# Patient Record
Sex: Female | Born: 1965
Health system: Southern US, Community
[De-identification: ages and names within clinical notes are randomized; demographics above are authoritative.]

## PROBLEM LIST (undated history)

## (undated) DIAGNOSIS — E119 Type 2 diabetes mellitus without complications: Secondary | ICD-10-CM

## (undated) DIAGNOSIS — I10 Essential (primary) hypertension: Secondary | ICD-10-CM

## (undated) HISTORY — PX: KNEE SURGERY: SHX244

## (undated) HISTORY — PX: TUBAL LIGATION: SHX77

---

## 1997-10-17 ENCOUNTER — Ambulatory Visit (HOSPITAL_COMMUNITY): Admission: RE | Admit: 1997-10-17 | Discharge: 1997-10-17 | Payer: Self-pay | Admitting: Cardiology

## 1997-11-23 ENCOUNTER — Other Ambulatory Visit: Admission: RE | Admit: 1997-11-23 | Discharge: 1997-11-23 | Payer: Self-pay | Admitting: Obstetrics

## 1997-12-25 ENCOUNTER — Emergency Department (HOSPITAL_COMMUNITY): Admission: EM | Admit: 1997-12-25 | Discharge: 1997-12-25 | Payer: Self-pay | Admitting: Emergency Medicine

## 1998-06-22 ENCOUNTER — Emergency Department (HOSPITAL_COMMUNITY): Admission: EM | Admit: 1998-06-22 | Discharge: 1998-06-22 | Payer: Self-pay | Admitting: Emergency Medicine

## 1998-06-24 ENCOUNTER — Encounter: Payer: Self-pay | Admitting: Emergency Medicine

## 1998-06-24 ENCOUNTER — Emergency Department (HOSPITAL_COMMUNITY): Admission: EM | Admit: 1998-06-24 | Discharge: 1998-06-24 | Payer: Self-pay | Admitting: Emergency Medicine

## 1998-12-05 ENCOUNTER — Encounter: Payer: Self-pay | Admitting: Emergency Medicine

## 1998-12-05 ENCOUNTER — Emergency Department (HOSPITAL_COMMUNITY): Admission: EM | Admit: 1998-12-05 | Discharge: 1998-12-05 | Payer: Self-pay | Admitting: Emergency Medicine

## 1999-01-02 ENCOUNTER — Ambulatory Visit (HOSPITAL_COMMUNITY): Admission: RE | Admit: 1999-01-02 | Discharge: 1999-01-02 | Payer: Self-pay | Admitting: Cardiology

## 1999-02-28 ENCOUNTER — Ambulatory Visit (HOSPITAL_COMMUNITY): Admission: RE | Admit: 1999-02-28 | Discharge: 1999-02-28 | Payer: Self-pay | Admitting: *Deleted

## 1999-03-21 ENCOUNTER — Other Ambulatory Visit: Admission: RE | Admit: 1999-03-21 | Discharge: 1999-03-21 | Payer: Self-pay | Admitting: Obstetrics

## 1999-03-31 ENCOUNTER — Emergency Department (HOSPITAL_COMMUNITY): Admission: EM | Admit: 1999-03-31 | Discharge: 1999-03-31 | Payer: Self-pay | Admitting: Internal Medicine

## 1999-04-07 ENCOUNTER — Emergency Department (HOSPITAL_COMMUNITY): Admission: EM | Admit: 1999-04-07 | Discharge: 1999-04-07 | Payer: Self-pay | Admitting: Emergency Medicine

## 1999-04-19 ENCOUNTER — Ambulatory Visit (HOSPITAL_COMMUNITY): Admission: RE | Admit: 1999-04-19 | Discharge: 1999-04-19 | Payer: Self-pay | Admitting: *Deleted

## 1999-05-09 ENCOUNTER — Encounter: Payer: Self-pay | Admitting: Emergency Medicine

## 1999-05-09 ENCOUNTER — Emergency Department (HOSPITAL_COMMUNITY): Admission: EM | Admit: 1999-05-09 | Discharge: 1999-05-09 | Payer: Self-pay | Admitting: Emergency Medicine

## 1999-07-29 ENCOUNTER — Emergency Department (HOSPITAL_COMMUNITY): Admission: EM | Admit: 1999-07-29 | Discharge: 1999-07-29 | Payer: Self-pay | Admitting: Emergency Medicine

## 2000-01-11 ENCOUNTER — Emergency Department (HOSPITAL_COMMUNITY): Admission: EM | Admit: 2000-01-11 | Discharge: 2000-01-11 | Payer: Self-pay | Admitting: Emergency Medicine

## 2000-06-10 ENCOUNTER — Other Ambulatory Visit: Admission: RE | Admit: 2000-06-10 | Discharge: 2000-06-10 | Payer: Self-pay | Admitting: Obstetrics

## 2001-01-04 ENCOUNTER — Emergency Department (HOSPITAL_COMMUNITY): Admission: EM | Admit: 2001-01-04 | Discharge: 2001-01-04 | Payer: Self-pay | Admitting: Emergency Medicine

## 2001-01-04 ENCOUNTER — Encounter: Payer: Self-pay | Admitting: Emergency Medicine

## 2001-02-05 ENCOUNTER — Emergency Department (HOSPITAL_COMMUNITY): Admission: EM | Admit: 2001-02-05 | Discharge: 2001-02-05 | Payer: Self-pay | Admitting: Emergency Medicine

## 2001-05-15 ENCOUNTER — Emergency Department (HOSPITAL_COMMUNITY): Admission: EM | Admit: 2001-05-15 | Discharge: 2001-05-16 | Payer: Self-pay | Admitting: Emergency Medicine

## 2001-05-16 ENCOUNTER — Encounter: Payer: Self-pay | Admitting: Emergency Medicine

## 2001-09-23 ENCOUNTER — Encounter: Payer: Self-pay | Admitting: Emergency Medicine

## 2001-09-23 ENCOUNTER — Emergency Department (HOSPITAL_COMMUNITY): Admission: EM | Admit: 2001-09-23 | Discharge: 2001-09-23 | Payer: Self-pay | Admitting: Emergency Medicine

## 2002-01-22 ENCOUNTER — Encounter: Payer: Self-pay | Admitting: Emergency Medicine

## 2002-01-22 ENCOUNTER — Inpatient Hospital Stay (HOSPITAL_COMMUNITY): Admission: AD | Admit: 2002-01-22 | Discharge: 2002-01-24 | Payer: Self-pay | Admitting: Obstetrics

## 2002-01-22 ENCOUNTER — Encounter: Payer: Self-pay | Admitting: Obstetrics

## 2004-03-25 ENCOUNTER — Encounter: Admission: RE | Admit: 2004-03-25 | Discharge: 2004-03-25 | Payer: Self-pay | Admitting: Orthopedic Surgery

## 2004-03-26 ENCOUNTER — Encounter: Admission: RE | Admit: 2004-03-26 | Discharge: 2004-03-26 | Payer: Self-pay | Admitting: Orthopedic Surgery

## 2004-04-18 ENCOUNTER — Encounter: Admission: RE | Admit: 2004-04-18 | Discharge: 2004-07-17 | Payer: Self-pay | Admitting: Orthopedic Surgery

## 2004-05-10 ENCOUNTER — Ambulatory Visit (HOSPITAL_COMMUNITY): Admission: RE | Admit: 2004-05-10 | Discharge: 2004-05-10 | Payer: Self-pay | Admitting: Physician Assistant

## 2004-05-29 ENCOUNTER — Emergency Department (HOSPITAL_COMMUNITY): Admission: EM | Admit: 2004-05-29 | Discharge: 2004-05-29 | Payer: Self-pay | Admitting: Emergency Medicine

## 2004-10-04 ENCOUNTER — Emergency Department (HOSPITAL_COMMUNITY): Admission: EM | Admit: 2004-10-04 | Discharge: 2004-10-04 | Payer: Self-pay | Admitting: Emergency Medicine

## 2004-11-23 ENCOUNTER — Emergency Department (HOSPITAL_COMMUNITY): Admission: EM | Admit: 2004-11-23 | Discharge: 2004-11-23 | Payer: Self-pay | Admitting: Emergency Medicine

## 2005-02-05 ENCOUNTER — Emergency Department (HOSPITAL_COMMUNITY): Admission: EM | Admit: 2005-02-05 | Discharge: 2005-02-05 | Payer: Self-pay | Admitting: Emergency Medicine

## 2005-03-05 ENCOUNTER — Encounter: Admission: RE | Admit: 2005-03-05 | Discharge: 2005-03-05 | Payer: Self-pay | Admitting: Cardiology

## 2006-02-20 ENCOUNTER — Emergency Department (HOSPITAL_COMMUNITY): Admission: EM | Admit: 2006-02-20 | Discharge: 2006-02-21 | Payer: Self-pay | Admitting: Emergency Medicine

## 2006-03-19 ENCOUNTER — Encounter: Admission: RE | Admit: 2006-03-19 | Discharge: 2006-03-19 | Payer: Self-pay | Admitting: Cardiology

## 2006-12-18 ENCOUNTER — Emergency Department (HOSPITAL_COMMUNITY): Admission: EM | Admit: 2006-12-18 | Discharge: 2006-12-18 | Payer: Self-pay | Admitting: Emergency Medicine

## 2007-06-22 ENCOUNTER — Emergency Department (HOSPITAL_COMMUNITY): Admission: EM | Admit: 2007-06-22 | Discharge: 2007-06-22 | Payer: Self-pay | Admitting: Emergency Medicine

## 2007-09-13 ENCOUNTER — Emergency Department (HOSPITAL_COMMUNITY): Admission: EM | Admit: 2007-09-13 | Discharge: 2007-09-13 | Payer: Self-pay | Admitting: Emergency Medicine

## 2008-05-19 ENCOUNTER — Emergency Department (HOSPITAL_COMMUNITY): Admission: EM | Admit: 2008-05-19 | Discharge: 2008-05-19 | Payer: Self-pay | Admitting: Emergency Medicine

## 2008-08-11 ENCOUNTER — Emergency Department (HOSPITAL_COMMUNITY): Admission: EM | Admit: 2008-08-11 | Discharge: 2008-08-11 | Payer: Self-pay | Admitting: Emergency Medicine

## 2010-01-10 ENCOUNTER — Emergency Department (HOSPITAL_COMMUNITY): Admission: EM | Admit: 2010-01-10 | Discharge: 2010-01-10 | Payer: Self-pay | Admitting: Family Medicine

## 2010-04-04 ENCOUNTER — Emergency Department (HOSPITAL_COMMUNITY): Admission: EM | Admit: 2010-04-04 | Discharge: 2010-04-04 | Payer: Self-pay | Admitting: Emergency Medicine

## 2010-08-04 ENCOUNTER — Encounter: Payer: Self-pay | Admitting: Orthopedic Surgery

## 2010-08-04 ENCOUNTER — Encounter: Payer: Self-pay | Admitting: Obstetrics

## 2010-08-04 ENCOUNTER — Encounter: Payer: Self-pay | Admitting: Cardiology

## 2010-09-26 LAB — URINALYSIS, ROUTINE W REFLEX MICROSCOPIC
Hgb urine dipstick: NEGATIVE
Nitrite: NEGATIVE
Protein, ur: NEGATIVE mg/dL
pH: 6 (ref 5.0–8.0)

## 2010-09-29 LAB — POCT RAPID STREP A (OFFICE): Streptococcus, Group A Screen (Direct): POSITIVE — AB

## 2010-10-15 ENCOUNTER — Emergency Department (HOSPITAL_COMMUNITY)
Admission: EM | Admit: 2010-10-15 | Discharge: 2010-10-15 | Disposition: A | Discharge status: Home / Self Care | Payer: Medicaid Other | Attending: Emergency Medicine | Admitting: Emergency Medicine

## 2010-10-15 DIAGNOSIS — R110 Nausea: Secondary | ICD-10-CM | POA: Insufficient documentation

## 2010-10-15 DIAGNOSIS — R51 Headache: Secondary | ICD-10-CM | POA: Insufficient documentation

## 2010-11-11 ENCOUNTER — Emergency Department (HOSPITAL_COMMUNITY)
Admission: EM | Admit: 2010-11-11 | Discharge: 2010-11-11 | Disposition: A | Discharge status: Home / Self Care | Payer: Medicaid Other | Attending: Emergency Medicine | Admitting: Emergency Medicine

## 2010-11-11 DIAGNOSIS — R10816 Epigastric abdominal tenderness: Secondary | ICD-10-CM | POA: Insufficient documentation

## 2010-11-11 DIAGNOSIS — R6883 Chills (without fever): Secondary | ICD-10-CM | POA: Insufficient documentation

## 2010-11-11 DIAGNOSIS — K5289 Other specified noninfective gastroenteritis and colitis: Secondary | ICD-10-CM | POA: Insufficient documentation

## 2010-11-11 LAB — COMPREHENSIVE METABOLIC PANEL
ALT: 23 U/L (ref 0–35)
Albumin: 3.7 g/dL (ref 3.5–5.2)
Alkaline Phosphatase: 52 U/L (ref 39–117)
Calcium: 9.1 mg/dL (ref 8.4–10.5)
Chloride: 105 mEq/L (ref 96–112)
Creatinine, Ser: 0.87 mg/dL (ref 0.4–1.2)
GFR calc non Af Amer: >60 mL/min (ref >60)
Potassium: 3.8 mEq/L (ref 3.5–5.1)
Total Bilirubin: 0.7 mg/dL (ref 0.3–1.2)

## 2010-11-11 LAB — CBC
MCH: 25.3 pg — ABNORMAL LOW (ref 26.0–34.0)
MCV: 77.3 fL — ABNORMAL LOW (ref 78.0–100.0)
Platelets: 252 10*3/uL (ref 150–400)
RBC: 4.59 MIL/uL (ref 3.87–5.11)
RDW: 14.9 % (ref 11.5–15.5)

## 2010-11-11 LAB — URINALYSIS, ROUTINE W REFLEX MICROSCOPIC
Bilirubin Urine: NEGATIVE
Glucose, UA: NEGATIVE mg/dL
Ketones, ur: NEGATIVE mg/dL
Nitrite: NEGATIVE
Protein, ur: NEGATIVE mg/dL
pH: 7.5 (ref 5.0–8.0)

## 2010-11-11 LAB — DIFFERENTIAL
Basophils Absolute: 0 10*3/uL (ref 0.0–0.1)
Eosinophils Absolute: 0 10*3/uL (ref 0.0–0.7)
Eosinophils Relative: 1 % (ref 0–5)
Lymphs Abs: 1 10*3/uL (ref 0.7–4.0)
Monocytes Absolute: 0.6 10*3/uL (ref 0.1–1.0)
Neutro Abs: 5.8 10*3/uL (ref 1.7–7.7)
Neutrophils Relative %: 79 % — ABNORMAL HIGH (ref 43–77)

## 2010-11-11 LAB — POCT PREGNANCY, URINE: Preg Test, Ur: NEGATIVE

## 2010-11-29 NOTE — Discharge Summary (Signed)
   NAMEALIZAYA, OSHEA NO.:  [id number]   MEDICAL RECORD NO.:  [id number]                   PATIENT TYPE:  INP   LOCATION:  9143                                 FACILITY:  [facility]   PHYSICIAN:  Kathreen Cosier, M.D.           DATE OF BIRTH:  1966/06/24   DATE OF ADMISSION:  01/22/2002  DATE OF DISCHARGE:  01/24/2002                                 DISCHARGE SUMMARY   The patient is a 45 year old female. Last normal period 11/08/01.  She had a  tubal ligation in the past.  She was admitted because of a temperature of  100.5 from 103.  She was started on Unasyn IV every 6 hours. She had  abdominal tenderness. She had a CT which was negative.  Ultrasound  suggesting possible endometritis. Appendicitis was ruled out.   Her hemoglobin was 10.8 on admission. White count 4.  Platelets 206. Sodium  140. Potassium 3.8.  Chloride 105.  Glucose 81.  CG and Chlamydia cultures  were negative.   She rapidly defervesced.  The abdomen became soft and she did well.  She was  discharged on 01/24/02 on Cleocin 300 mg p.o. q.6h. and Darvocet-N 100 mg  every 3-4 hours to see me in 1 week.   DISCHARGE DIAGNOSIS:  Status post endometritis.                                               Kathreen Cosier, M.D.    BAM/MEDQ  D:  03/09/2002  T:  03/10/2002  Job:  [id number]

## 2011-02-25 ENCOUNTER — Emergency Department (HOSPITAL_COMMUNITY)
Admission: EM | Admit: 2011-02-25 | Discharge: 2011-02-25 | Disposition: A | Discharge status: Home / Self Care | Payer: Medicaid Other | Attending: Emergency Medicine | Admitting: Emergency Medicine

## 2011-02-25 ENCOUNTER — Emergency Department (HOSPITAL_COMMUNITY): Payer: Medicaid Other

## 2011-02-25 DIAGNOSIS — R070 Pain in throat: Secondary | ICD-10-CM | POA: Insufficient documentation

## 2011-02-25 DIAGNOSIS — M542 Cervicalgia: Secondary | ICD-10-CM | POA: Insufficient documentation

## 2011-04-07 LAB — CBC
HCT: 33.7 — ABNORMAL LOW
Hemoglobin: 11.3 — ABNORMAL LOW
Platelets: 250
WBC: 5.4

## 2011-04-07 LAB — DIFFERENTIAL
Eosinophils Relative: 2
Lymphocytes Relative: 30
Lymphs Abs: 1.6
Monocytes Absolute: 0.8
Neutro Abs: 3

## 2011-04-07 LAB — BASIC METABOLIC PANEL
Calcium: 8.6
GFR calc non Af Amer: >60
Potassium: 3.1 — ABNORMAL LOW
Sodium: 136

## 2011-04-16 LAB — WET PREP, GENITAL

## 2011-04-16 LAB — DIFFERENTIAL
Basophils Absolute: 0
Basophils Relative: 0
Lymphocytes Relative: 32
Monocytes Absolute: 0.4
Neutro Abs: 2.9
Neutrophils Relative %: 58

## 2011-04-16 LAB — COMPREHENSIVE METABOLIC PANEL
Albumin: 3.6
Alkaline Phosphatase: 57
BUN: 7
Chloride: 106
Creatinine, Ser: 0.87
GFR calc non Af Amer: >60
Glucose, Bld: 89
Potassium: 3.5
Total Bilirubin: 0.9

## 2011-04-16 LAB — CBC
HCT: 32.4 — ABNORMAL LOW
Hemoglobin: 10.6 — ABNORMAL LOW
MCV: 77.1 — ABNORMAL LOW
Platelets: 263
WBC: 5

## 2011-04-16 LAB — URINE MICROSCOPIC-ADD ON

## 2011-04-16 LAB — PREGNANCY, URINE: Preg Test, Ur: NEGATIVE

## 2011-04-16 LAB — GC/CHLAMYDIA PROBE AMP, GENITAL: GC Probe Amp, Genital: NEGATIVE

## 2011-04-16 LAB — RPR: RPR Ser Ql: NONREACTIVE

## 2011-04-16 LAB — URINALYSIS, ROUTINE W REFLEX MICROSCOPIC
Glucose, UA: NEGATIVE
Ketones, ur: NEGATIVE
Protein, ur: NEGATIVE
Urobilinogen, UA: 0.2

## 2011-04-16 LAB — LIPASE, BLOOD: Lipase: 24

## 2011-05-01 LAB — GC/CHLAMYDIA PROBE AMP, GENITAL
Chlamydia, DNA Probe: NEGATIVE
GC Probe Amp, Genital: NEGATIVE

## 2011-05-01 LAB — BASIC METABOLIC PANEL
BUN: 3 — ABNORMAL LOW
CO2: 26
Calcium: 8.9
Chloride: 107
Creatinine, Ser: 0.89
GFR calc Af Amer: >60
GFR calc non Af Amer: >60
Glucose, Bld: 86
Potassium: 3.6
Sodium: 139

## 2011-05-01 LAB — URINALYSIS, ROUTINE W REFLEX MICROSCOPIC
Bilirubin Urine: NEGATIVE
Glucose, UA: NEGATIVE
Hgb urine dipstick: NEGATIVE
Ketones, ur: NEGATIVE
Nitrite: NEGATIVE
Protein, ur: NEGATIVE
Specific Gravity, Urine: 1.014
Urobilinogen, UA: 0.2
pH: 6

## 2011-05-01 LAB — DIFFERENTIAL
Basophils Absolute: 0
Basophils Relative: 1
Eosinophils Absolute: 0.1
Eosinophils Relative: 1
Lymphocytes Relative: 31
Lymphs Abs: 1.8
Monocytes Absolute: 0.6
Monocytes Relative: 11
Neutro Abs: 3.4
Neutrophils Relative %: 58

## 2011-05-01 LAB — RPR: RPR Ser Ql: NONREACTIVE

## 2011-05-01 LAB — CBC
HCT: 31.6 — ABNORMAL LOW
Hemoglobin: 10.4 — ABNORMAL LOW
MCHC: 33
MCV: 77.1 — ABNORMAL LOW
Platelets: 265
RBC: 4.11
RDW: 15.8 — ABNORMAL HIGH
WBC: 6

## 2011-05-01 LAB — PREGNANCY, URINE: Preg Test, Ur: NEGATIVE

## 2011-05-01 LAB — WET PREP, GENITAL
Trich, Wet Prep: NONE SEEN
Yeast Wet Prep HPF POC: NONE SEEN

## 2011-09-06 ENCOUNTER — Encounter (HOSPITAL_COMMUNITY): Payer: Self-pay | Admitting: Emergency Medicine

## 2011-09-06 ENCOUNTER — Emergency Department (HOSPITAL_COMMUNITY)
Admission: EM | Admit: 2011-09-06 | Discharge: 2011-09-07 | Disposition: A | Discharge status: Home / Self Care | Payer: Medicaid Other | Attending: Emergency Medicine | Admitting: Emergency Medicine

## 2011-09-06 DIAGNOSIS — T169XXA Foreign body in ear, unspecified ear, initial encounter: Secondary | ICD-10-CM | POA: Insufficient documentation

## 2011-09-06 DIAGNOSIS — IMO0002 Reserved for concepts with insufficient information to code with codable children: Secondary | ICD-10-CM | POA: Insufficient documentation

## 2011-09-06 MED ORDER — ANTIPYRINE-BENZOCAINE 5.4-1.4 % OT SOLN: 3.0000 [drp] | Freq: Once | OTIC | Status: DC

## 2011-09-06 NOTE — ED Notes (Signed)
Pt began having pain to L ear about 30 min ago, insect visible with otoscope. Small amount blood noted

## 2011-09-06 NOTE — ED Notes (Signed)
L ear irrigated with sterile water, @100mls . Pt tolerated well. Insect did not appear to move.

## 2011-09-07 MED ORDER — LIDOCAINE HCL 2 % IJ SOLN
INTRAMUSCULAR | Status: AC
  Administered 2011-09-07: 01:00:00
  Filled 2011-09-07: qty 1

## 2011-09-07 MED ORDER — NEOMYCIN-POLYMYXIN-HC 3.5-10000-1 OT SUSP: 4.0000 [drp] | Freq: Three times a day (TID) | OTIC | Status: AC

## 2011-09-07 MED ORDER — ANTIPYRINE-BENZOCAINE 5.4-1.4 % OT SOLN: 3.0000 [drp] | OTIC | Status: AC | PRN

## 2011-09-07 NOTE — ED Provider Notes (Signed)
History     CSN: [id number]  Arrival date & time 09/06/11  2212   First MD Initiated Contact with Patient 09/06/11 2255      Chief Complaint  Patient presents with  . Foreign Body in Ear    (Consider location/radiation/quality/duration/timing/severity/associated sxs/prior treatment) HPI  Pt came to the ED with complaints of cockroach in ear. She was ate her grandmothers house helping her move and she was too tired to go home and laid down. She has been trying to get it out herself and has scratched her ear and is now having a lot of pain.  Past Medical History  Diagnosis Date  . Asthma     Past Surgical History  Procedure Date  . Tubal ligation   . Knee surgery     No family history on file.  History  Substance Use Topics  . Smoking status: Never Smoker   . Smokeless tobacco: Not on file  . Alcohol Use: Yes     occasional    OB History    Grav Para Term Preterm Abortions TAB SAB Ect Mult Living                  Review of Systems  All other systems reviewed and are negative.    Allergies  Codeine and Toradol  Home Medications   Current Outpatient Rx  Name Route Sig Dispense Refill  . ANTIPYRINE-BENZOCAINE 5.4-1.4 % OT SOLN Left Ear Place 3 drops into the left ear every 2 (two) hours as needed for pain. 10 mL 0  . NEOMYCIN-POLYMYXIN-HC 3.5-10000-1 OT SUSP Left Ear Place 4 drops into the left ear 3 (three) times daily. 7.5 mL 0    BP 146/81  Pulse 83  Temp(Src) 98.6 F (37 C) (Oral)  Resp 18  Wt 260 lb (117.935 kg)  SpO2 100%  LMP 07/22/2011  Physical Exam  Nursing note and vitals reviewed. Constitutional: She appears well-developed and well-nourished. No distress.  HENT:  Head: Normocephalic and atraumatic.  Right Ear: Hearing, tympanic membrane and external ear normal.  Left Ear: No lacerations. There is swelling and tenderness. A foreign body is present. Tympanic membrane is not injected, not perforated and not retracted. Left ear middle  ear effusion: cockroach against eardrum. Decreased hearing is noted.  Eyes: Pupils are equal, round, and reactive to light.  Neck: Normal range of motion. Neck supple.  Cardiovascular: Normal rate and regular rhythm.   Pulmonary/Chest: Effort normal.  Abdominal: Soft.  Neurological: She is alert.  Skin: Skin is warm and dry.    ED Course  FOREIGN BODY REMOVAL Date/Time: 09/07/2011 12:32 AM Performed by: Dorthula Matas Authorized by: Dorthula Matas Consent: Verbal consent obtained. Risks and benefits: risks, benefits and alternatives were discussed Consent given by: patient Patient understanding: patient states understanding of the procedure being performed Patient identity confirmed: verbally with patient Body area: ear Anesthesia method: topical. Local anesthetic: lidocaine 2% without epinephrine Anesthetic total: 3 ml Patient sedated: no Patient restrained: no 1 objects recovered. Objects recovered: cockroach Post-procedure assessment: foreign body removed   (including critical care time)  Labs Reviewed - No data to display No results found.   1. Foreign body in ear       MDM   Cockroach removed. I have given patient cortisporin drops and auralgan drops. Pt also given referral to ENT Dr. Annalee Genta in case she develops any complications.       Dorthula Matas, PA 09/07/11 [phone]

## 2011-09-07 NOTE — Discharge Instructions (Signed)
Ear, Foreign Body  It is common for young children to put objects into the ear canal. These may include pebbles, beads, beans, and any other small objects which will fit. In adults, objects such as cotton swabs used to relieve itching or soreness in the ear may become lodged (get stuck in) in the ear canal. In all ages, the most common foreign bodies are insects that enter the ear canal. Foreign bodies may cause pain, buzzing or roaring sounds, hearing loss, and ear drainage.   HOME CARE INSTRUCTIONS   · Once the caregiver removes the object from the ear, there are usually no further problems.   · Keep small objects out of reach of young children. Tell your children not to put object into ears, and that if it happens again, to tell you or another adult immediately.   SEEK IMMEDIATE MEDICAL CARE IF:   · There is bleeding from the ear or increased pain and swelling.   · There is reduced hearing.   · Pain and discharge from the ear continues. This may be a sign of infection or may suggest that the object was not completely removed.   · Another small object gets stuck in the ear canal. Do not try to remove it with tweezers, a finger, or stick anything into the ear. Such actions may cause the object to be pushed farther in and make it more difficult to remove.   · Headache, fever, or other serious problems develop.   MAKE SURE YOU:   · Understand these instructions.   · Will watch your condition.   · Will get help right away if you are not doing well or get worse.   Document Released: 06/27/2000 Document Revised: 03/12/2011 Document Reviewed: 02/16/2008  [employer]® Patient Information ©2012 [employer].

## 2011-09-07 NOTE — ED Provider Notes (Signed)
Medical screening examination/treatment/procedure(s) were performed by non-physician practitioner and as supervising physician I was immediately available for consultation/collaboration.  Juliet Rude. Rubin Payor, MD 09/07/11 [phone]

## 2011-10-20 ENCOUNTER — Encounter (HOSPITAL_COMMUNITY): Payer: Self-pay

## 2011-10-20 ENCOUNTER — Emergency Department (HOSPITAL_COMMUNITY)
Admission: EM | Admit: 2011-10-20 | Discharge: 2011-10-20 | Disposition: A | Discharge status: Home / Self Care | Payer: Medicaid Other | Attending: Emergency Medicine | Admitting: Emergency Medicine

## 2011-10-20 DIAGNOSIS — R10819 Abdominal tenderness, unspecified site: Secondary | ICD-10-CM | POA: Insufficient documentation

## 2011-10-20 DIAGNOSIS — R197 Diarrhea, unspecified: Secondary | ICD-10-CM | POA: Insufficient documentation

## 2011-10-20 DIAGNOSIS — R112 Nausea with vomiting, unspecified: Secondary | ICD-10-CM | POA: Insufficient documentation

## 2011-10-20 DIAGNOSIS — R109 Unspecified abdominal pain: Secondary | ICD-10-CM | POA: Insufficient documentation

## 2011-10-20 HISTORY — DX: Essential (primary) hypertension: I10

## 2011-10-20 LAB — DIFFERENTIAL
Lymphocytes Relative: 11 % — ABNORMAL LOW (ref 12–46)
Lymphs Abs: 0.7 10*3/uL (ref 0.7–4.0)
Monocytes Relative: 6 % (ref 3–12)
Neutro Abs: 4.9 10*3/uL (ref 1.7–7.7)
Neutrophils Relative %: 82 % — ABNORMAL HIGH (ref 43–77)

## 2011-10-20 LAB — COMPREHENSIVE METABOLIC PANEL
ALT: 15 U/L (ref 0–35)
Alkaline Phosphatase: 56 U/L (ref 39–117)
BUN: 7 mg/dL (ref 6–23)
Chloride: 104 mEq/L (ref 96–112)
GFR calc Af Amer: >90 mL/min (ref >90)
Glucose, Bld: 124 mg/dL — ABNORMAL HIGH (ref 70–99)
Potassium: 3.4 mEq/L — ABNORMAL LOW (ref 3.5–5.1)
Sodium: 139 mEq/L (ref 135–145)
Total Bilirubin: 0.7 mg/dL (ref 0.3–1.2)

## 2011-10-20 LAB — URINALYSIS, ROUTINE W REFLEX MICROSCOPIC
Bilirubin Urine: NEGATIVE
Hgb urine dipstick: NEGATIVE
Ketones, ur: NEGATIVE mg/dL
Nitrite: NEGATIVE
Urobilinogen, UA: 0.2 mg/dL (ref 0.0–1.0)
pH: 8 (ref 5.0–8.0)

## 2011-10-20 LAB — CBC
Hemoglobin: 11.7 g/dL — ABNORMAL LOW (ref 12.0–15.0)
MCH: 23.8 pg — ABNORMAL LOW (ref 26.0–34.0)
RBC: 4.92 MIL/uL (ref 3.87–5.11)
WBC: 6 10*3/uL (ref 4.0–10.5)

## 2011-10-20 LAB — LIPASE, BLOOD: Lipase: 19 U/L (ref 11–59)

## 2011-10-20 MED ORDER — ONDANSETRON HCL 4 MG/2ML IJ SOLN
4.0000 mg | Freq: Once | INTRAMUSCULAR | Status: AC
  Administered 2011-10-20: 4 mg via INTRAVENOUS
  Filled 2011-10-20: qty 2

## 2011-10-20 MED ORDER — MORPHINE SULFATE 4 MG/ML IJ SOLN
4.0000 mg | Freq: Once | INTRAMUSCULAR | Status: AC
  Administered 2011-10-20: 4 mg via INTRAVENOUS
  Filled 2011-10-20: qty 1

## 2011-10-20 MED ORDER — HYOSCYAMINE SULFATE 0.125 MG PO TABS
0.2500 mg | ORAL_TABLET | Freq: Once | ORAL | Status: AC
  Administered 2011-10-20: 0.25 mg via ORAL
  Filled 2011-10-20: qty 2

## 2011-10-20 MED ORDER — PROMETHAZINE HCL 12.5 MG PO TABS: 12.5000 mg | ORAL_TABLET | Freq: Four times a day (QID) | ORAL | Status: DC | PRN

## 2011-10-20 MED ORDER — SODIUM CHLORIDE 0.9 % IV BOLUS (SEPSIS)
1000.0000 mL | Freq: Once | INTRAVENOUS | Status: AC
  Administered 2011-10-20: 1000 mL via INTRAVENOUS

## 2011-10-20 NOTE — ED Notes (Signed)
Pt given po fluids tolerating well thus far

## 2011-10-20 NOTE — ED Notes (Signed)
Pt. Stated she was unable to urinate

## 2011-10-20 NOTE — ED Notes (Signed)
Pt. Was put in a gown ,on monitor

## 2011-10-20 NOTE — ED Notes (Signed)
Pt reports that she is having nausea/vomitting/diarrhea since last night at 1900.  Abdominal pain around belly button, describes aching and sharp pains.

## 2011-10-20 NOTE — Discharge Instructions (Signed)
I think your symptoms are caused by a viral infection such as noro virus. Make sure to drink plenty of fluids. Rest. Take phenergan as prescribed for nausea. Follow up with your doctor in 2-3 days. Return if abdominal pain is worsening or unable to tolerate oral fluids and medications.   Norovirus Infection Norovirus illness is caused by a viral infection. The term norovirus refers to a group of viruses. Any of those viruses can cause norovirus illness. This illness is often referred to by other names such as viral gastroenteritis, stomach flu, and food poisoning. Anyone can get a norovirus infection. People can have the illness multiple times during their lifetime. CAUSES  Norovirus is found in the stool or vomit of infected people. It is easily spread from person to person (contagious). People with norovirus are contagious from the moment they begin feeling ill. They may remain contagious for as long as 3 days to 2 weeks after recovery. People can become infected with the virus in several ways. This includes:  Eating food or drinking liquids that are contaminated with norovirus.   Touching surfaces or objects contaminated with norovirus, and then placing your hand in your mouth.   Having direct contact with a person who is infected and shows symptoms. This may occur while caring for someone with illness or while sharing foods or eating utensils with someone who is ill.  SYMPTOMS  Symptoms usually begin 1 to 2 days after ingestion of the virus. Symptoms may include:  Nausea.   Vomiting.   Diarrhea.   Stomach cramps.   Low-grade fever.   Chills.   Headache.   Muscle aches.   Tiredness.  Most people with norovirus illness get better within 1 to 2 days. Some people become dehydrated because they cannot drink enough liquids to replace those lost from vomiting and diarrhea. This is especially true for young children, the elderly, and others who are unable to care for  themselves. DIAGNOSIS  Diagnosis is based on your symptoms and exam. Currently, only state public health laboratories have the ability to test for norovirus in stool or vomit. TREATMENT  No specific treatment exists for norovirus infections. No vaccine is available to prevent infections. Norovirus illness is usually brief in healthy people. If you are ill with vomiting and diarrhea, you should drink enough water and fluids to keep your urine clear or pale yellow. Dehydration is the most serious health effect that can result from this infection. By drinking oral rehydration solution (ORS), people can reduce their chance of becoming dehydrated. There are many commercially available pre-made and powdered ORS designed to safely rehydrate people. These may be recommended by your caregiver. Replace any new fluid losses from diarrhea or vomiting with ORS as follows:  If your child weighs 10 kg or less (22 lb or less), give 60 to 120 ml ( to  cup or 2 to 4 oz) of ORS for each diarrheal stool or vomiting episode.   If your child weighs more than 10 kg (more than 22 lb), give 120 to 240 ml ( to 1 cup or 4 to 8 oz) of ORS for each diarrheal stool or vomiting episode.  HOME CARE INSTRUCTIONS   Follow all your caregiver's instructions.   Avoid sugar-free and alcoholic drinks while ill.   Only take over-the-counter or prescription medicines for pain, vomiting, diarrhea, or fever as directed by your caregiver.  You can decrease your chances of coming in contact with norovirus or spreading it by following these steps:  Frequently wash your hands, especially after using the toilet, changing diapers, and before eating or preparing food.   Carefully wash fruits and vegetables. Cook shellfish before eating them.   Do not prepare food for others while you are infected and for at least 3 days after recovering from illness.   Thoroughly clean and disinfect contaminated surfaces immediately after an episode of  illness using a bleach-based household cleaner.   Immediately remove and wash clothing or linens that may be contaminated with the virus.   Use the toilet to dispose of any vomit or stool. Make sure the surrounding area is kept clean.   Food that may have been contaminated by an ill person should be discarded.  SEEK IMMEDIATE MEDICAL CARE IF:   You develop symptoms of dehydration that do not improve with fluid replacement. This may include:   Excessive sleepiness.   Lack of tears.   Dry mouth.   Dizziness when standing.   Weak pulse.  Document Released: 09/20/2002 Document Revised: 06/19/2011 Document Reviewed: 10/22/2009 [facility] Patient Information 2012 [place], [place].

## 2011-10-20 NOTE — ED Provider Notes (Signed)
History     CSN: [id number]  Arrival date & time 10/20/11  8469   First MD Initiated Contact with Patient 10/20/11 0559      Chief Complaint  Patient presents with  . Vomiting    (Consider location/radiation/quality/duration/timing/severity/associated sxs/prior treatment) Patient is a 46 y.o. female presenting with vomiting. The history is provided by the patient.  Emesis  This is a new problem. The current episode started 6 to 12 hours ago. Associated symptoms include abdominal pain and diarrhea. Pertinent negatives include no chills and no fever.  Pt states around 7pm last night, she began having abdominal cramping, nausea, vomiting. States around 9pm, developed watery diarrhea. Pt states She has been vomiting all night, unable to keep anything down. States tried taking tylenol, but vomited. Continues to have abdominal pain, states pain is mainly around her umbilicus and epigastric area. Denies fever, admits to chills. States feels weak and dizzy. No hx of the same. No recent sick contacts  Past Medical History  Diagnosis Date  . Asthma   . Hypertension     Past Surgical History  Procedure Date  . Tubal ligation   . Knee surgery     No family history on file.  History  Substance Use Topics  . Smoking status: Never Smoker   . Smokeless tobacco: Not on file  . Alcohol Use: Yes     occasional    OB History    Grav Para Term Preterm Abortions TAB SAB Ect Mult Living                  Review of Systems  Constitutional: Negative for fever and chills.  HENT: Negative.   Eyes: Negative.   Respiratory: Negative.   Cardiovascular: Negative.   Gastrointestinal: Positive for nausea, abdominal pain and diarrhea.  Genitourinary: Negative.   Musculoskeletal: Negative.   Skin: Negative.   Neurological: Negative.   Psychiatric/Behavioral: Negative.     Allergies  Codeine and Toradol  Home Medications  No current outpatient prescriptions on file.  BP 152/104  Pulse  87  Temp(Src) 98.1 F (36.7 C) (Oral)  Resp 19  Ht 5\' 5"  (1.651 m)  Wt 225 lb (102.059 kg)  BMI 37.44 kg/m2  SpO2 100%  LMP 09/26/2011  Physical Exam  Nursing note and vitals reviewed. Constitutional: She is oriented to person, place, and time. She appears well-developed and well-nourished.       Obese, vomiting  HENT:  Head: Normocephalic.  Eyes: Conjunctivae are normal.  Neck: Neck supple.  Cardiovascular: Normal rate, regular rhythm and normal heart sounds.   Pulmonary/Chest: Effort normal and breath sounds normal. No respiratory distress. She has no wheezes. She has no rales.  Abdominal: Soft. Bowel sounds are normal. There is tenderness.       Tender in all four quadrants. No guarding or rebound tenderness  Musculoskeletal: She exhibits no edema.  Neurological: She is alert and oriented to person, place, and time.  Skin: Skin is warm and dry.  Psychiatric: She has a normal mood and affect.    ED Course  Procedures (including critical care time)  Pt with abdominal pain, nausea, vomiting, diarrhea. Will get labs. Abdomen diffusely tender, soft, no guarding. Will hydrate, zofran ordered.   Results for orders placed during the hospital encounter of 10/20/11  CBC      Component Value Range   WBC 6.0  4.0 - 10.5 (K/uL)   RBC 4.92  3.87 - 5.11 (MIL/uL)   Hemoglobin 11.7 (*) 12.0 -  15.0 (g/dL)   HCT 13.0 (*) 86.5 - 46.0 (%)   MCV 72.8 (*) 78.0 - 100.0 (fL)   MCH 23.8 (*) 26.0 - 34.0 (pg)   MCHC 32.7  30.0 - 36.0 (g/dL)   RDW 78.4  69.6 - 29.5 (%)   Platelets 229  150 - 400 (K/uL)  DIFFERENTIAL      Component Value Range   Neutrophils Relative 82 (*) 43 - 77 (%)   Neutro Abs 4.9  1.7 - 7.7 (K/uL)   Lymphocytes Relative 11 (*) 12 - 46 (%)   Lymphs Abs 0.7  0.7 - 4.0 (K/uL)   Monocytes Relative 6  3 - 12 (%)   Monocytes Absolute 0.4  0.1 - 1.0 (K/uL)   Eosinophils Relative 0  0 - 5 (%)   Eosinophils Absolute 0.0  0.0 - 0.7 (K/uL)   Basophils Relative 0  0 - 1 (%)    Basophils Absolute 0.0  0.0 - 0.1 (K/uL)  COMPREHENSIVE METABOLIC PANEL      Component Value Range   Sodium 139  135 - 145 (mEq/L)   Potassium 3.4 (*) 3.5 - 5.1 (mEq/L)   Chloride 104  96 - 112 (mEq/L)   CO2 26  19 - 32 (mEq/L)   Glucose, Bld 124 (*) 70 - 99 (mg/dL)   BUN 7  6 - 23 (mg/dL)   Creatinine, Ser 2.84  0.50 - 1.10 (mg/dL)   Calcium 9.5  8.4 - 13.2 (mg/dL)   Total Protein 8.4 (*) 6.0 - 8.3 (g/dL)   Albumin 4.0  3.5 - 5.2 (g/dL)   AST 20  0 - 37 (U/L)   ALT 15  0 - 35 (U/L)   Alkaline Phosphatase 56  39 - 117 (U/L)   Total Bilirubin 0.7  0.3 - 1.2 (mg/dL)   GFR calc non Af Amer 78 (*) >90 (mL/min)   GFR calc Af Amer >90  >90 (mL/min)  LIPASE, BLOOD      Component Value Range   Lipase 19  11 - 59 (U/L)  URINALYSIS, ROUTINE W REFLEX MICROSCOPIC      Component Value Range   Color, Urine YELLOW  YELLOW    APPearance CLOUDY (*) CLEAR    Specific Gravity, Urine 1.017  1.005 - 1.030    pH 8.0  5.0 - 8.0    Glucose, UA NEGATIVE  NEGATIVE (mg/dL)   Hgb urine dipstick NEGATIVE  NEGATIVE    Bilirubin Urine NEGATIVE  NEGATIVE    Ketones, ur NEGATIVE  NEGATIVE (mg/dL)   Protein, ur NEGATIVE  NEGATIVE (mg/dL)   Urobilinogen, UA 0.2  0.0 - 1.0 (mg/dL)   Nitrite NEGATIVE  NEGATIVE    Leukocytes, UA NEGATIVE  NEGATIVE   PREGNANCY, URINE      Component Value Range   Preg Test, Ur NEGATIVE  NEGATIVE    No results found.  Las unremarkable other than  Potassium of 3.4, glu 124. Pt feeling better with IV fluids, zofran. Her abdominal pain resolved, non tender. I do not think this pt has any peritoneal signs or acute abdomen. Possible gastroenteritis. SHe is tolerating POs. Wants to go home. Will d/c home with follow up. Instructed to return if worsening.     No diagnosis found.    MDM          Lottie Mussel, PA 10/20/11 1134

## 2011-10-22 NOTE — ED Provider Notes (Signed)
Medical screening examination/treatment/procedure(s) were performed by non-physician practitioner and as supervising physician I was immediately available for consultation/collaboration.   Leahann Lempke D Kerilyn Cortner, MD 10/22/11 0752 

## 2011-11-16 ENCOUNTER — Emergency Department (HOSPITAL_COMMUNITY)
Admission: EM | Admit: 2011-11-16 | Discharge: 2011-11-16 | Disposition: A | Discharge status: Home / Self Care | Payer: Medicaid Other | Attending: Emergency Medicine | Admitting: Emergency Medicine

## 2011-11-16 ENCOUNTER — Encounter (HOSPITAL_COMMUNITY): Payer: Self-pay

## 2011-11-16 ENCOUNTER — Emergency Department (HOSPITAL_COMMUNITY): Payer: Medicaid Other

## 2011-11-16 DIAGNOSIS — S8990XA Unspecified injury of unspecified lower leg, initial encounter: Secondary | ICD-10-CM | POA: Insufficient documentation

## 2011-11-16 DIAGNOSIS — I10 Essential (primary) hypertension: Secondary | ICD-10-CM | POA: Insufficient documentation

## 2011-11-16 DIAGNOSIS — W108XXA Fall (on) (from) other stairs and steps, initial encounter: Secondary | ICD-10-CM | POA: Insufficient documentation

## 2011-11-16 DIAGNOSIS — M25569 Pain in unspecified knee: Secondary | ICD-10-CM | POA: Insufficient documentation

## 2011-11-16 DIAGNOSIS — M25469 Effusion, unspecified knee: Secondary | ICD-10-CM | POA: Insufficient documentation

## 2011-11-16 DIAGNOSIS — J45909 Unspecified asthma, uncomplicated: Secondary | ICD-10-CM | POA: Insufficient documentation

## 2011-11-16 MED ORDER — NAPROXEN 500 MG PO TABS: 500.0000 mg | ORAL_TABLET | Freq: Two times a day (BID) | ORAL | Status: DC

## 2011-11-16 NOTE — ED Provider Notes (Signed)
History     CSN: [id number]  Arrival date & time 11/16/11  0940   First MD Initiated Contact with Patient 11/16/11 [phone]      Chief Complaint  Patient presents with  . Knee Pain    left    (Consider location/radiation/quality/duration/timing/severity/associated sxs/prior treatment) HPI Comments: 46 yo female with history of knee surgery x 2 on the left knee presents today after falling down 15 stairs and injuring the left knee. She struck the medial aspect of the knee.  States pain feels like a tearing every time she moves the lower leg. Reports swelling of the knee.  Denies bruising.  Denies striking her head, head or neck pain, loss of consciousness or other injuries.  Patient used aleve and ice for the injury with some temporary relief of pain.  Patient can walk and move knee but states it is painful. Denies tingling or numbness distal to the knee.  Patient is a 46 y.o. female presenting with knee pain. The history is provided by the patient.  Knee Pain This is a new problem. The current episode started in the past 7 days. The problem occurs constantly. The problem has been unchanged. Associated symptoms include arthralgias and joint swelling. Pertinent negatives include no fever, myalgias, neck pain, numbness or weakness. The symptoms are aggravated by walking (palpation). She has tried NSAIDs for the symptoms. The treatment provided moderate relief.    Past Medical History  Diagnosis Date  . Asthma   . Hypertension     Past Surgical History  Procedure Date  . Tubal ligation   . Knee surgery     History reviewed. No pertinent family history.  History  Substance Use Topics  . Smoking status: Never Smoker   . Smokeless tobacco: Not on file  . Alcohol Use: Yes     occasional    OB History    Grav Para Term Preterm Abortions TAB SAB Ect Mult Living                  Review of Systems  Constitutional: Negative for fever and activity change.  HENT: Negative for neck  pain.   Musculoskeletal: Positive for joint swelling and arthralgias. Negative for myalgias and back pain.  Skin: Negative for wound.  Neurological: Negative for dizziness, weakness and numbness.    Allergies  Codeine and Ketorolac tromethamine  Home Medications   Current Outpatient Rx  Name Route Sig Dispense Refill  . ANALGESIC BALM 15-15 % EX OINT Topical Apply 1 application topically as needed. Pain    . ALEVE PO Oral Take 1 tablet by mouth as needed. Pain      BP 138/97  Pulse 90  Temp(Src) 97.9 F (36.6 C) (Oral)  Resp 20  SpO2 97%  LMP 09/26/2011  Physical Exam  Nursing note and vitals reviewed. Constitutional: She appears well-developed and well-nourished. No distress.  HENT:  Head: Normocephalic and atraumatic.  Eyes: Pupils are equal, round, and reactive to light.  Neck: Normal range of motion. Neck supple.  Cardiovascular: Exam reveals no decreased pulses.   Pulses:      Dorsalis pedis pulses are 2+ on the right side, and 2+ on the left side.       Posterior tibial pulses are 2+ on the right side, and 2+ on the left side.  Musculoskeletal: She exhibits tenderness. She exhibits no edema.       Left hip: Normal.       Left knee: She exhibits normal range of  motion, no swelling and no effusion. tenderness found. Medial joint line tenderness noted. No lateral joint line and no patellar tendon tenderness noted.       Left ankle: Normal.       Left upper leg: Normal.       Left lower leg: Normal.       No tenderness with palpation of the tibial tuberosity, lateral joint line.  No pain with manipulation of the knee cap. Limited active flexion of the knee due to pain. Full active extension of the knee.  Full ROM of the ankle.  Pulses 2+ dorsalis pedis and posterior tibial.    Neurological: She is alert. No sensory deficit.       Motor, sensation, and vascular distal to the injury is fully intact.   Skin: Skin is warm and dry.  Psychiatric: She has a normal mood and  affect.    ED Course  Procedures (including critical care time)  Labs Reviewed - No data to display Dg Knee Complete 4 Views Left  11/16/2011  *RADIOLOGY REPORT*  Clinical Data: Pain after fall  LEFT KNEE - COMPLETE 4+ VIEW  Comparison: October 04, 2004  Findings: There is no evidence of fracture, dislocation, or subchondral lesion.  The joint compartments are maintained.  Mild osteophytosis is present.  A small suprapatellar effusion is suspected.  IMPRESSION: Osteophytosis and small suprapatellar effusion.  Original Report Authenticated By: Brandon Melnick, M.D.     1. Knee injury     10:42 AM Patient seen and examined. X-ray reviewed by myself. Patient refuses crutches. Will have ortho tech give patient knee sleeve for stability. Patient counseled on RICE protocol and follow-up with orthopedist if no improvement in one week.   Vital signs reviewed and are as follows: Filed Vitals:   11/16/11 0944  BP: 138/97  Pulse: 90  Temp: 97.9 F (36.6 C)  Resp: 20      MDM  Knee injury, pt ambulatory with pain. Xray neg. Conservative management indicated with ortho f/u if no improvement. Patient refuses crutches.         Renne Crigler, [place] 11/16/11 1051

## 2011-11-16 NOTE — Discharge Instructions (Signed)
Please read and follow all provided instructions.  Your diagnoses today include:  1. Knee injury     Tests performed today include:  An x-ray of your knee - does NOT show any broken bones  Vital signs. See below for your results today.   Medications prescribed:   Naproxen - anti-inflammatory pain medication  Do not exceed 500mg  naproxen every 12 hours, take with food  You have been prescribed an anti-inflammatory medication or NSAID. Take with food. Take smallest effective dose for the shortest duration needed for your pain. Stop taking if you experience stomach pain or vomiting.   Take any prescribed medications only as directed.  Home care instructions:   Follow any educational materials contained in this packet  Follow R.I.C.E. Protocol:  R - rest your injury   I  - use ice on injury without applying directly to skin  C - compress injury with bandage or splint  E - elevate the injury as much as possible  Follow-up instructions: Please follow-up with your primary care provider or the provided orthopedic (bone specialist) if you continue to have significant pain or trouble walking in 1 week. In this case you may have a severe sprain that requires further care.   If you do not have a primary care doctor -- see below for referral information.   Return instructions:   Please return if your toes are numb or tingling, appear gray or blue, or you have severe pain (also elevate leg and loosen splint or wrap)  Please return to the Emergency Department if you experience worsening symptoms.   Please return if you have any other emergent concerns.  Additional Information:  Your vital signs today were: BP 138/97  Pulse 90  Temp(Src) 97.9 F (36.6 C) (Oral)  Resp 20  SpO2 97%  LMP 09/26/2011 If your blood pressure (BP) was elevated above 135/85 this visit, please have this repeated by your doctor within one month. -------------- Your caregiver has diagnosed you as  suffering from an ankle sprain. Ankle sprain occurs when the ligaments that hold the ankle joint together are stretched or torn. It may take 4 to 6 weeks to heal.  For Activity: If prescribed crutches, use crutches with non-weight bearing for the first few days. Then, you may walk on your ankle as the pain allows, or as instructed. Start gradually with weight bearing on the affected ankle. Once you can walk pain free, then try jogging. When you can run forwards, then you can try moving side-to-side. If you cannot walk without crutches in one week, you need a re-check. -------------- No Primary Care Doctor Call [facility]  [phone] Other agencies that provide inexpensive medical care    [facility]  [phone]    [facility]  [phone]    [facility]  [phone]    [facility]  [phone]    [facility]  [phone]    [facility]  [phone] -------------- RESOURCE GUIDE:  Dental Problems  Patients with Medicaid: [facility] [phone]                                            [phone] [address] [employer] [address] Phone:  [phone]  Phone:  [phone]  If unable to pay or uninsured, contact:  [facility] or [facility]. to become qualified for the adult dental clinic.  Chronic Pain Problems Contact [facility]  [phone] Patients need to be referred by their primary care doctor.  Insufficient Money for Medicine Contact [employer]:  call "211" or [facility] [phone].  [facility]  [phone] [facility]  [phone] [facility]   [phone] (emergency services [phone])  Substance Abuse Resources [facility]  [phone] [facility] [phone] [facility] [phone] Floydene Flock  [phone] Residential & Outpatient Substance Abuse Program  [phone]  Abuse/Neglect [facility] Child Abuse Hotline 330-430-5159 [facility] Child Abuse Hotline [phone])[phone] (After Hours)  Emergency Shelter [facility] [phone]  Maternity Homes [facility] [phone] [facility] [phone]  [facility] Resources  [facility]     [employer]                          [facility] Dept. [address] [place]                       [address]      [address]  Blondell Reveal Phone:  [phone]                                   Phone:  [phone]                 Phone:  [phone]  [facility] Phone:  [phone]  [facility] Child Abuse Hotline 276-146-4264 [phone] (After Hours)

## 2011-11-16 NOTE — ED Provider Notes (Signed)
Medical screening examination/treatment/procedure(s) were performed by non-physician practitioner and as supervising physician I was immediately available for consultation/collaboration.   Celene Kras, MD 11/16/11 1057

## 2011-11-16 NOTE — ED Notes (Signed)
Pt in with left knee pain states fell 3 days ago no relief since states pain with ambulating

## 2012-02-11 ENCOUNTER — Emergency Department (HOSPITAL_COMMUNITY): Payer: Medicaid Other

## 2012-02-11 ENCOUNTER — Other Ambulatory Visit: Payer: Self-pay

## 2012-02-11 ENCOUNTER — Encounter (HOSPITAL_COMMUNITY): Payer: Self-pay | Admitting: Emergency Medicine

## 2012-02-11 ENCOUNTER — Emergency Department (HOSPITAL_COMMUNITY)
Admission: EM | Admit: 2012-02-11 | Discharge: 2012-02-11 | Disposition: A | Discharge status: Home / Self Care | Payer: Medicaid Other | Attending: Emergency Medicine | Admitting: Emergency Medicine

## 2012-02-11 DIAGNOSIS — R002 Palpitations: Secondary | ICD-10-CM | POA: Insufficient documentation

## 2012-02-11 DIAGNOSIS — R079 Chest pain, unspecified: Secondary | ICD-10-CM | POA: Insufficient documentation

## 2012-02-11 DIAGNOSIS — Z7982 Long term (current) use of aspirin: Secondary | ICD-10-CM | POA: Insufficient documentation

## 2012-02-11 DIAGNOSIS — J45909 Unspecified asthma, uncomplicated: Secondary | ICD-10-CM | POA: Insufficient documentation

## 2012-02-11 DIAGNOSIS — I10 Essential (primary) hypertension: Secondary | ICD-10-CM | POA: Insufficient documentation

## 2012-02-11 LAB — CBC WITH DIFFERENTIAL/PLATELET
Basophils Absolute: 0 10*3/uL (ref 0.0–0.1)
Basophils Relative: 0 % (ref 0–1)
Eosinophils Absolute: 0.1 10*3/uL (ref 0.0–0.7)
Eosinophils Relative: 2 % (ref 0–5)
HCT: 29.4 % — ABNORMAL LOW (ref 36.0–46.0)
Hemoglobin: 9.4 g/dL — ABNORMAL LOW (ref 12.0–15.0)
Lymphocytes Relative: 40 % (ref 12–46)
Lymphs Abs: 2.2 10*3/uL (ref 0.7–4.0)
MCH: 22.8 pg — ABNORMAL LOW (ref 26.0–34.0)
MCHC: 32 g/dL (ref 30.0–36.0)
MCV: 71.2 fL — ABNORMAL LOW (ref 78.0–100.0)
Monocytes Absolute: 0.5 10*3/uL (ref 0.1–1.0)
Monocytes Relative: 9 % (ref 3–12)
Neutro Abs: 2.7 10*3/uL (ref 1.7–7.7)
Neutrophils Relative %: 49 % (ref 43–77)
Platelets: 254 10*3/uL (ref 150–400)
RBC: 4.13 MIL/uL (ref 3.87–5.11)
RDW: 16 % — ABNORMAL HIGH (ref 11.5–15.5)
WBC: 5.4 10*3/uL (ref 4.0–10.5)

## 2012-02-11 LAB — COMPREHENSIVE METABOLIC PANEL
ALT: 15 U/L (ref 0–35)
AST: 18 U/L (ref 0–37)
Albumin: 3.7 g/dL (ref 3.5–5.2)
Alkaline Phosphatase: 51 U/L (ref 39–117)
BUN: 9 mg/dL (ref 6–23)
CO2: 27 mEq/L (ref 19–32)
Calcium: 9.1 mg/dL (ref 8.4–10.5)
Chloride: 103 mEq/L (ref 96–112)
Creatinine, Ser: 0.96 mg/dL (ref 0.50–1.10)
GFR calc Af Amer: 82 mL/min — ABNORMAL LOW (ref >90)
GFR calc non Af Amer: 71 mL/min — ABNORMAL LOW (ref >90)
Glucose, Bld: 96 mg/dL (ref 70–99)
Potassium: 3.8 mEq/L (ref 3.5–5.1)
Sodium: 138 mEq/L (ref 135–145)
Total Bilirubin: 0.3 mg/dL (ref 0.3–1.2)
Total Protein: 7.8 g/dL (ref 6.0–8.3)

## 2012-02-11 LAB — POCT I-STAT TROPONIN I: Troponin i, poc: 0 ng/mL (ref 0.00–0.08)

## 2012-02-11 MED ORDER — FAMOTIDINE 20 MG PO TABS: 20.0000 mg | ORAL_TABLET | Freq: Two times a day (BID) | ORAL | Status: DC

## 2012-02-11 NOTE — ED Notes (Signed)
Pt presenting to ed with c/o palpitations x 2 days with chest tightness. Pt states positive nausea no vomiting pt states positive shortness of breath. Pt states decreased appetite. Pt is alert and oriented at this time. Pt states she is seeing black spots. Pt states she does have some stress in her life. Pt denies pain that radiates into her arms or jaw.

## 2012-02-11 NOTE — ED Provider Notes (Signed)
History    46 year old female palpitations. Onset about 2 days ago. Relatively constant. Associated with sensation of chest tightness and burning in her lower sternal area. Mild intermittent nausea but no vomiting. Mild shortness of breath. No back pain. Multiple other additional complaints including poor appetite, and generalized fatigue and lower back pain. Patient with a history of hypertension and asthma, otherwise healthy. Denies history of blood clot. No unusual leg pain or swelling. Patient is a smoker. Patient denies any recent medication changes. She denies any drug use.  CSN: [id number]  Arrival date & time 02/11/12  1431   First MD Initiated Contact with Patient 02/11/12 1711      Chief Complaint  Patient presents with  . Chest Pain    x3 days  . Palpitations    (Consider location/radiation/quality/duration/timing/severity/associated sxs/prior treatment) HPI  Past Medical History  Diagnosis Date  . Asthma   . Hypertension     Past Surgical History  Procedure Date  . Tubal ligation   . Knee surgery     No family history on file.  History  Substance Use Topics  . Smoking status: Never Smoker   . Smokeless tobacco: Not on file  . Alcohol Use: No    OB History    Grav Para Term Preterm Abortions TAB SAB Ect Mult Living                  Review of Systems   Review of symptoms negative unless otherwise noted in HPI.   Allergies  Codeine and Ketorolac tromethamine  Home Medications   Current Outpatient Rx  Name Route Sig Dispense Refill  . ASPIRIN 81 MG PO CHEW Oral Chew 162 mg by mouth once.      BP 146/102  Pulse 73  Temp 98.8 F (37.1 C) (Oral)  SpO2 99%  LMP 02/07/2012  Physical Exam  Nursing note and vitals reviewed. Constitutional: She appears well-developed and well-nourished. No distress.  HENT:  Head: Normocephalic and atraumatic.  Eyes: Conjunctivae are normal. Pupils are equal, round, and reactive to light. Right eye exhibits no  discharge. Left eye exhibits no discharge.  Neck: Neck supple.  Cardiovascular: Normal rate, regular rhythm and normal heart sounds.  Exam reveals no gallop and no friction rub.   No murmur heard. Pulmonary/Chest: Effort normal and breath sounds normal. No respiratory distress. She exhibits no tenderness.  Abdominal: Soft. She exhibits no distension. There is no tenderness.  Musculoskeletal: She exhibits no edema and no tenderness.       Lower extremities symmetric as compared to each other. No calf tenderness. Negative Homan's. No palpable cords.   Neurological: She is alert.  Skin: Skin is warm and dry.  Psychiatric: She has a normal mood and affect. Her behavior is normal. Thought content normal.    ED Course  Procedures (including critical care time)  Labs Reviewed  COMPREHENSIVE METABOLIC PANEL - Abnormal; Notable for the following:    GFR calc non Af Amer 71 (*)     GFR calc Af Amer 82 (*)     All other components within normal limits  CBC WITH DIFFERENTIAL - Abnormal; Notable for the following:    Hemoglobin 9.4 (*)     HCT 29.4 (*)     MCV 71.2 (*)     MCH 22.8 (*)     RDW 16.0 (*)     All other components within normal limits  POCT I-STAT TROPONIN I   Dg Chest 2 View  02/11/2012  *  RADIOLOGY REPORT*  Clinical Data: Mid chest pain.  Nonsmoker.  History of hypertension and asthma.  CHEST - 2 VIEW  Comparison: 02/20/2006  Findings: The heart is mildly enlarged with prominent left ventricular contour.  No evidence for pulmonary edema.  There are no focal consolidations or pleural effusions. Visualized osseous structures have a normal appearance.  IMPRESSION: Cardiomegaly without pulmonary edema.  Original Report Authenticated By: Patterson Hammersmith, M.D.   EKG:  Rhythm: nsr Rate: 70 Axis: normal Intervals: normal ST segments: NS ST changes inferiorly and laterally   1. Palpitations       MDM  46 year old female with palpitations. EKG is a sinus rhythm with normal  intervals. Patient has remained in sinus rhythm on the monitor throughout her ED stay. Workup today is fairly unremarkable. Patient with multiple other complaints. Suspect that there may be some component of anxiety. Very low suspicion for ACS. Doubt infectious. Doubt pulmonary embolism. Feel patient is safe for discharge at this time. Return precautions were discussed. Outpatient followup.        Raeford Razor, MD 02/22/12 [phone]

## 2012-07-20 ENCOUNTER — Emergency Department (HOSPITAL_COMMUNITY)
Admission: EM | Admit: 2012-07-20 | Discharge: 2012-07-20 | Disposition: A | Discharge status: Home / Self Care | Payer: Medicaid Other | Attending: Emergency Medicine | Admitting: Emergency Medicine

## 2012-07-20 DIAGNOSIS — R110 Nausea: Secondary | ICD-10-CM | POA: Insufficient documentation

## 2012-07-20 DIAGNOSIS — R059 Cough, unspecified: Secondary | ICD-10-CM | POA: Insufficient documentation

## 2012-07-20 DIAGNOSIS — J45909 Unspecified asthma, uncomplicated: Secondary | ICD-10-CM | POA: Insufficient documentation

## 2012-07-20 DIAGNOSIS — J111 Influenza due to unidentified influenza virus with other respiratory manifestations: Secondary | ICD-10-CM | POA: Insufficient documentation

## 2012-07-20 DIAGNOSIS — I10 Essential (primary) hypertension: Secondary | ICD-10-CM | POA: Insufficient documentation

## 2012-07-20 DIAGNOSIS — R51 Headache: Secondary | ICD-10-CM | POA: Insufficient documentation

## 2012-07-20 DIAGNOSIS — R05 Cough: Secondary | ICD-10-CM | POA: Insufficient documentation

## 2012-07-20 DIAGNOSIS — R509 Fever, unspecified: Secondary | ICD-10-CM | POA: Insufficient documentation

## 2012-07-20 DIAGNOSIS — J029 Acute pharyngitis, unspecified: Secondary | ICD-10-CM | POA: Insufficient documentation

## 2012-07-20 DIAGNOSIS — H539 Unspecified visual disturbance: Secondary | ICD-10-CM | POA: Insufficient documentation

## 2012-07-20 DIAGNOSIS — Z79899 Other long term (current) drug therapy: Secondary | ICD-10-CM | POA: Insufficient documentation

## 2012-07-20 DIAGNOSIS — R1319 Other dysphagia: Secondary | ICD-10-CM | POA: Insufficient documentation

## 2012-07-20 MED ORDER — VERAPAMIL HCL ER 240 MG PO CP24: 240.0000 mg | ORAL_CAPSULE | Freq: Every day | ORAL | Status: DC

## 2012-07-20 MED ORDER — ALBUTEROL SULFATE HFA 108 (90 BASE) MCG/ACT IN AERS: 1.0000 | INHALATION_SPRAY | Freq: Four times a day (QID) | RESPIRATORY_TRACT | Status: DC | PRN

## 2012-07-20 MED ORDER — IBUPROFEN 200 MG PO TABS
600.0000 mg | ORAL_TABLET | Freq: Once | ORAL | Status: AC
  Administered 2012-07-20: 600 mg via ORAL
  Filled 2012-07-20: qty 3

## 2012-07-20 NOTE — ED Notes (Addendum)
Pt states that she has had cough, sore throat, URI symptoms since Saturday but has had L ear pain that hurts when she swallows all day today. Also reports feeling dizzy. Pt has a hx of hypertension and has not had her meds in approx. A year.

## 2012-07-20 NOTE — ED Notes (Signed)
Heather PA notified of high BP.

## 2012-07-20 NOTE — ED Provider Notes (Signed)
History  Scribed for Sheri Jacobson, PA-C/Stephen Kohut, MD, the patient was seen in room WTR7/WTR7. This chart was scribed by Candelaria Stagers. The patient's care started at 8:01 PM   CSN: [id number]  Arrival date & time 07/20/12  1749   First MD Initiated Contact with Patient 07/20/12 1922      Chief Complaint  Patient presents with  . Otalgia  . Cough     The history is provided by the patient. No language interpreter was used.   Sheri Park is a 47 y.o. female who presents to the Emergency Department complaining of left ear pain that started today.  Pt reports that she has also been experiencing headache, productive cough, sore throat, fever, and congestion that started four days ago.  Her cough is worse when lying down.  She reports that she has also been experiencing ringing and popping in the left ear.  She has a history of HTN and had been on Verapamil 240 mg  in the past.  She hasn't taken medication for h/o hypertension in about two years.  She has taken nyquil with some relief of her symptoms.  Pt has h/o asthma.   She ran out of her inhaler.  She did not receive the flu vaccine and has been exposed to people with the flu.    Past Medical History  Diagnosis Date  . Asthma   . Hypertension     Past Surgical History  Procedure Date  . Tubal ligation   . Knee surgery     No family history on file.  History  Substance Use Topics  . Smoking status: Never Smoker   . Smokeless tobacco: Not on file  . Alcohol Use: No    OB History    Grav Para Term Preterm Abortions TAB SAB Ect Mult Living                  Review of Systems  Constitutional: Positive for fever and chills.  HENT: Positive for ear pain (left ear pain), congestion, sore throat and trouble swallowing. Negative for rhinorrhea, drooling, neck pain, neck stiffness, voice change and sinus pressure.   Eyes: Positive for visual disturbance (blured vision, seeing spots).  Respiratory:  Positive for cough. Negative for shortness of breath and wheezing.   Gastrointestinal: Positive for nausea. Negative for vomiting, abdominal pain and diarrhea.  Skin: Negative for rash.  Neurological: Positive for headaches.  All other systems reviewed and are negative.    Allergies  Codeine and Ketorolac tromethamine  Home Medications   Current Outpatient Rx  Name  Route  Sig  Dispense  Refill  . ALBUTEROL SULFATE HFA 108 (90 BASE) MCG/ACT IN AERS   Inhalation   Inhale 2 puffs into the lungs every 6 (six) hours as needed.         . ASPIRIN 81 MG PO CHEW   Oral   Chew 162 mg by mouth once.         Marland Kitchen PSEUDOEPH-DOXYLAMINE-DM-APAP 60-7.12-10-998 MG/30ML PO LIQD   Oral   Take 30 mLs by mouth as needed. COLD SYMPT.           BP 189/100  Pulse 74  Temp 98.6 F (37 C) (Oral)  SpO2 100%  Physical Exam  Nursing note and vitals reviewed. Constitutional: She is oriented to person, place, and time. She appears well-developed and well-nourished. No distress.       Morbidly obese  HENT:  Head: Normocephalic and atraumatic.  Right Ear:  Tympanic membrane and ear canal normal.  Left Ear: Tympanic membrane and ear canal normal.  Mouth/Throat: Uvula is midline and mucous membranes are normal. Posterior oropharyngeal erythema present. No oropharyngeal exudate, posterior oropharyngeal edema or tonsillar abscesses.        Submandibular glands are palpable.  Sinuses are non tender.   Turbinates are pink, moist, and clear.  No blood.  Oropharynx with mild erythema.  No exudates  Eyes: EOM are normal. Pupils are equal, round, and reactive to light.  Neck: Normal range of motion. Neck supple. No tracheal deviation present.  Cardiovascular: Normal rate and regular rhythm.  Exam reveals no gallop and no friction rub.   No murmur heard. Pulmonary/Chest: Effort normal. No respiratory distress. She has no wheezes.       Diminished breath sounds bilaterally.    Musculoskeletal: Normal range  of motion.  Neurological: She is alert and oriented to person, place, and time.  Skin: Skin is warm and dry.  Psychiatric: She has a normal mood and affect. Her behavior is normal.    ED Course  Procedures   DIAGNOSTIC STUDIES: Oxygen Saturation is 100% on room air, normal by my interpretation.    COORDINATION OF CARE:     Labs Reviewed - No data to display No results found.   No diagnosis found.    MDM  Patient with symptoms consistent with influenza.  No signs of dehydration, tolerating PO's.  Lungs are clear. Due to patient's presentation and physical exam a chest x-ray was not ordered bc likely diagnosis of flu.  Discussed the cost versus benefit of Tamiflu treatment with the patient.  The patient understands that symptoms are greater than the recommended 48 hour window of treatment.  Patient will be discharged with instructions to orally hydrate, rest, and use over-the-counter medications such as anti-inflammatories ibuprofen and Aleve for muscle aches and Tylenol for fever.  Patient also given Rx for Verapamil for her HTN that she was on in the past and Albuterol inhaler.   I personally performed the services described in this documentation, which was scribed in my presence. The recorded information has been reviewed and is accurate.        Pascal Lux [place], PA-C 07/21/12 [phone]

## 2012-07-28 ENCOUNTER — Emergency Department (HOSPITAL_COMMUNITY)
Admission: EM | Admit: 2012-07-28 | Discharge: 2012-07-28 | Disposition: A | Discharge status: Home / Self Care | Payer: Medicaid Other | Attending: Emergency Medicine | Admitting: Emergency Medicine

## 2012-07-28 ENCOUNTER — Encounter (HOSPITAL_COMMUNITY): Payer: Self-pay | Admitting: Emergency Medicine

## 2012-07-28 DIAGNOSIS — Z79899 Other long term (current) drug therapy: Secondary | ICD-10-CM | POA: Insufficient documentation

## 2012-07-28 DIAGNOSIS — H9190 Unspecified hearing loss, unspecified ear: Secondary | ICD-10-CM | POA: Insufficient documentation

## 2012-07-28 DIAGNOSIS — E669 Obesity, unspecified: Secondary | ICD-10-CM | POA: Insufficient documentation

## 2012-07-28 DIAGNOSIS — I10 Essential (primary) hypertension: Secondary | ICD-10-CM | POA: Insufficient documentation

## 2012-07-28 DIAGNOSIS — H6090 Unspecified otitis externa, unspecified ear: Secondary | ICD-10-CM

## 2012-07-28 DIAGNOSIS — H60399 Other infective otitis externa, unspecified ear: Secondary | ICD-10-CM | POA: Insufficient documentation

## 2012-07-28 DIAGNOSIS — J45909 Unspecified asthma, uncomplicated: Secondary | ICD-10-CM | POA: Insufficient documentation

## 2012-07-28 MED ORDER — CIPROFLOXACIN-DEXAMETHASONE 0.3-0.1 % OT SUSP: 4.0000 [drp] | Freq: Two times a day (BID) | OTIC | Status: DC

## 2012-07-28 NOTE — ED Provider Notes (Signed)
History   This chart was scribed for Sheri Gourd, PA-C working with Sheri Booze, MD by [name], ED Scribe. This patient was seen in room WTR9/WTR9 and the patient's care was started at 1658.   CSN: [id number]  Arrival date & time 07/28/12  1605   First MD Initiated Contact with Patient 07/28/12 1628      Chief Complaint  Patient presents with  . Otalgia    The history is provided by the patient. No language interpreter was used.  Sheri Park is a 47 y.o. female who presents to the Emergency Department complaining of constant, gradually worsening left ear pain for the past week. She reports associated hearing loss and rates her ear pain 7/10. She states that she was seen in ED last week with cold symptoms and ear pain. She states that the cold symptoms have resolved, but the ear pain remains and has gradually worsened. She reports she was not given antibiotics at that time but has taken Motrin without relief. She denies any fevers, chills or ear discharge. She also reports having issues with her BP recently. She has a h/o HTN and takes Verapamil at night. She states that her physician last saw her in September and has an appointment next week. BP here in ED is 177/112. She states that she adds salt to all of her food in large amounts and drinks a large amount of caffeine daily. She denies smoking.   Past Medical History  Diagnosis Date  . Asthma   . Hypertension     Past Surgical History  Procedure Date  . Tubal ligation   . Knee surgery     History reviewed. No pertinent family history.  History  Substance Use Topics  . Smoking status: Never Smoker   . Smokeless tobacco: Not on file  . Alcohol Use: No    OB History    Grav Para Term Preterm Abortions TAB SAB Ect Mult Living                  Review of Systems  Constitutional: Negative for fever and chills.  HENT: Positive for hearing loss and ear pain. Negative for ear discharge.   All  other systems reviewed and are negative.    Allergies  Codeine and Ketorolac tromethamine  Home Medications   Current Outpatient Rx  Name  Route  Sig  Dispense  Refill  . ALBUTEROL SULFATE HFA 108 (90 BASE) MCG/ACT IN AERS   Inhalation   Inhale 2 puffs into the lungs every 6 (six) hours as needed. For shortness of breath.         . IBUPROFEN 200 MG PO TABS   Oral   Take 600 mg by mouth every 8 (eight) hours as needed. For pain.         Marland Kitchen VERAPAMIL HCL ER 240 MG PO CP24   Oral   Take 1 capsule (240 mg total) by mouth at bedtime.   30 capsule   0     BP 177/112  Pulse 72  Temp 98 F (36.7 C) (Oral)  Resp 16  SpO2 97%  LMP 06/13/2012  Physical Exam  Nursing note and vitals reviewed. Constitutional: She is oriented to person, place, and time. She appears well-developed. No distress.       Obese  HENT:  Head: Normocephalic and atraumatic.  Right Ear: External ear normal.  Left Ear: External ear normal.  Mouth/Throat: Oropharynx is clear and moist. No oropharyngeal exudate.  Left ear canal is erythematous and inflamed. Right ear canal is normal. TM's are normal bilaterally.   Eyes: EOM are normal.  Neck: Neck supple. No tracheal deviation present.  Cardiovascular: Normal rate, regular rhythm and normal heart sounds.   Pulmonary/Chest: Effort normal and breath sounds normal. No respiratory distress.  Musculoskeletal: Normal range of motion.  Neurological: She is alert and oriented to person, place, and time.  Skin: Skin is warm and dry.  Psychiatric: She has a normal mood and affect. Her behavior is normal.    ED Course  Procedures (including critical care time)  DIAGNOSTIC STUDIES: Oxygen Saturation is 97% on room air, normal by my interpretation.    COORDINATION OF CARE:  17:05-Discussed planned course of treatment with the patient including d/c home with antibiotic ear drops and Motrin for pain, who is agreeable at this time.   Labs Reviewed - No  data to display No results found.   1. Otitis externa   2. Hypertension       MDM  47 y/o female with otitis externa. Rx ciprodex eye drops. Advised washing pillow case daily. Infection care and precautions discussed. Also advised patient to cut salt out of diet along with caffeine. She has appt with her PCP next week to discuss blood pressure. She is currently on verapamil. Return precautions discussed. Patient states understanding of plan and is agreeable.     I personally performed the services described in this documentation, which was scribed in my presence. The recorded information has been reviewed and is accurate.      Trevor Mace, PA-C 07/28/12 1719

## 2012-07-28 NOTE — ED Provider Notes (Signed)
Medical screening examination/treatment/procedure(s) were performed by non-physician practitioner and as supervising physician I was immediately available for consultation/collaboration.   [name], MD 07/28/12 [phone]

## 2012-07-28 NOTE — ED Notes (Signed)
Pt c/o lt ear pain x 1 week.  States "it feels like its stopped up".  Pt has hx of high BP, takes verapamil but has not taken it today.

## 2012-12-22 ENCOUNTER — Emergency Department (HOSPITAL_COMMUNITY)
Admission: EM | Admit: 2012-12-22 | Discharge: 2012-12-22 | Disposition: A | Discharge status: Home / Self Care | Payer: Medicaid Other | Attending: Emergency Medicine | Admitting: Emergency Medicine

## 2012-12-22 DIAGNOSIS — I10 Essential (primary) hypertension: Secondary | ICD-10-CM | POA: Insufficient documentation

## 2012-12-22 DIAGNOSIS — Z79899 Other long term (current) drug therapy: Secondary | ICD-10-CM | POA: Insufficient documentation

## 2012-12-22 DIAGNOSIS — K0889 Other specified disorders of teeth and supporting structures: Secondary | ICD-10-CM

## 2012-12-22 DIAGNOSIS — K089 Disorder of teeth and supporting structures, unspecified: Secondary | ICD-10-CM | POA: Insufficient documentation

## 2012-12-22 DIAGNOSIS — K08409 Partial loss of teeth, unspecified cause, unspecified class: Secondary | ICD-10-CM

## 2012-12-22 DIAGNOSIS — IMO0002 Reserved for concepts with insufficient information to code with codable children: Secondary | ICD-10-CM | POA: Insufficient documentation

## 2012-12-22 DIAGNOSIS — J45909 Unspecified asthma, uncomplicated: Secondary | ICD-10-CM | POA: Insufficient documentation

## 2012-12-22 MED ORDER — HYDROMORPHONE HCL PF 1 MG/ML IJ SOLN
1.0000 mg | Freq: Once | INTRAMUSCULAR | Status: AC
  Administered 2012-12-22: 1 mg via INTRAMUSCULAR
  Filled 2012-12-22: qty 1

## 2012-12-22 NOTE — ED Notes (Signed)
Pt states she just had 4 teeth pulled today and is having pain and her mouth is still bleeding. Pt states she has taken Tylenol #3 as directed for pain, but it's not helping. Pt with no acute distress. Pt states she is able to swallow. Pt ambulatory to exam room with steady gait. Pt arrives with family member.

## 2012-12-22 NOTE — ED Provider Notes (Signed)
History    This chart was scribed for non-physician practitioner Fayrene Helper PA-C working with Toy Baker, MD by Smitty Pluck, ED scribe. This patient was seen in room WTR7/WTR7 and the patient's care was started at 6:37 PM.   CSN: [id number]  Arrival date & time 12/22/12  1825      No chief complaint on file.    The history is provided by the patient and medical records. No language interpreter was used.   HPI Comments: Sheri Park is a 47 y.o. female who presents to the Emergency Department complaining of constant, throbbing, severe right dental pain onset today after having 4 teeth pulled today. She states she has taken tylenol 3 without relief. She states she had 2 teeth pulled 1 week ago without complication or similar pain.  Pt denies fever, chills, excessive bleeding, pain with cold air, nausea, vomiting, diarrhea, weakness, cough, SOB and any other pain.   Dentist is Dr. Mickeal Needy  Past Medical History  Diagnosis Date  . Asthma   . Hypertension     Past Surgical History  Procedure Laterality Date  . Tubal ligation    . Knee surgery      No family history on file.  History  Substance Use Topics  . Smoking status: Never Smoker   . Smokeless tobacco: Not on file  . Alcohol Use: No    OB History   Grav Para Term Preterm Abortions TAB SAB Ect Mult Living                  Review of Systems  Constitutional: Negative for fever and chills.  HENT: Positive for dental problem.   Respiratory: Negative for shortness of breath.   Gastrointestinal: Negative for nausea and vomiting.  Neurological: Negative for weakness.    Allergies  Codeine and Ketorolac tromethamine  Home Medications   Current Outpatient Rx  Name  Route  Sig  Dispense  Refill  . albuterol (PROVENTIL HFA;VENTOLIN HFA) 108 (90 BASE) MCG/ACT inhaler   Inhalation   Inhale 2 puffs into the lungs every 6 (six) hours as needed. For shortness of breath.         .  ciprofloxacin-dexamethasone (CIPRODEX) otic suspension   Left Ear   Place 4 drops into the left ear 2 (two) times daily.   7.5 mL   0   . ibuprofen (ADVIL,MOTRIN) 200 MG tablet   Oral   Take 600 mg by mouth every 8 (eight) hours as needed. For pain.         . verapamil (VERELAN PM) 240 MG 24 hr capsule   Oral   Take 1 capsule (240 mg total) by mouth at bedtime.   30 capsule   0     BP 164/92  Pulse 84  Temp(Src) 99.6 F (37.6 C) (Oral)  Resp 17  SpO2 96%  Physical Exam  Nursing note and vitals reviewed. Constitutional: She is oriented to person, place, and time. She appears well-developed and well-nourished. No distress.  HENT:  Head: Normocephalic and atraumatic.  Second molar, 1st and 2nd premolar and right upper canine removed Surrounding gum is inflamed and erythematous consistent with dental surgery   Eyes: EOM are normal.  Neck: Neck supple. No tracheal deviation present.  Cardiovascular: Normal rate.   Pulmonary/Chest: Effort normal. No respiratory distress.  Musculoskeletal: Normal range of motion.  Neurological: She is alert and oriented to person, place, and time.  Skin: Skin is warm and dry.  Psychiatric: She has  a normal mood and affect. Her behavior is normal.    ED Course  Procedures (including critical care time) DIAGNOSTIC STUDIES: Oxygen Saturation is 96% on room air, adequate by my interpretation.    COORDINATION OF CARE: 6:41 PM Discussed ED treatment with pt and pt agrees pain medication and follow up with dentist for further evaluation.  7:09 PM Pt report feeling better after receiving pain meds.  Pt will f/u with dentist tomorrow for further care.    Medications  HYDROmorphone (DILAUDID) injection 1 mg (not administered)        Labs Reviewed - No data to display No results found.   1. Loss of teeth due to extraction, unspecified edentulism   2. Pain, dental       MDM  BP 164/92  Pulse 84  Temp(Src) 99.6 F (37.6 C)  (Oral)  Resp 17  SpO2 96%     I personally performed the services described in this documentation, which was scribed in my presence. The recorded information has been reviewed and is accurate.     Fayrene Helper, PA-C 12/22/12 1909

## 2012-12-23 NOTE — ED Provider Notes (Signed)
Medical screening examination/treatment/procedure(s) were performed by non-physician practitioner and as supervising physician I was immediately available for consultation/collaboration.  Toy Baker, MD 12/23/12 1310

## 2014-01-28 ENCOUNTER — Emergency Department (HOSPITAL_COMMUNITY): Payer: Medicaid Other

## 2014-01-28 ENCOUNTER — Encounter (HOSPITAL_COMMUNITY): Payer: Self-pay | Admitting: Emergency Medicine

## 2014-01-28 ENCOUNTER — Emergency Department (HOSPITAL_COMMUNITY)
Admission: EM | Admit: 2014-01-28 | Discharge: 2014-01-29 | Disposition: A | Discharge status: Home / Self Care | Payer: Medicaid Other | Attending: Emergency Medicine | Admitting: Emergency Medicine

## 2014-01-28 DIAGNOSIS — I10 Essential (primary) hypertension: Secondary | ICD-10-CM | POA: Insufficient documentation

## 2014-01-28 DIAGNOSIS — Z3202 Encounter for pregnancy test, result negative: Secondary | ICD-10-CM | POA: Diagnosis not present

## 2014-01-28 DIAGNOSIS — R1110 Vomiting, unspecified: Secondary | ICD-10-CM | POA: Insufficient documentation

## 2014-01-28 DIAGNOSIS — R1011 Right upper quadrant pain: Secondary | ICD-10-CM | POA: Diagnosis present

## 2014-01-28 DIAGNOSIS — J45909 Unspecified asthma, uncomplicated: Secondary | ICD-10-CM | POA: Insufficient documentation

## 2014-01-28 LAB — COMPREHENSIVE METABOLIC PANEL
ALBUMIN: 3.8 g/dL (ref 3.5–5.2)
ALT: 19 U/L (ref 0–35)
AST: 21 U/L (ref 0–37)
Alkaline Phosphatase: 60 U/L (ref 39–117)
Anion gap: 13 (ref 5–15)
BUN: 8 mg/dL (ref 6–23)
CALCIUM: 9.3 mg/dL (ref 8.4–10.5)
CO2: 25 mEq/L (ref 19–32)
Chloride: 102 mEq/L (ref 96–112)
Creatinine, Ser: 0.9 mg/dL (ref 0.50–1.10)
GFR calc Af Amer: 88 mL/min — ABNORMAL LOW (ref >90)
GFR calc non Af Amer: 76 mL/min — ABNORMAL LOW (ref >90)
Glucose, Bld: 95 mg/dL (ref 70–99)
Potassium: 4.1 mEq/L (ref 3.7–5.3)
SODIUM: 140 meq/L (ref 137–147)
TOTAL PROTEIN: 8.2 g/dL (ref 6.0–8.3)
Total Bilirubin: 0.3 mg/dL (ref 0.3–1.2)

## 2014-01-28 LAB — URINALYSIS, ROUTINE W REFLEX MICROSCOPIC
Bilirubin Urine: NEGATIVE
GLUCOSE, UA: NEGATIVE mg/dL
Hgb urine dipstick: NEGATIVE
Ketones, ur: NEGATIVE mg/dL
LEUKOCYTES UA: NEGATIVE
Nitrite: NEGATIVE
PH: 5.5 (ref 5.0–8.0)
PROTEIN: NEGATIVE mg/dL
Specific Gravity, Urine: 1.016 (ref 1.005–1.030)
Urobilinogen, UA: 0.2 mg/dL (ref 0.0–1.0)

## 2014-01-28 LAB — LIPASE, BLOOD: Lipase: 34 U/L (ref 11–59)

## 2014-01-28 LAB — CBC WITH DIFFERENTIAL/PLATELET
BASOS ABS: 0 10*3/uL (ref 0.0–0.1)
Basophils Relative: 0 % (ref 0–1)
EOS ABS: 0.2 10*3/uL (ref 0.0–0.7)
EOS PCT: 3 % (ref 0–5)
HCT: 31.2 % — ABNORMAL LOW (ref 36.0–46.0)
Hemoglobin: 9.7 g/dL — ABNORMAL LOW (ref 12.0–15.0)
LYMPHS PCT: 34 % (ref 12–46)
Lymphs Abs: 2.3 10*3/uL (ref 0.7–4.0)
MCH: 22 pg — AB (ref 26.0–34.0)
MCHC: 31.1 g/dL (ref 30.0–36.0)
MCV: 70.9 fL — AB (ref 78.0–100.0)
Monocytes Absolute: 0.6 10*3/uL (ref 0.1–1.0)
Monocytes Relative: 8 % (ref 3–12)
Neutro Abs: 3.7 10*3/uL (ref 1.7–7.7)
Neutrophils Relative %: 55 % (ref 43–77)
PLATELETS: 290 10*3/uL (ref 150–400)
RBC: 4.4 MIL/uL (ref 3.87–5.11)
RDW: 16.1 % — AB (ref 11.5–15.5)
WBC: 6.7 10*3/uL (ref 4.0–10.5)

## 2014-01-28 LAB — PREGNANCY, URINE: PREG TEST UR: NEGATIVE

## 2014-01-28 MED ORDER — HYDROMORPHONE HCL PF 1 MG/ML IJ SOLN
1.0000 mg | Freq: Once | INTRAMUSCULAR | Status: AC
  Administered 2014-01-28: 1 mg via INTRAVENOUS
  Filled 2014-01-28: qty 1

## 2014-01-28 MED ORDER — IOHEXOL 300 MG/ML  SOLN
100.0000 mL | Freq: Once | INTRAMUSCULAR | Status: AC | PRN
  Administered 2014-01-28: 100 mL via INTRAVENOUS

## 2014-01-28 MED ORDER — LORAZEPAM 2 MG/ML IJ SOLN
1.0000 mg | Freq: Once | INTRAMUSCULAR | Status: AC
  Administered 2014-01-28: 1 mg via INTRAVENOUS
  Filled 2014-01-28: qty 1

## 2014-01-28 MED ORDER — ONDANSETRON HCL 4 MG/2ML IJ SOLN
4.0000 mg | Freq: Once | INTRAMUSCULAR | Status: AC
  Administered 2014-01-28: 4 mg via INTRAVENOUS
  Filled 2014-01-28: qty 2

## 2014-01-28 MED ORDER — ONDANSETRON 8 MG PO TBDP: 8.0000 mg | ORAL_TABLET | Freq: Three times a day (TID) | ORAL | Status: DC | PRN

## 2014-01-28 MED ORDER — IOHEXOL 300 MG/ML  SOLN
50.0000 mL | Freq: Once | INTRAMUSCULAR | Status: AC | PRN
  Administered 2014-01-28: 50 mL via ORAL

## 2014-01-28 MED ORDER — OXYCODONE-ACETAMINOPHEN 5-325 MG PO TABS
1.0000 | ORAL_TABLET | ORAL | Status: DC | PRN
Start: 2014-01-28 — End: 2015-07-01

## 2014-01-28 MED ORDER — SODIUM CHLORIDE 0.9 % IV SOLN
1000.0000 mL | Freq: Once | INTRAVENOUS | Status: AC
  Administered 2014-01-28: 1000 mL via INTRAVENOUS

## 2014-01-28 MED ORDER — SODIUM CHLORIDE 0.9 % IV SOLN
1000.0000 mL | INTRAVENOUS | Status: DC
Start: 2014-01-28 — End: 2014-01-29
  Administered 2014-01-28: 1000 mL via INTRAVENOUS

## 2014-01-28 MED ORDER — HYDROMORPHONE HCL PF 1 MG/ML IJ SOLN
1.0000 mg | INTRAMUSCULAR | Status: DC | PRN
  Administered 2014-01-28 (×2): 1 mg via INTRAVENOUS
  Filled 2014-01-28 (×2): qty 1

## 2014-01-28 NOTE — ED Notes (Signed)
Patient transported to Ultrasound 

## 2014-01-28 NOTE — ED Notes (Signed)
Patient was taken to the bathroom to urinate. After urination bladder scanner shows 13ml left in the bladder

## 2014-01-28 NOTE — ED Provider Notes (Signed)
CSN: [id number]     Arrival date & time 01/28/14  1832 History   First MD Initiated Contact with Patient 01/28/14 1838     Chief Complaint  Patient presents with  . Abdominal Pain  . Emesis      HPI Patient reports 3 days of intermittent right upper quadrant abdominal pain and now seems to be constant and is worsening.  No prior history of gallstones.  She's never had pain like this before.  She denies chest pain shortness of breath.  No productive cough.  Denies fevers and chills.  Denies hematemesis.  No diarrhea.  Pain is severe in severity at this time.  Denies urinary complaints.   Past Medical History  Diagnosis Date  . Asthma   . Hypertension    Past Surgical History  Procedure Laterality Date  . Tubal ligation    . Knee surgery     History reviewed. No pertinent family history. History  Substance Use Topics  . Smoking status: Never Smoker   . Smokeless tobacco: Not on file  . Alcohol Use: No   OB History   Grav Para Term Preterm Abortions TAB SAB Ect Mult Living                 Review of Systems  All other systems reviewed and are negative.     Allergies  Codeine and Ketorolac tromethamine  Home Medications   Prior to Admission medications   Medication Sig Start Date End Date Taking? Authorizing Provider  Pseudoeph-Doxylamine-DM-APAP 60-7.12-10-998 MG/30ML LIQD Take 30 mLs by mouth at bedtime as needed (pain/flu-like symptoms).   Yes Historical Provider, MD  ondansetron (ZOFRAN ODT) 8 MG disintegrating tablet Take 1 tablet (8 mg total) by mouth every 8 (eight) hours as needed for nausea or vomiting. 01/28/14   Lyanne Co, MD  oxyCODONE-acetaminophen (PERCOCET/ROXICET) 5-325 MG per tablet Take 1 tablet by mouth every 4 (four) hours as needed for severe pain. 01/28/14   Lyanne Co, MD   BP 159/92  Pulse 68  Temp(Src) 98 F (36.7 C) (Oral)  Resp 20  Ht 5\' 4"  (1.626 m)  Wt 225 lb (102.059 kg)  BMI 38.60 kg/m2  SpO2 96%  LMP 10/14/2013 Physical  Exam  Nursing note and vitals reviewed. Constitutional: She is oriented to person, place, and time. She appears well-developed and well-nourished. No distress.  Uncomfortable appearing  HENT:  Head: Normocephalic and atraumatic.  Eyes: EOM are normal.  Neck: Normal range of motion.  Cardiovascular: Normal rate, regular rhythm and normal heart sounds.   Pulmonary/Chest: Effort normal and breath sounds normal.  Abdominal: Soft. She exhibits no distension.  Right upper quadrant tenderness without guarding or rebound  Musculoskeletal: Normal range of motion.  Neurological: She is alert and oriented to person, place, and time.  Skin: Skin is warm and dry.  Psychiatric: She has a normal mood and affect. Judgment normal.    ED Course  Procedures (including critical care time) Labs Review Labs Reviewed  CBC WITH DIFFERENTIAL - Abnormal; Notable for the following:    Hemoglobin 9.7 (*)    HCT 31.2 (*)    MCV 70.9 (*)    MCH 22.0 (*)    RDW 16.1 (*)    All other components within normal limits  COMPREHENSIVE METABOLIC PANEL - Abnormal; Notable for the following:    GFR calc non Af Amer 76 (*)    GFR calc Af Amer 88 (*)    All other components within normal limits  URINALYSIS, ROUTINE W REFLEX MICROSCOPIC - Abnormal; Notable for the following:    APPearance CLOUDY (*)    All other components within normal limits  LIPASE, BLOOD  PREGNANCY, URINE    Imaging Review Ct Abdomen Pelvis W Contrast  01/28/2014   CLINICAL DATA:  Right abdominal pain  EXAM: CT ABDOMEN AND PELVIS WITH CONTRAST  TECHNIQUE: Multidetector CT imaging of the abdomen and pelvis was performed using the standard protocol following bolus administration of intravenous contrast.  CONTRAST:  50mL OMNIPAQUE IOHEXOL 300 MG/ML SOLN, 100mL OMNIPAQUE IOHEXOL 300 MG/ML SOLN  COMPARISON:  None.  FINDINGS: The lung bases are clear.  The liver demonstrates no focal abnormality. There is no intrahepatic or extrahepatic biliary ductal  dilatation. The gallbladder is normal. The spleen demonstrates no focal abnormality.There is mild bilateral hydronephrosis. The adrenal glands and pancreas are normal. The bladder is unremarkable.  The stomach, duodenum, small intestine, and large intestine demonstrate no contrast extravasation or dilatation. There is a normal caliber appendix in the right lower quadrant without periappendiceal inflammatory changes.Small fat containing umbilical hernia. There is no pneumoperitoneum, pneumatosis, or portal venous gas. There is no abdominal or pelvic free fluid. There is no lymphadenopathy. There is high-density material within the endometrial likely representing blood products versus thickened endometrium.  The abdominal aorta is normal in caliber .  There are no lytic or sclerotic osseous lesions. Bilateral facet arthropathy at L4-5 and L5-S1.  IMPRESSION: 1. Mild bilateral hydronephrosis. 2. There is high-density material within the endometrial likely representing blood products versus thickened endometrium.   Electronically Signed   By: Elige KoHetal  Patel   On: 01/28/2014 23:12   [place] Abdomen Limited  01/28/2014   CLINICAL DATA:  Emesis  EXAM: US ABDOMEN LIMITED - RIGHT UPPER QUADRANT  COMPARISON:  None.  FINDINGS: Gallbladder:  No gallstones or wall thickening visualized. No sonographic Murphy sign noted.  Common bile duct:  Diameter: 3.6 mm  Liver:  No focal lesion identified. Within normal limits in parenchymal echogenicity.  IMPRESSION: Normal right upper quadrant ultrasound.   Electronically Signed   By: Elige KoHetal  Patel   On: 01/28/2014 20:32  I personally reviewed the imaging tests through PACS system I reviewed available ER/hospitalization records through the EMR    EKG Interpretation None      MDM   Final diagnoses:  Right upper quadrant pain    12:15 AM Patient is feeling better at this time.  CT scan without significant findings here mild bilateral hydronephrosis likely the patient just needs to  urinate.  She will urinate at this time and we will repeat her bladder scan.  No urinary complaints.  Urine and urine pregnancy are normal.  Labs are without significant abnormality.  Patient will followup with her gynecologist regarding blood products versus thickened endometrium noted in uterus.  Patient has been told to followup with gastroenterology.  She may benefit from a HIDA scan to further evaluate for possible gallbladder dysfunction.  Patient understands to return to the ER for new or worsening symptoms    Lyanne CoKevin M Toria Monte, MD 01/30/14 [phone]

## 2014-01-28 NOTE — ED Notes (Signed)
Bladder scanner done before patient went to bathroom and it was greater than 407 in her bladder.

## 2014-01-28 NOTE — ED Notes (Addendum)
A pregnancy urine add on was done at 19:30. Called down to lab. Lab is in the process of running it.

## 2014-01-28 NOTE — Discharge Instructions (Signed)

## 2014-01-28 NOTE — ED Notes (Signed)
Pt presents with RUQ abdominal pain 7/10, onset 3 days ago, got worse today, reports 1 episode of emesis, endorses mild dizziness and chills, denies diarrhea.

## 2014-12-06 ENCOUNTER — Emergency Department (HOSPITAL_COMMUNITY)
Admission: EM | Admit: 2014-12-06 | Discharge: 2014-12-06 | Disposition: A | Discharge status: Home / Self Care | Payer: Medicaid Other | Attending: Emergency Medicine | Admitting: Emergency Medicine

## 2014-12-06 ENCOUNTER — Encounter (HOSPITAL_COMMUNITY): Payer: Self-pay

## 2014-12-06 ENCOUNTER — Emergency Department (HOSPITAL_COMMUNITY): Payer: Medicaid Other

## 2014-12-06 DIAGNOSIS — J45901 Unspecified asthma with (acute) exacerbation: Secondary | ICD-10-CM

## 2014-12-06 DIAGNOSIS — Z3202 Encounter for pregnancy test, result negative: Secondary | ICD-10-CM | POA: Insufficient documentation

## 2014-12-06 DIAGNOSIS — Z7982 Long term (current) use of aspirin: Secondary | ICD-10-CM | POA: Diagnosis not present

## 2014-12-06 DIAGNOSIS — Z7952 Long term (current) use of systemic steroids: Secondary | ICD-10-CM | POA: Insufficient documentation

## 2014-12-06 DIAGNOSIS — I10 Essential (primary) hypertension: Secondary | ICD-10-CM | POA: Diagnosis not present

## 2014-12-06 DIAGNOSIS — R0602 Shortness of breath: Secondary | ICD-10-CM | POA: Diagnosis present

## 2014-12-06 LAB — CBC
HCT: 29.8 % — ABNORMAL LOW (ref 36.0–46.0)
Hemoglobin: 9.1 g/dL — ABNORMAL LOW (ref 12.0–15.0)
MCH: 21.7 pg — AB (ref 26.0–34.0)
MCHC: 30.5 g/dL (ref 30.0–36.0)
MCV: 71.1 fL — ABNORMAL LOW (ref 78.0–100.0)
PLATELETS: 259 10*3/uL (ref 150–400)
RBC: 4.19 MIL/uL (ref 3.87–5.11)
RDW: 17.1 % — ABNORMAL HIGH (ref 11.5–15.5)
WBC: 5.8 10*3/uL (ref 4.0–10.5)

## 2014-12-06 LAB — BASIC METABOLIC PANEL
Anion gap: 8 (ref 5–15)
BUN: 7 mg/dL (ref 6–20)
CO2: 27 mmol/L (ref 22–32)
Calcium: 8.8 mg/dL — ABNORMAL LOW (ref 8.9–10.3)
Chloride: 104 mmol/L (ref 101–111)
Creatinine, Ser: 0.79 mg/dL (ref 0.44–1.00)
GFR calc Af Amer: >60 mL/min (ref >60)
GFR calc non Af Amer: >60 mL/min (ref >60)
Glucose, Bld: 101 mg/dL — ABNORMAL HIGH (ref 65–99)
Potassium: 3.7 mmol/L (ref 3.5–5.1)
Sodium: 139 mmol/L (ref 135–145)

## 2014-12-06 LAB — POC URINE PREG, ED: Preg Test, Ur: NEGATIVE

## 2014-12-06 LAB — I-STAT TROPONIN, ED: Troponin i, poc: 0 ng/mL (ref 0.00–0.08)

## 2014-12-06 LAB — BRAIN NATRIURETIC PEPTIDE: B Natriuretic Peptide: 45.2 pg/mL (ref 0.0–100.0)

## 2014-12-06 MED ORDER — IPRATROPIUM-ALBUTEROL 0.5-2.5 (3) MG/3ML IN SOLN
3.0000 mL | Freq: Once | RESPIRATORY_TRACT | Status: AC
  Administered 2014-12-06: 3 mL via RESPIRATORY_TRACT
  Filled 2014-12-06: qty 3

## 2014-12-06 MED ORDER — ALBUTEROL SULFATE HFA 108 (90 BASE) MCG/ACT IN AERS
1.0000 | INHALATION_SPRAY | Freq: Once | RESPIRATORY_TRACT | Status: AC
  Administered 2014-12-06: 2 via RESPIRATORY_TRACT
  Filled 2014-12-06: qty 6.7

## 2014-12-06 MED ORDER — FLUTICASONE PROPIONATE HFA 110 MCG/ACT IN AERO: 1.0000 | INHALATION_SPRAY | Freq: Two times a day (BID) | RESPIRATORY_TRACT | Status: DC

## 2014-12-06 NOTE — ED Provider Notes (Signed)
CSN: [id number]     Arrival date & time 12/06/14  1106 History   First MD Initiated Contact with Patient 12/06/14 1132     Chief Complaint  Patient presents with  . Chest Pain  . Shortness of Breath  . Cough    HPI Started a few days ago after unpacking dusty boxes in the attic.  She started having chest tightness and wheezing.  She has been coughing a lot.  Her chest hurts, like a pinching.  She feels like she has something like mucus in her chest.  Whenever she is around any strong odors now she starts coughing.  No fever.  No leg swelling.  No PE or cardiac history.  She does have asthma but has not had to take any medications for years.  She does have HTN but has not been on any meds for a while. Past Medical History  Diagnosis Date  . Asthma   . Hypertension    Past Surgical History  Procedure Laterality Date  . Tubal ligation    . Knee surgery     No family history on file. History  Substance Use Topics  . Smoking status: Never Smoker   . Smokeless tobacco: Not on file  . Alcohol Use: No   OB History    No data available     Review of Systems  All other systems reviewed and are negative.     Allergies  Codeine and Ketorolac tromethamine  Home Medications   Prior to Admission medications   Medication Sig Start Date End Date Taking? Authorizing Provider  aspirin 325 MG tablet Take 650 mg by mouth every 4 (four) hours as needed for headache.   Yes Historical Provider, MD  fluticasone (FLOVENT HFA) 110 MCG/ACT inhaler Inhale 1 puff into the lungs 2 (two) times daily. 12/06/14   Linwood DibblesJon Madisyn Mawhinney, MD  ondansetron (ZOFRAN ODT) 8 MG disintegrating tablet Take 1 tablet (8 mg total) by mouth every 8 (eight) hours as needed for nausea or vomiting. Patient not taking: Reported on 12/06/2014 01/28/14   Azalia BilisKevin Campos, MD  oxyCODONE-acetaminophen (PERCOCET/ROXICET) 5-325 MG per tablet Take 1 tablet by mouth every 4 (four) hours as needed for severe pain. Patient not taking: Reported on  12/06/2014 01/28/14   Azalia BilisKevin Campos, MD   BP 161/99 mmHg  Pulse 73  Temp(Src) 98.7 F (37.1 C) (Oral)  Resp 30  Ht 5\' 4"  (1.626 m)  Wt 225 lb (102.059 kg)  BMI 38.60 kg/m2  SpO2 100%  LMP 10/16/2014 (Within Months) Physical Exam  Constitutional: She appears well-developed and well-nourished. No distress.  HENT:  Head: Normocephalic and atraumatic.  Right Ear: External ear normal.  Left Ear: External ear normal.  Eyes: Conjunctivae are normal. Right eye exhibits no discharge. Left eye exhibits no discharge. No scleral icterus.  Neck: Neck supple. No tracheal deviation present.  Cardiovascular: Normal rate, regular rhythm and intact distal pulses.   Pulmonary/Chest: Effort normal and breath sounds normal. No stridor. No respiratory distress. She has no wheezes. She has no rales.  Faint wheeze on end expiration  Abdominal: Soft. Bowel sounds are normal. She exhibits no distension. There is no tenderness. There is no rebound and no guarding.  Musculoskeletal: She exhibits no edema or tenderness.  Neurological: She is alert. She has normal strength. No cranial nerve deficit (no facial droop, extraocular movements intact, no slurred speech) or sensory deficit. She exhibits normal muscle tone. She displays no seizure activity. Coordination normal.  Skin: Skin is warm and  dry. No rash noted.  Psychiatric: She has a normal mood and affect.  Nursing note and vitals reviewed.   ED Course  Procedures (including critical care time) Labs Review Labs Reviewed  BASIC METABOLIC PANEL - Abnormal; Notable for the following:    Glucose, Bld 101 (*)    Calcium 8.8 (*)    All other components within normal limits  CBC - Abnormal; Notable for the following:    Hemoglobin 9.1 (*)    HCT 29.8 (*)    MCV 71.1 (*)    MCH 21.7 (*)    RDW 17.1 (*)    All other components within normal limits  BRAIN NATRIURETIC PEPTIDE  I-STAT TROPOININ, ED  POC URINE PREG, ED    Imaging Review Dg Chest 2  View  12/06/2014   CLINICAL DATA:  Dry cough and chest pain for 3 days.  EXAM: CHEST  2 VIEW  COMPARISON:  02/11/2012.  FINDINGS: Trachea is midline. Heart size normal. Lungs are clear. No pleural fluid.  IMPRESSION: No acute findings.   Electronically Signed   By: Leanna Battles M.D.   On: 12/06/2014 12:38     EKG Interpretation   Date/Time:  Wednesday Dec 06 2014 11:17:44 EDT Ventricular Rate:  73 PR Interval:  150 QRS Duration: 84 QT Interval:  418 QTC Calculation: 461 R Axis:   34 Text Interpretation:  Sinus rhythm No significant change since last  tracing Confirmed by Autie Vasudevan  MD-J, Shequita Peplinski ([id number]) on 12/06/2014 11:28:20 AM      MDM   Final diagnoses:  Asthma, unspecified asthma severity, with acute exacerbation    Pt feels much better after one nebulizer treatment.  Pt used to take albuterol inhalers including albuterol and flovent.  Do not think she requires oral steroids.  Pt has plans to see a PCP once her insurance kicks in in 30 days.  Stable for discharge.    Linwood Dibbles, MD 12/06/14 [phone]

## 2014-12-06 NOTE — Discharge Instructions (Signed)

## 2014-12-06 NOTE — ED Notes (Signed)
Husband died in April from Cancer. She has two small children

## 2014-12-06 NOTE — ED Notes (Signed)
MD at bedside. EDP JON KNAPP

## 2014-12-06 NOTE — ED Notes (Signed)
Pt presents with NAD. C/o of generalized CP with increased SOB. CP greater with coughing. Non radiating. Non productive cough x 3 days. Denies fever. Pt is asthmatic has not has inhaler. Describes CP "chest tightness" "when I cough something is just stuck in my upper chest".

## 2014-12-10 ENCOUNTER — Emergency Department (HOSPITAL_COMMUNITY)
Admission: EM | Admit: 2014-12-10 | Discharge: 2014-12-10 | Disposition: A | Discharge status: Home / Self Care | Payer: Medicaid Other | Attending: Emergency Medicine | Admitting: Emergency Medicine

## 2014-12-10 ENCOUNTER — Encounter (HOSPITAL_COMMUNITY): Payer: Self-pay | Admitting: Emergency Medicine

## 2014-12-10 ENCOUNTER — Emergency Department (HOSPITAL_COMMUNITY): Payer: Medicaid Other

## 2014-12-10 DIAGNOSIS — R079 Chest pain, unspecified: Secondary | ICD-10-CM | POA: Diagnosis not present

## 2014-12-10 DIAGNOSIS — Z79899 Other long term (current) drug therapy: Secondary | ICD-10-CM | POA: Diagnosis not present

## 2014-12-10 DIAGNOSIS — Z7951 Long term (current) use of inhaled steroids: Secondary | ICD-10-CM | POA: Insufficient documentation

## 2014-12-10 DIAGNOSIS — J45901 Unspecified asthma with (acute) exacerbation: Secondary | ICD-10-CM | POA: Diagnosis not present

## 2014-12-10 DIAGNOSIS — J45909 Unspecified asthma, uncomplicated: Secondary | ICD-10-CM | POA: Diagnosis present

## 2014-12-10 DIAGNOSIS — Z7982 Long term (current) use of aspirin: Secondary | ICD-10-CM | POA: Insufficient documentation

## 2014-12-10 DIAGNOSIS — I10 Essential (primary) hypertension: Secondary | ICD-10-CM | POA: Insufficient documentation

## 2014-12-10 LAB — COMPREHENSIVE METABOLIC PANEL
ALBUMIN: 3.7 g/dL (ref 3.5–5.0)
ALT: 15 U/L (ref 14–54)
ANION GAP: 9 (ref 5–15)
AST: 25 U/L (ref 15–41)
Alkaline Phosphatase: 53 U/L (ref 38–126)
BUN: 11 mg/dL (ref 6–20)
CO2: 25 mmol/L (ref 22–32)
CREATININE: 1.19 mg/dL — AB (ref 0.44–1.00)
Calcium: 8.8 mg/dL — ABNORMAL LOW (ref 8.9–10.3)
Chloride: 104 mmol/L (ref 101–111)
GFR calc Af Amer: >60 mL/min (ref >60)
GFR, EST NON AFRICAN AMERICAN: 53 mL/min — AB (ref >60)
Glucose, Bld: 117 mg/dL — ABNORMAL HIGH (ref 65–99)
Potassium: 3.8 mmol/L (ref 3.5–5.1)
Sodium: 138 mmol/L (ref 135–145)
Total Bilirubin: 0.4 mg/dL (ref 0.3–1.2)
Total Protein: 7.7 g/dL (ref 6.5–8.1)

## 2014-12-10 LAB — I-STAT CHEM 8, ED
BUN: 11 mg/dL (ref 6–20)
Calcium, Ion: 1.09 mmol/L — ABNORMAL LOW (ref 1.12–1.23)
Chloride: 103 mmol/L (ref 101–111)
Creatinine, Ser: 1.2 mg/dL — ABNORMAL HIGH (ref 0.44–1.00)
Glucose, Bld: 115 mg/dL — ABNORMAL HIGH (ref 65–99)
HCT: 31 % — ABNORMAL LOW (ref 36.0–46.0)
Hemoglobin: 10.5 g/dL — ABNORMAL LOW (ref 12.0–15.0)
Potassium: 3.8 mmol/L (ref 3.5–5.1)
Sodium: 139 mmol/L (ref 135–145)
TCO2: 22 mmol/L (ref 0–100)

## 2014-12-10 LAB — CBC
HEMATOCRIT: 29.6 % — AB (ref 36.0–46.0)
HEMOGLOBIN: 9.1 g/dL — AB (ref 12.0–15.0)
MCH: 21.9 pg — ABNORMAL LOW (ref 26.0–34.0)
MCHC: 30.7 g/dL (ref 30.0–36.0)
MCV: 71.3 fL — ABNORMAL LOW (ref 78.0–100.0)
Platelets: 258 10*3/uL (ref 150–400)
RBC: 4.15 MIL/uL (ref 3.87–5.11)
RDW: 17.1 % — ABNORMAL HIGH (ref 11.5–15.5)
WBC: 7.2 10*3/uL (ref 4.0–10.5)

## 2014-12-10 LAB — I-STAT TROPONIN, ED: Troponin i, poc: 0 ng/mL (ref 0.00–0.08)

## 2014-12-10 MED ORDER — METHYLPREDNISOLONE SODIUM SUCC 125 MG IJ SOLR
125.0000 mg | Freq: Once | INTRAMUSCULAR | Status: AC
  Administered 2014-12-10: 125 mg via INTRAVENOUS
  Filled 2014-12-10: qty 2

## 2014-12-10 MED ORDER — SODIUM CHLORIDE 0.9 % IV BOLUS (SEPSIS)
500.0000 mL | Freq: Once | INTRAVENOUS | Status: AC
  Administered 2014-12-10: 500 mL via INTRAVENOUS

## 2014-12-10 MED ORDER — IPRATROPIUM-ALBUTEROL 0.5-2.5 (3) MG/3ML IN SOLN
3.0000 mL | Freq: Once | RESPIRATORY_TRACT | Status: AC
  Administered 2014-12-10: 3 mL via RESPIRATORY_TRACT
  Filled 2014-12-10: qty 3

## 2014-12-10 MED ORDER — ONDANSETRON HCL 4 MG/2ML IJ SOLN
4.0000 mg | Freq: Once | INTRAMUSCULAR | Status: AC
  Administered 2014-12-10: 4 mg via INTRAVENOUS
  Filled 2014-12-10: qty 2

## 2014-12-10 MED ORDER — PREDNISONE 10 MG PO TABS: 20.0000 mg | ORAL_TABLET | Freq: Every day | ORAL | Status: DC

## 2014-12-10 MED ORDER — ASPIRIN 81 MG PO CHEW
324.0000 mg | CHEWABLE_TABLET | Freq: Once | ORAL | Status: AC
  Administered 2014-12-10: 324 mg via ORAL
  Filled 2014-12-10: qty 4

## 2014-12-10 MED ORDER — MORPHINE SULFATE 4 MG/ML IJ SOLN
4.0000 mg | Freq: Once | INTRAMUSCULAR | Status: AC
  Administered 2014-12-10: 4 mg via INTRAVENOUS
  Filled 2014-12-10: qty 1

## 2014-12-10 NOTE — ED Notes (Signed)
Pt. Came in to ED with compliant of SOB , asthma and chest pain at 6/10. Pt. stated that she woke up this evening at 10pm due to SOB and chest pain , gave herself breathing treatment prior to coming to ED without relief. Pt. Said she had asthma whole day today , got worsen this evening.

## 2014-12-10 NOTE — ED Provider Notes (Signed)
CSN: [id number]     Arrival date & time 12/10/14  0014 History   First MD Initiated Contact with Patient 12/10/14 0047     Chief Complaint  Patient presents with  . Chest Pain  . Asthma     (Consider location/radiation/quality/duration/timing/severity/associated sxs/prior Treatment) HPI Comments: Some morbidly obese 49 year old female with a history of asthma who has not been on medication for years was seen May 25 for asthma shortness of breath and chest pain she was treated in the emergency department and discharge him presents tonight with return of her symptoms worsening shortness of breath and cough not relieved by albuterol inhaler that she's been using all day she says her chest feels tight stabbing or pinching. She also reports thick mucus that seems to be sitting in her chest she wishes she could "cough it up" She also has a history of hypertension that is untreated for several years lack of medical insurance.  Patient is a 49 y.o. female presenting with chest pain and asthma. The history is provided by the patient.  Chest Pain Pain location:  Substernal area Pain quality: stabbing   Pain radiates to:  Does not radiate Pain radiates to the back: no   Pain severity:  Mild Onset quality:  Gradual Timing:  Constant Progression:  Unchanged Chronicity:  Recurrent Context: breathing   Relieved by:  Nothing Worsened by:  Coughing Ineffective treatments:  None tried Associated symptoms: cough and shortness of breath   Associated symptoms: no fever, no nausea, no palpitations and not vomiting   Asthma Associated symptoms include chest pain and coughing. Pertinent negatives include no congestion, fever, nausea or vomiting.    Past Medical History  Diagnosis Date  . Asthma   . Hypertension    Past Surgical History  Procedure Laterality Date  . Tubal ligation    . Knee surgery     History reviewed. No pertinent family history. History  Substance Use Topics  . Smoking  status: Never Smoker   . Smokeless tobacco: Not on file  . Alcohol Use: No   OB History    No data available     Review of Systems  Constitutional: Negative for fever.  HENT: Negative for congestion and rhinorrhea.   Respiratory: Positive for cough, shortness of breath and wheezing.   Cardiovascular: Positive for chest pain. Negative for palpitations and leg swelling.  Gastrointestinal: Negative for nausea and vomiting.  All other systems reviewed and are negative.     Allergies  Codeine and Ketorolac tromethamine  Home Medications   Prior to Admission medications   Medication Sig Start Date End Date Taking? Authorizing Provider  albuterol (PROVENTIL HFA;VENTOLIN HFA) 108 (90 BASE) MCG/ACT inhaler Inhale 1-2 puffs into the lungs every 6 (six) hours as needed for wheezing or shortness of breath.   Yes Historical Provider, MD  aspirin 325 MG tablet Take 650 mg by mouth every 4 (four) hours as needed for headache.   Yes Historical Provider, MD  fluticasone (FLOVENT HFA) 110 MCG/ACT inhaler Inhale 1 puff into the lungs 2 (two) times daily. 12/06/14  Yes Linwood Dibbles, MD  ondansetron (ZOFRAN ODT) 8 MG disintegrating tablet Take 1 tablet (8 mg total) by mouth every 8 (eight) hours as needed for nausea or vomiting. Patient not taking: Reported on 12/06/2014 01/28/14   Azalia Bilis, MD  oxyCODONE-acetaminophen (PERCOCET/ROXICET) 5-325 MG per tablet Take 1 tablet by mouth every 4 (four) hours as needed for severe pain. Patient not taking: Reported on 12/06/2014 01/28/14  Azalia BilisKevin Campos, MD  predniSONE (DELTASONE) 10 MG tablet Take 2 tablets (20 mg total) by mouth daily with breakfast. 12/10/14   Earley FavorGail Laquanda Bick, NP   BP 149/77 mmHg  Pulse 80  Temp(Src) 98.4 F (36.9 C) (Oral)  Resp 14  SpO2 98%  LMP 10/16/2014 (Within Months) Physical Exam  Constitutional: She is oriented to person, place, and time. She appears well-developed and well-nourished.  HENT:  Head: Normocephalic.  Eyes: Pupils are  equal, round, and reactive to light.  Neck: Normal range of motion.  Cardiovascular: Normal rate and regular rhythm.   Pulmonary/Chest: She is in respiratory distress. She has wheezes. She exhibits no tenderness.  Abdominal: Soft. She exhibits no distension. There is no tenderness.  Musculoskeletal: Normal range of motion.  Neurological: She is alert and oriented to person, place, and time.  Skin: Skin is warm and dry.  Nursing note and vitals reviewed.   ED Course  Procedures (including critical care time) Labs Review Labs Reviewed  CBC - Abnormal; Notable for the following:    Hemoglobin 9.1 (*)    HCT 29.6 (*)    MCV 71.3 (*)    MCH 21.9 (*)    RDW 17.1 (*)    All other components within normal limits  COMPREHENSIVE METABOLIC PANEL - Abnormal; Notable for the following:    Glucose, Bld 117 (*)    Creatinine, Ser 1.19 (*)    Calcium 8.8 (*)    GFR calc non Af Amer 53 (*)    All other components within normal limits  I-STAT CHEM 8, ED - Abnormal; Notable for the following:    Creatinine, Ser 1.20 (*)    Glucose, Bld 115 (*)    Calcium, Ion 1.09 (*)    Hemoglobin 10.5 (*)    HCT 31.0 (*)    All other components within normal limits  CBC WITH DIFFERENTIAL/PLATELET  I-STAT TROPOININ, ED  Rosezena SensorI-STAT TROPOININ, ED    Imaging Review Dg Chest 2 View  12/10/2014   CLINICAL DATA:  Acute onset of shortness of breath and chest pain. Initial encounter.  EXAM: CHEST  2 VIEW  COMPARISON:  Chest radiograph performed 12/06/2014  FINDINGS: The lungs are well-aerated. Mild vascular congestion is noted. There is no evidence of focal opacification, pleural effusion or pneumothorax.  The heart is borderline enlarged. No acute osseous abnormalities are seen.  IMPRESSION: Mild vascular congestion and borderline cardiomegaly; lungs remain grossly clear.   Electronically Signed   By: Roanna RaiderJeffery  Chang M.D.   On: 12/10/2014 02:01     EKG Interpretation None     this was examined after she received an  albuterol Atrovent nebulizer she's no longer wheezing but speaking in short sentences and appears subjectively short of breath or lower sats are sitting at 98-100% Reexamined breath sounds remain clear to auscultation patient is no longer having chest discomfort troponin has been reviewed at his 0 Patient has been reexamined she has no longer any wheezing she has been ambulating the hall and has maintained a sat greater than 95% does not complain of any dizziness or lightheadedness she'll be discharged home with a prescription for prednisone she has also been given cautions about her blood pressure return parameters and a resource list to help her find a primary care physician MDM   Final diagnoses:  Asthma exacerbation        Earley FavorGail Dartanyon Frankowski, NP 12/10/14 [id number]  Paula LibraJohn Molpus, MD 12/10/14 [phone]

## 2014-12-10 NOTE — Discharge Instructions (Signed)
Asthma °Asthma is a condition of the lungs in which the airways tighten and narrow. Asthma can make it hard to breathe. Asthma cannot be cured, but medicine and lifestyle changes can help control it. Asthma may be started (triggered) by: °· Animal skin flakes (dander). °· Dust. °· Cockroaches. °· Pollen. °· Mold. °· Smoke. °· Cleaning products. °· Hair sprays or aerosol sprays. °· Paint fumes or strong smells. °· Cold air, weather changes, and winds. °· Crying or laughing hard. °· Stress. °· Certain medicines or drugs. °· Foods, such as dried fruit, potato chips, and sparkling grape juice. °· Infections or conditions (colds, flu). °· Exercise. °· Certain medical conditions or diseases. °· Exercise or tiring activities. °HOME CARE  °· Take medicine as told by your doctor. °· Use a peak flow meter as told by your doctor. A peak flow meter is a tool that measures how well the lungs are working. °· Record and keep track of the peak flow meter's readings. °· Understand and use the asthma action plan. An asthma action plan is a written plan for taking care of your asthma and treating your attacks. °· To help prevent asthma attacks: °¨ Do not smoke. Stay away from secondhand smoke. °¨ Change your heating and air conditioning filter often. °¨ Limit your use of fireplaces and wood stoves. °¨ Get rid of pests (such as roaches and mice) and their droppings. °¨ Throw away plants if you see mold on them. °¨ Clean your floors. Dust regularly. Use cleaning products that do not smell. °¨ Have someone vacuum when you are not home. Use a vacuum cleaner with a HEPA filter if possible. °¨ Replace carpet with wood, tile, or vinyl flooring. Carpet can trap animal skin flakes and dust. °¨ Use allergy-proof pillows, mattress covers, and box spring covers. °¨ Wash bed sheets and blankets every week in hot water and dry them in a dryer. °¨ Use blankets that are made of polyester or cotton. °¨ Clean bathrooms and kitchens with bleach. If  possible, have someone repaint the walls in these rooms with mold-resistant paint. Keep out of the rooms that are being cleaned and painted. °¨ Wash hands often. °GET HELP IF: °· You have make a whistling sound when breaking (wheeze), have shortness of breath, or have a cough even if taking medicine to prevent attacks. °· The colored mucus you cough up (sputum) is thicker than usual. °· The colored mucus you cough up changes from clear or white to yellow, green, gray, or bloody. °· You have problems from the medicine you are taking such as: °¨ A rash. °¨ Itching. °¨ Swelling. °¨ Trouble breathing. °· You need reliever medicines more than 2-3 times a week. °· Your peak flow measurement is still at 50-79% of your personal best after following the action plan for 1 hour. °· You have a fever. °GET HELP RIGHT AWAY IF:  °· You seem to be worse and are not responding to medicine during an asthma attack. °· You are short of breath even at rest. °· You get short of breath when doing very little activity. °· You have trouble eating, drinking, or talking. °· You have chest pain. °· You have a fast heartbeat. °· Your lips or fingernails start to turn blue. °· You are light-headed, dizzy, or faint. °· Your peak flow is less than 50% of your personal best. °MAKE SURE YOU:  °· Understand these instructions. °· Will watch your condition. °· Will get help right away if you   are not doing well or get worse. Document Released: 12/17/2007 Document Revised: 11/14/2013 Document Reviewed: 01/27/2013 [facility] Patient Information 2015 [place]. This information is not intended to replace advice given to you by your health care provider. Make sure you discuss any questions you have with your health care provider.   Emergency Department Resource Guide 1) Find a Doctor and Pay Out of Pocket Although you won't have to find out who is covered by your insurance plan, it is a good idea to ask around and get recommendations. You will  then need to call the office and see if the doctor you have chosen will accept you as a new patient and what types of options they offer for patients who are self-pay. Some doctors offer discounts or will set up payment plans for their patients who do not have insurance, but you will need to ask so you aren't surprised when you get to your appointment.  2) Contact Your Local Health Department Not all health departments have doctors that can see patients for sick visits, but many do, so it is worth a call to see if yours does. If you don't know where your local health department is, you can check in your phone book. The CDC also has a tool to help you locate your state's health department, and many state websites also have listings of all of their local health departments.  3) Find a Walk-in Clinic If your illness is not likely to be very severe or complicated, you may want to try a walk in clinic. These are popping up all over the country in pharmacies, drugstores, and shopping centers. They're usually staffed by nurse practitioners or physician assistants that have been trained to treat common illnesses and complaints. They're usually fairly quick and inexpensive. However, if you have serious medical issues or chronic medical problems, these are probably not your best option.  No Primary Care Doctor: - Call Health Connect at  [phone] - they can help you locate a primary care doctor that  accepts your insurance, provides certain services, etc. - Physician Referral Service- [phone]  Chronic Pain Problems: Organization         Address  Phone   Notes  [facility]  [phone] Patients need to be referred by their primary care doctor.   Medication Assistance: Organization         Address  Phone   Notes  [facility] [address] [place], [place] [address] [phone] --Must be a resident of [place] [facility] -- Must have NO  insurance coverage whatsoever (no Medicaid/ Medicare, etc.) -- The pt. MUST have a primary care doctor that directs their care regularly and follows them in the community   [facility]  [phone]   [employer]  [phone]    Agencies that provide inexpensive medical care: Organization         Address  Phone   Notes  [facility]  [phone]   [facility]    [phone]   [facility] [address] [place], [place] [address] [phone]) [phone]   [facility] [address], [place] [phone]   [facility]    [phone]   [facility]    [phone]   [facility]  [address], [place] Phone:  [phone], Fax:  [phone] Hours of Operation:  9 am - 6 pm, M-F.  Also accepts Medicaid/Medicare and self-pay.  °[facility] ° [address], [place] Phone: [phone], Fax: [phone]. Hours of Operation:  8:30 am - 5:30 pm, M-F.  Also accepts Medicaid and self-pay.  °[facility] [place] [address], [place] Phone: [phone]   °[facility] [address], [place], [place] ([phone], Ext. 123 Mondays & Thursdays: 7-9 AM.  First 15 patients are seen on a first come, first serve basis. °  ° °Medicaid-accepting [place] Providers: ° °Organization         Address  Phone   Notes  °[facility] [address], [place] [phone] Also accepts self-pay patients.  °[facility] [address], [place] ° ([phone]) [phone]   °[facility] [address], [place] ([phone]) [phone]   °[facility] [address] [place] [address], [place] [phone]) [phone]   °Veita Bland [address], [place]  ° [phone]) [phone] Only accepts [place] Access Medicaid patients after they have  their name applied to their card.  ° °Self-Pay (no insurance) in [place]: ° °Organization         Address  Phone   Notes  °Sickle Cell Patients, [facility] [address], [place] [phone]) [phone]   °[facility] [address], [place] ([phone]) [phone]   °[facility] ° [address], [place] [phone]) [phone]   °[facility]/Dr. Osei-Bonsu ° [address] [place] [address], [place] or [address], [place] [phone] Phone number for both [place] and [place] locations is the same.  °[facility] [address], [place] [phone]) [phone]   °[facility] [address] [place] [address], [place] or [address] ([phone]) [phone] °([phone]) [phone]   °[facility] [address], [place] ([phone]) [phone], phone; [phone], fax Sees patients 1st and 3rd Saturday of every month.  Must not qualify for public or private insurance (i.e. Medicaid, Medicare, [place] Health Choice, Veterans' Benefits) • Household income should be no more than 200% of the poverty level •The clinic cannot treat you if you are pregnant or think you are pregnant • Sexually transmitted diseases are not treated at the clinic.  ° ° °Dental Care: °Organization         Address  Phone  Notes  °[facility] [address], [place] [phone]) [phone] Accepts children up to age 21 who are enrolled in Medicaid or [place] Health Choice; pregnant women with a Medicaid card; and children who have applied for Medicaid or [place] Health Choice, but were declined, whose parents can pay a reduced fee at time of service.  °[place] [facility] [place]  [address], [place] [phone]) [phone] Accepts children up to age 21 who are enrolled in Medicaid or [place] Health Choice; pregnant women with a Medicaid card; and children who have applied  for Medicaid or  Health Choice, but were declined, whose parents can pay a reduced fee at time of service.  °[facility] ° [address], [place] [phone]) [phone] Patients are seen by appointment only. Walk-ins are not accepted. [facility] will see patients 18 years   of age and older. °Monday - Tuesday (8am-5pm) °Most Wednesdays (8:30-5pm) °$30 per visit, cash only  °[facility] ° [address], [place] [phone]) [phone] Patients are seen by appointment only. Walk-ins are not accepted. [facility] will see patients 18 years of age and older. °One Wednesday Evening (Monthly: Volunteer Based).  $30 per visit, cash only  °[facility]  [phone] for adults; Children under age 4, call Graduate Pediatric Dentistry at [phone]. Children aged 4-14, please call [phone] to request a pediatric application. ° Dental services are provided in all areas of dental care including fillings, crowns and bridges, complete and partial dentures, implants, gum treatment, root canals, and extractions. Preventive care is also provided. Treatment is provided to both adults and children. °Patients are selected via a lottery and there is often a waiting list. °  °[facility] [address], °[place] ° ([phone]) [phone] [email or web address] °  °[facility] [address], [place], [place] (336)[phone], Ext. 123 Second and Fourth Thursday of each month, opens at 6:30 AM; Clinic ends at 9 AM.  Patients are seen on a first-come first-served basis, and a limited number are seen during each clinic.  ° °[facility] ° [address], [place], [place] [phone]) [phone]   Eligibility Requirements °You must have lived in [place], [place], or [place] counties for at least the last three months. °  You cannot be eligible for state or federal sponsored healthcare insurance, including Veterans Administration,  Medicaid, or Medicare. °  You generally cannot be eligible for healthcare insurance through your employer.  °  How to apply: °Eligibility screenings are held every Tuesday and Wednesday afternoon from 1:00 pm until 4:00 pm. You do not need an appointment for the interview!  °[facility] [address], [place], [place] [phone]   °[facility]  [phone]   °[facility]  336-703-3100   °[facility]  336-570-6415   ° °Behavioral Health Resources in the Community: °Intensive Outpatient Programs °Organization         Address  Phone  Notes  °[facility] [address], [place], [place] [phone]   °[facility] [address], [place], [place] [phone]   °[facility] [address], [place], [place] ° 336-882-2125   °[facility] [address],  °[place], [place] 1-800-853-5163 or 336-641-4981   °Substance Abuse Resources °Organization         Address  Phone  Notes  °[facility]  336-882-2125   °[facility]  336-784-9470   °[facility]  336-285-9073   °[facility]  336-845-3988   °Residential & Outpatient Substance Abuse Program  1-800-659-3381   °Psychological Services °Organization         Address  Phone  Notes  °[facility]  336- [phone]   °[facility]  336- 378-7881   °[facility] [address], [place] 1-800-853-5163 or 336-641-4981   ° °Mobile Crisis Teams °Organization         Address  Phone  Notes  °[facility], Mobile Crisis Care Unit  1-877-626-1772   °Assertive °[facility] ° [address] [place], [place] 336-834-9664   °Sharon DeEsch [address] °  336-554-5454   ° °Self-Help/Support Groups °Organization           Address  Phone             Notes  [facility] [place] - variety of support  groups  336- I7437963 Call for more information  Narcotics Anonymous (NA), [facility] [address], [employer] [place]  2 meetings at this location   Statistician         Address  Phone  Notes  [facility] [address],    [place]  1-610-960-4540   [facility]  [address] [place] [address], [place], [place] [phone]   [facility] [address] [place], [place] [phone] Admissions: 8am-3pm M-F  [facility] [address],    [place], [place] [phone]   [facility] [address] [place], [place], [place] [phone]   [facility] [address],  [place], [place] [phone]   [facility] Intensive Outpatient [address], [place], [place] [phone]   [facility] ([facility] [address] [place]  [place], [place] 3-295-188-4166 or 585-625-0340   [facility] ([facility]) [address], [place], [place] 323-557-3220 Accepts Medicaid  [facility] [place] [address],  [place] 2-542-706-2376 Substance Abuse/Addiction Treatment   [facility] Organization         Address  Phone  Notes  [facility]  725-709-8480   Angie Fava, PhD [address] [place] [place], [place]   412-782-1527 or ([phone]   [facility]   [address] [place], [place] [phone]   [facility] [address], [place], [place] [phone] Insurance/Medicaid/sponsorship through [facility] Families [address]                                    [place], [place] [phone] Therapy/tele-psych/case  [facility] [address], [place] [phone]    Dr. Lolly Mustache  [phone]   [facility]  [employer] [facility] Dept. 1) [address] [place] 2) [address] [place] 3)   [address], [place] [phone]  [phone]   [facility] Child Abuse Hotline 862-241-8404 or [phone] (After Hours)      Please take the Seroquel is as directed 2 tablets with breakfast daily for the next 7 days also use your inhaler every 4-6 hours while awake for the next 2 days and then as needed please return anytime he become short of breath your treatments are not effective or you become concerned about her breathing. You also be given a resources to help him find a primary care physician your blood pressure is borderline elevated and will most likely need medical treatment but this needs to be rechecked again when you're not having respiratory issues

## 2014-12-10 NOTE — ED Notes (Signed)
Pt. Ambulated in hallway without difficulty, denies any SOB nor pain. O2 sat stable at >95% . Back  To bed without any distress.

## 2015-03-06 ENCOUNTER — Emergency Department (HOSPITAL_COMMUNITY)
Admission: EM | Admit: 2015-03-06 | Discharge: 2015-03-06 | Disposition: A | Discharge status: Home / Self Care | Payer: Medicaid Other | Attending: Physician Assistant | Admitting: Physician Assistant

## 2015-03-06 ENCOUNTER — Encounter (HOSPITAL_COMMUNITY): Payer: Self-pay

## 2015-03-06 DIAGNOSIS — N72 Inflammatory disease of cervix uteri: Secondary | ICD-10-CM | POA: Diagnosis not present

## 2015-03-06 DIAGNOSIS — J45909 Unspecified asthma, uncomplicated: Secondary | ICD-10-CM | POA: Insufficient documentation

## 2015-03-06 DIAGNOSIS — Z9851 Tubal ligation status: Secondary | ICD-10-CM | POA: Diagnosis not present

## 2015-03-06 DIAGNOSIS — IMO0001 Reserved for inherently not codable concepts without codable children: Secondary | ICD-10-CM

## 2015-03-06 DIAGNOSIS — I10 Essential (primary) hypertension: Secondary | ICD-10-CM | POA: Diagnosis not present

## 2015-03-06 DIAGNOSIS — R030 Elevated blood-pressure reading, without diagnosis of hypertension: Secondary | ICD-10-CM

## 2015-03-06 DIAGNOSIS — M545 Low back pain: Secondary | ICD-10-CM | POA: Diagnosis not present

## 2015-03-06 DIAGNOSIS — Z7951 Long term (current) use of inhaled steroids: Secondary | ICD-10-CM | POA: Diagnosis not present

## 2015-03-06 DIAGNOSIS — R110 Nausea: Secondary | ICD-10-CM | POA: Diagnosis not present

## 2015-03-06 DIAGNOSIS — R109 Unspecified abdominal pain: Secondary | ICD-10-CM | POA: Diagnosis present

## 2015-03-06 DIAGNOSIS — Z79899 Other long term (current) drug therapy: Secondary | ICD-10-CM | POA: Diagnosis not present

## 2015-03-06 DIAGNOSIS — R350 Frequency of micturition: Secondary | ICD-10-CM | POA: Diagnosis not present

## 2015-03-06 DIAGNOSIS — E669 Obesity, unspecified: Secondary | ICD-10-CM | POA: Insufficient documentation

## 2015-03-06 LAB — CBC
HCT: 32.7 % — ABNORMAL LOW (ref 36.0–46.0)
Hemoglobin: 9.7 g/dL — ABNORMAL LOW (ref 12.0–15.0)
MCH: 21 pg — ABNORMAL LOW (ref 26.0–34.0)
MCHC: 29.7 g/dL — AB (ref 30.0–36.0)
MCV: 70.6 fL — AB (ref 78.0–100.0)
PLATELETS: 278 10*3/uL (ref 150–400)
RBC: 4.63 MIL/uL (ref 3.87–5.11)
RDW: 16.3 % — AB (ref 11.5–15.5)
WBC: 6.5 10*3/uL (ref 4.0–10.5)

## 2015-03-06 LAB — URINALYSIS, ROUTINE W REFLEX MICROSCOPIC
BILIRUBIN URINE: NEGATIVE
Glucose, UA: NEGATIVE mg/dL
HGB URINE DIPSTICK: NEGATIVE
KETONES UR: NEGATIVE mg/dL
Leukocytes, UA: NEGATIVE
Nitrite: NEGATIVE
PH: 6 (ref 5.0–8.0)
Protein, ur: NEGATIVE mg/dL
SPECIFIC GRAVITY, URINE: 1.015 (ref 1.005–1.030)
Urobilinogen, UA: 0.2 mg/dL (ref 0.0–1.0)

## 2015-03-06 LAB — I-STAT CHEM 8, ED
BUN: 7 mg/dL (ref 6–20)
CALCIUM ION: 1.16 mmol/L (ref 1.12–1.23)
Chloride: 102 mmol/L (ref 101–111)
Creatinine, Ser: 0.9 mg/dL (ref 0.44–1.00)
GLUCOSE: 90 mg/dL (ref 65–99)
HCT: 33 % — ABNORMAL LOW (ref 36.0–46.0)
Hemoglobin: 11.2 g/dL — ABNORMAL LOW (ref 12.0–15.0)
Potassium: 3.5 mmol/L (ref 3.5–5.1)
SODIUM: 140 mmol/L (ref 135–145)
TCO2: 26 mmol/L (ref 0–100)

## 2015-03-06 LAB — WET PREP, GENITAL
Clue Cells Wet Prep HPF POC: NONE SEEN
Trich, Wet Prep: NONE SEEN
YEAST WET PREP: NONE SEEN

## 2015-03-06 MED ORDER — DOXYCYCLINE HYCLATE 100 MG PO CAPS: 100.0000 mg | ORAL_CAPSULE | Freq: Two times a day (BID) | ORAL | Status: DC

## 2015-03-06 MED ORDER — AZITHROMYCIN 250 MG PO TABS
1000.0000 mg | ORAL_TABLET | Freq: Once | ORAL | Status: AC
  Administered 2015-03-06: 1000 mg via ORAL
  Filled 2015-03-06: qty 4

## 2015-03-06 MED ORDER — CEFTRIAXONE SODIUM 250 MG IJ SOLR
250.0000 mg | Freq: Once | INTRAMUSCULAR | Status: AC
  Administered 2015-03-06: 250 mg via INTRAMUSCULAR
  Filled 2015-03-06: qty 250

## 2015-03-06 MED ORDER — ONDANSETRON HCL 4 MG/2ML IJ SOLN: 4.0000 mg | Freq: Once | INTRAMUSCULAR | Status: DC

## 2015-03-06 MED ORDER — ONDANSETRON 4 MG PO TBDP
4.0000 mg | ORAL_TABLET | Freq: Once | ORAL | Status: AC
  Administered 2015-03-06: 4 mg via ORAL
  Filled 2015-03-06: qty 1

## 2015-03-06 MED ORDER — LIDOCAINE HCL (PF) 1 % IJ SOLN
0.9000 mL | Freq: Once | INTRAMUSCULAR | Status: AC
  Administered 2015-03-06: 0.9 mL
  Filled 2015-03-06: qty 5

## 2015-03-06 MED ORDER — ACETAMINOPHEN 500 MG PO TABS
1000.0000 mg | ORAL_TABLET | Freq: Once | ORAL | Status: AC
  Administered 2015-03-06: 1000 mg via ORAL
  Filled 2015-03-06: qty 2

## 2015-03-06 NOTE — ED Notes (Signed)
Patient c/o bilateral lower abdominal pain and bilateral low back pain. Patient c/o urinary frequency. Patient denies any vaginal discharge or fever.

## 2015-03-06 NOTE — ED Notes (Signed)
Initial Contact - pt A+Ox4, reports low abd and low back pain, abd s/nt/nd.  Endorses mild nausea.  MAEI, self repositioning for comfort.  Skin PWD.  Speaking full/clear sentences, rr even/un-lab.  Med per [facility].  NAD.

## 2015-03-06 NOTE — Discharge Instructions (Signed)
Take doxycycline twice daily for 14 days. You may continue to take Tylenol for ear pain. Follow-up with your primary care physician. No sexual intercourse for 10 days after completing the antibiotic.  Cervicitis Cervicitis is a soreness and swelling (inflammation) of the cervix. Your cervix is located at the bottom of your uterus. It opens up to the vagina. CAUSES   Sexually transmitted infections (STIs).   Allergic reaction.   Medicines or birth control devices that are put in the vagina.   Injury to the cervix.   Bacterial infections.  RISK FACTORS You are at greater risk if you:  Have unprotected sexual intercourse.  Have sexual intercourse with many partners.  Began sexual intercourse at an early age.  Have a history of STIs. SYMPTOMS  There may be no symptoms. If symptoms occur, they may include:   Gray, white, yellow, or bad-smelling vaginal discharge.   Pain or itching of the area outside the vagina.   Painful sexual intercourse.   Lower abdominal or lower back pain, especially during intercourse.   Frequent urination.   Abnormal vaginal bleeding between periods, after sexual intercourse, or after menopause.   Pressure or a heavy feeling in the pelvis.  DIAGNOSIS  Diagnosis is made after a pelvic exam. Other tests may include:   Examination of any discharge under a microscope (wet prep).   A Pap test.  TREATMENT  Treatment will depend on the cause of cervicitis. If it is caused by an STI, both you and your partner will need to be treated. Antibiotic medicines will be given.  HOME CARE INSTRUCTIONS   Do not have sexual intercourse until your health care provider says it is okay.   Do not have sexual intercourse until your partner has been treated, if your cervicitis is caused by an STI.   Take your antibiotics as directed. Finish them even if you start to feel better.  SEEK MEDICAL CARE IF:  Your symptoms come back.   You have a  fever.  MAKE SURE YOU:   Understand these instructions.  Will watch your condition.  Will get help right away if you are not doing well or get worse. Document Released: 06/30/2005 Document Revised: 07/05/2013 Document Reviewed: 12/22/2012 [facility] Patient Information 2015 [place], [place]. This information is not intended to replace advice given to you by your health care provider. Make sure you discuss any questions you have with your health care provider.

## 2015-03-06 NOTE — ED Provider Notes (Signed)
CSN: [id number]     Arrival date & time 03/06/15  1324 History   First MD Initiated Contact with Patient 03/06/15 1505     Chief Complaint  Patient presents with  . Abdominal Pain     (Consider location/radiation/quality/duration/timing/severity/associated sxs/prior Treatment) HPI Comments: 49 year old female with a past medical history of asthma and hypertension presenting with bilateral lower back pain 1 week. No known injury or trauma. Reports soreness to both sides of her back that is gradually worsening and now over the past 2 days developed discomfort in her suprapubic area with associated increased urinary frequency. Reports she has been urinating more than normal, especially at night. She is concerned she may have a UTI as she "never drinks water". Drinks about 12 sodas daily and is trying to start drinking water. Back pain occasionally worsens night when laying down. No alleviating factors. Denies fever, chills, vomiting or diarrhea. Reports mild nausea. Denies pain, numbness or tingling radiating down extremities. No loss of control bowels/bladder or saddle anesthesia.  Patient is a 49 y.o. female presenting with abdominal pain. The history is provided by the patient.  Abdominal Pain Associated symptoms: nausea     Past Medical History  Diagnosis Date  . Asthma   . Hypertension    Past Surgical History  Procedure Laterality Date  . Tubal ligation    . Knee surgery     Family History  Problem Relation Age of Onset  . Diabetes Mother   . Cancer Mother    Social History  Substance Use Topics  . Smoking status: Never Smoker   . Smokeless tobacco: None  . Alcohol Use: No   OB History    No data available     Review of Systems  Gastrointestinal: Positive for nausea and abdominal pain.  Endocrine: Positive for polyuria.  Genitourinary: Positive for frequency.  Musculoskeletal: Positive for back pain.  All other systems reviewed and are negative.     Allergies   Codeine and Ketorolac tromethamine  Home Medications   Prior to Admission medications   Medication Sig Start Date End Date Taking? Authorizing Provider  acetaminophen (TYLENOL) 325 MG tablet Take 650 mg by mouth every 6 (six) hours as needed for mild pain or moderate pain.   Yes Historical Provider, MD  albuterol (PROVENTIL HFA;VENTOLIN HFA) 108 (90 BASE) MCG/ACT inhaler Inhale 1-2 puffs into the lungs every 6 (six) hours as needed for wheezing or shortness of breath.   Yes Historical Provider, MD  fluticasone (FLOVENT HFA) 110 MCG/ACT inhaler Inhale 1 puff into the lungs 2 (two) times daily. 12/06/14  Yes Linwood Dibbles, MD  doxycycline (VIBRAMYCIN) 100 MG capsule Take 1 capsule (100 mg total) by mouth 2 (two) times daily. One po bid x 14 days 03/06/15   Nada Boozer Jakell Trusty, PA-C  ondansetron (ZOFRAN ODT) 8 MG disintegrating tablet Take 1 tablet (8 mg total) by mouth every 8 (eight) hours as needed for nausea or vomiting. Patient not taking: Reported on 12/06/2014 01/28/14   Azalia Bilis, MD  oxyCODONE-acetaminophen (PERCOCET/ROXICET) 5-325 MG per tablet Take 1 tablet by mouth every 4 (four) hours as needed for severe pain. Patient not taking: Reported on 12/06/2014 01/28/14   Azalia Bilis, MD  predniSONE (DELTASONE) 10 MG tablet Take 2 tablets (20 mg total) by mouth daily with breakfast. Patient not taking: Reported on 03/06/2015 12/10/14   Earley Favor, NP   BP 177/87 mmHg  Pulse 74  Temp(Src) 98.2 F (36.8 C) (Oral)  Resp 15  Ht 5'  4" (1.626 m)  Wt 220 lb (99.791 kg)  BMI 37.74 kg/m2  SpO2 99%  LMP 12/04/2014 Physical Exam  Constitutional: She is oriented to person, place, and time. She appears well-developed and well-nourished. No distress.  Obese.  HENT:  Head: Normocephalic and atraumatic.  Mouth/Throat: Oropharynx is clear and moist.  Eyes: Conjunctivae and EOM are normal.  Neck: Normal range of motion. Neck supple. No spinous process tenderness and no muscular tenderness present.   Cardiovascular: Normal rate, regular rhythm and normal heart sounds.   Pulmonary/Chest: Effort normal and breath sounds normal. No respiratory distress.  Abdominal: Soft. Bowel sounds are normal. She exhibits no distension. There is tenderness in the suprapubic area. There is CVA tenderness (mild, right). There is no rigidity, no rebound and no guarding.  Genitourinary: Cervix exhibits discharge. Cervix exhibits no motion tenderness and no friability. Right adnexum displays no mass, no tenderness and no fullness. Left adnexum displays no mass, no tenderness and no fullness. No erythema, tenderness or bleeding in the vagina. Vaginal discharge found.  Exam limited by body habitus.  Musculoskeletal: Normal range of motion. She exhibits no edema.       Lumbar back: She exhibits normal range of motion, no bony tenderness and no edema.  Neurological: She is alert and oriented to person, place, and time. She has normal strength. No sensory deficit.  Strength lower extremities 5/5 and equal bilateral. Sensation intact. Normal gait.  Skin: Skin is warm and dry. No rash noted. She is not diaphoretic.  Psychiatric: She has a normal mood and affect. Her behavior is normal.  Nursing note and vitals reviewed.   ED Course  Procedures (including critical care time) Labs Review Labs Reviewed  WET PREP, GENITAL - Abnormal; Notable for the following:    WBC, Wet Prep HPF POC FEW (*)    All other components within normal limits  CBC - Abnormal; Notable for the following:    Hemoglobin 9.7 (*)    HCT 32.7 (*)    MCV 70.6 (*)    MCH 21.0 (*)    MCHC 29.7 (*)    RDW 16.3 (*)    All other components within normal limits  I-STAT CHEM 8, ED - Abnormal; Notable for the following:    Hemoglobin 11.2 (*)    HCT 33.0 (*)    All other components within normal limits  URINALYSIS, ROUTINE W REFLEX MICROSCOPIC (NOT AT [facility])  I-STAT BETA HCG BLOOD, ED ([facility], [facility], [facility] ONLY)  GC/CHLAMYDIA PROBE AMP ([place]) NOT  AT [facility]    Imaging Review No results found. I have personally reviewed and evaluated these images and lab results as part of my medical decision-making.   EKG Interpretation None      MDM   Final diagnoses:  Cervicitis  Elevated blood pressure   Nontoxic appearing, NAD. AF VSS. Hypertensive at 177/87. Patient has been off of her blood pressure medication for a year as her PCP one year ago took her off of this and she has not yet followed up. No red flags concerning patient's back pain. No s/s of central cord compression or cauda equina. Lower extremities are neurovascularly intact and patient is ambulating without difficulty. Regarding associated abdominal pain, pain is suprapubic, more so on the right. No fever, leukocytosis, vomiting or appetite change concerning for appendicitis. Doubt ovarian torsion. UA negative. Labs without acute finding. Pelvic exam significant for cervical discharge. Will treat cervicitis with Rocephin, azithromycin and discharged home with 2 weeks of doxycycline. GC/Chlamydia  cultures pending. Pain significantly improved after receiving Tylenol. On repeat exam, abdomen is soft with minimal tenderness. Advised her to discuss with her PCP about elevated blood pressure. Follow-up with PCP. Stable for discharge. Return precautions given. Patient states understanding of treatment care plan and is agreeable.  Kathrynn Speed, PA-C 03/06/15 1727  Kathrynn Speed, PA-C 03/06/15 1737  Courteney Randall An, MD 03/06/15 1610

## 2015-03-08 LAB — GC/CHLAMYDIA PROBE AMP (~~LOC~~) NOT AT ARMC
Chlamydia: NEGATIVE
NEISSERIA GONORRHEA: NEGATIVE

## 2015-05-12 ENCOUNTER — Emergency Department (HOSPITAL_COMMUNITY)
Admission: EM | Admit: 2015-05-12 | Discharge: 2015-05-12 | Disposition: A | Discharge status: Home / Self Care | Payer: Medicaid Other | Attending: Emergency Medicine | Admitting: Emergency Medicine

## 2015-05-12 ENCOUNTER — Encounter (HOSPITAL_COMMUNITY): Payer: Self-pay | Admitting: *Deleted

## 2015-05-12 DIAGNOSIS — Z7951 Long term (current) use of inhaled steroids: Secondary | ICD-10-CM | POA: Insufficient documentation

## 2015-05-12 DIAGNOSIS — R070 Pain in throat: Secondary | ICD-10-CM | POA: Diagnosis not present

## 2015-05-12 DIAGNOSIS — J45909 Unspecified asthma, uncomplicated: Secondary | ICD-10-CM | POA: Diagnosis not present

## 2015-05-12 DIAGNOSIS — I10 Essential (primary) hypertension: Secondary | ICD-10-CM | POA: Diagnosis not present

## 2015-05-12 DIAGNOSIS — Z79899 Other long term (current) drug therapy: Secondary | ICD-10-CM | POA: Insufficient documentation

## 2015-05-12 MED ORDER — DIPHENHYDRAMINE HCL 25 MG PO CAPS
25.0000 mg | ORAL_CAPSULE | Freq: Once | ORAL | Status: AC
  Administered 2015-05-12: 25 mg via ORAL
  Filled 2015-05-12: qty 1

## 2015-05-12 NOTE — ED Provider Notes (Signed)
CSN: [id number]     Arrival date & time 05/12/15  1733 History   First MD Initiated Contact with Patient 05/12/15 1743     Chief Complaint  Patient presents with  . Throat Irritation After Eating Peanuts       HPI  Patient presents evaluation of pain in her throat. States that she had a severe allergic reaction. This is a child. 2 days ago decided 80 seconds bar "just to see". States about an hour later throat felt tight and painful. Does not feel tight. She has no difficulty breathing. States he still has a sore throat and has to "drink warm Tea". No rash.  No  itching no sensation of swelling or discomfort of breathing no wheezing.  Past Medical History  Diagnosis Date  . Asthma   . Hypertension    Past Surgical History  Procedure Laterality Date  . Tubal ligation    . Knee surgery     Family History  Problem Relation Age of Onset  . Diabetes Mother   . Cancer Mother    Social History  Substance Use Topics  . Smoking status: Never Smoker   . Smokeless tobacco: None  . Alcohol Use: No   OB History    No data available     Review of Systems  Constitutional: Negative for fever, chills, diaphoresis, appetite change and fatigue.  HENT: Positive for sore throat. Negative for mouth sores and trouble swallowing.   Eyes: Negative for visual disturbance.  Respiratory: Negative for cough, chest tightness, shortness of breath and wheezing.   Cardiovascular: Negative for chest pain.  Gastrointestinal: Negative for nausea, vomiting, abdominal pain, diarrhea and abdominal distention.  Endocrine: Negative for polydipsia, polyphagia and polyuria.  Genitourinary: Negative for dysuria, frequency and hematuria.  Musculoskeletal: Negative for gait problem.  Skin: Negative for color change, pallor and rash.  Neurological: Negative for dizziness, syncope, light-headedness and headaches.  Hematological: Does not bruise/bleed easily.  Psychiatric/Behavioral: Negative for behavioral  problems and confusion.      Allergies  Codeine and Ketorolac tromethamine  Home Medications   Prior to Admission medications   Medication Sig Start Date End Date Taking? Authorizing Provider  acetaminophen (TYLENOL) 325 MG tablet Take 650 mg by mouth every 6 (six) hours as needed for mild pain or moderate pain.    Historical Provider, MD  albuterol (PROVENTIL HFA;VENTOLIN HFA) 108 (90 BASE) MCG/ACT inhaler Inhale 1-2 puffs into the lungs every 6 (six) hours as needed for wheezing or shortness of breath.    Historical Provider, MD  doxycycline (VIBRAMYCIN) 100 MG capsule Take 1 capsule (100 mg total) by mouth 2 (two) times daily. One po bid x 14 days 03/06/15   Nada Boozerobyn M Hess, PA-C  fluticasone (FLOVENT HFA) 110 MCG/ACT inhaler Inhale 1 puff into the lungs 2 (two) times daily. 12/06/14   Linwood DibblesJon Knapp, MD  ondansetron (ZOFRAN ODT) 8 MG disintegrating tablet Take 1 tablet (8 mg total) by mouth every 8 (eight) hours as needed for nausea or vomiting. Patient not taking: Reported on 12/06/2014 01/28/14   Azalia BilisKevin Campos, MD  oxyCODONE-acetaminophen (PERCOCET/ROXICET) 5-325 MG per tablet Take 1 tablet by mouth every 4 (four) hours as needed for severe pain. Patient not taking: Reported on 12/06/2014 01/28/14   Azalia BilisKevin Campos, MD  predniSONE (DELTASONE) 10 MG tablet Take 2 tablets (20 mg total) by mouth daily with breakfast. Patient not taking: Reported on 03/06/2015 12/10/14   Earley FavorGail Schulz, NP   BP 152/96 mmHg  Pulse 84  Temp(Src)  98.4 F (36.9 C) (Oral)  Resp 18  SpO2 98%  LMP 03/22/2015 (Exact Date) Physical Exam  Constitutional: She is oriented to person, place, and time. She appears well-developed and well-nourished. No distress.  HENT:  Head: Normocephalic.  Normal pharynx. Normal neck and pulmonary exam. No skin rash. No sign of allergic reaction.  Eyes: Conjunctivae are normal. Pupils are equal, round, and reactive to light. No scleral icterus.  Neck: Normal range of motion. Neck supple. No  thyromegaly present.  Cardiovascular: Normal rate and regular rhythm.  Exam reveals no gallop and no friction rub.   No murmur heard. Pulmonary/Chest: Effort normal and breath sounds normal. No respiratory distress. She has no wheezes. She has no rales.  Abdominal: Soft. Bowel sounds are normal. She exhibits no distension. There is no tenderness. There is no rebound.  Musculoskeletal: Normal range of motion.  Neurological: She is alert and oriented to person, place, and time.  Skin: Skin is warm and dry. No rash noted.  Psychiatric: She has a normal mood and affect. Her behavior is normal.    ED Course  Procedures (including critical care time) Labs Review Labs Reviewed - No data to display  Imaging Review No results found. I have personally reviewed and evaluated these images and lab results as part of my medical decision-making.   EKG Interpretation None      MDM   Final diagnoses:  Throat pain    She has a normal exam. No signs of allergic reaction. Given a dose of Benadryl here. Asked not to eat peanuts or anything that she knows think she may be allergic to. Recheck if any worsening symptoms. 48 hours after exposure I feel this is appropriate and safe disposition.    Rolland Porter, MD 05/12/15 [phone]

## 2015-05-12 NOTE — Discharge Instructions (Signed)
Don't eat peanuts, or anything that your are allergic to.  Benadryl as needed for any symptoms.

## 2015-05-12 NOTE — ED Notes (Signed)
Pt reports as a child she was allergic to peanuts, decided to try peanuts again 2 days ago, ate a snickers bar, since then has had throat irritation and pain. Pain 4/10. Able to speak in full sentences. Denies SOB.

## 2015-07-01 ENCOUNTER — Emergency Department (HOSPITAL_COMMUNITY)
Admission: EM | Admit: 2015-07-01 | Discharge: 2015-07-01 | Disposition: A | Discharge status: Home / Self Care | Payer: Medicaid Other | Attending: Emergency Medicine | Admitting: Emergency Medicine

## 2015-07-01 DIAGNOSIS — I10 Essential (primary) hypertension: Secondary | ICD-10-CM | POA: Diagnosis not present

## 2015-07-01 DIAGNOSIS — Z7951 Long term (current) use of inhaled steroids: Secondary | ICD-10-CM | POA: Insufficient documentation

## 2015-07-01 DIAGNOSIS — J45909 Unspecified asthma, uncomplicated: Secondary | ICD-10-CM | POA: Insufficient documentation

## 2015-07-01 DIAGNOSIS — R55 Syncope and collapse: Secondary | ICD-10-CM | POA: Diagnosis present

## 2015-07-01 DIAGNOSIS — F4320 Adjustment disorder, unspecified: Secondary | ICD-10-CM | POA: Insufficient documentation

## 2015-07-01 DIAGNOSIS — Z79899 Other long term (current) drug therapy: Secondary | ICD-10-CM | POA: Insufficient documentation

## 2015-07-01 DIAGNOSIS — F4321 Adjustment disorder with depressed mood: Secondary | ICD-10-CM

## 2015-07-01 LAB — CBC WITH DIFFERENTIAL/PLATELET
Basophils Absolute: 0 10*3/uL (ref 0.0–0.1)
Basophils Relative: 0 %
EOS ABS: 0.1 10*3/uL (ref 0.0–0.7)
Eosinophils Relative: 1 %
HCT: 30.8 % — ABNORMAL LOW (ref 36.0–46.0)
Hemoglobin: 9.4 g/dL — ABNORMAL LOW (ref 12.0–15.0)
Lymphocytes Relative: 34 %
Lymphs Abs: 2 10*3/uL (ref 0.7–4.0)
MCH: 21.4 pg — AB (ref 26.0–34.0)
MCHC: 30.5 g/dL (ref 30.0–36.0)
MCV: 70.2 fL — ABNORMAL LOW (ref 78.0–100.0)
MONO ABS: 0.6 10*3/uL (ref 0.1–1.0)
Monocytes Relative: 10 %
NEUTROS PCT: 55 %
Neutro Abs: 3.2 10*3/uL (ref 1.7–7.7)
Platelets: 255 10*3/uL (ref 150–400)
RBC: 4.39 MIL/uL (ref 3.87–5.11)
RDW: 16.7 % — ABNORMAL HIGH (ref 11.5–15.5)
WBC: 5.9 10*3/uL (ref 4.0–10.5)

## 2015-07-01 LAB — BASIC METABOLIC PANEL
Anion gap: 10 (ref 5–15)
BUN: 8 mg/dL (ref 6–20)
CALCIUM: 8.9 mg/dL (ref 8.9–10.3)
CHLORIDE: 106 mmol/L (ref 101–111)
CO2: 26 mmol/L (ref 22–32)
CREATININE: 0.89 mg/dL (ref 0.44–1.00)
Glucose, Bld: 92 mg/dL (ref 65–99)
Potassium: 3.3 mmol/L — ABNORMAL LOW (ref 3.5–5.1)
SODIUM: 142 mmol/L (ref 135–145)

## 2015-07-01 LAB — CBG MONITORING, ED: Glucose-Capillary: 90 mg/dL (ref 65–99)

## 2015-07-01 NOTE — ED Notes (Signed)
Pt alert and oriented x4. Respirations even and unlabored, bilateral symmetrical rise and fall of chest. Skin warm and dry. In no acute distress. Denies needs.   

## 2015-07-01 NOTE — ED Provider Notes (Signed)
CSN: [id number]     Arrival date & time 07/01/15  1145 History   First MD Initiated Contact with Patient 07/01/15 1210     Chief Complaint  Patient presents with  . Near Syncope     (Consider location/radiation/quality/duration/timing/severity/associated sxs/prior Treatment) HPI Comments: Patient is a 49 year old female with history of asthma. She presents for evaluation of near syncope. She found out this morning that her grandmother who is a patient here in the hospital in the intensive care unit had passed away. She came here to pay her wrist back since her one last time when she began to feel lightheaded and near syncopal. She was brought down here for evaluation of this. She is now feeling somewhat better but does admit to feeling stressed out. She denies any chest pain or difficulty breathing.  Patient is a 49 y.o. female presenting with near-syncope. The history is provided by the patient.  Near Syncope This is a new problem. The current episode started less than 1 hour ago. The problem occurs constantly. The problem has not changed since onset.Pertinent negatives include no chest pain and no shortness of breath. Nothing aggravates the symptoms.    Past Medical History  Diagnosis Date  . Asthma   . Hypertension    Past Surgical History  Procedure Laterality Date  . Tubal ligation    . Knee surgery     Family History  Problem Relation Age of Onset  . Diabetes Mother   . Cancer Mother    Social History  Substance Use Topics  . Smoking status: Never Smoker   . Smokeless tobacco: Not on file  . Alcohol Use: No   OB History    No data available     Review of Systems  Respiratory: Negative for shortness of breath.   Cardiovascular: Positive for near-syncope. Negative for chest pain.  All other systems reviewed and are negative.     Allergies  Codeine and Ketorolac tromethamine  Home Medications   Prior to Admission medications   Medication Sig Start Date End  Date Taking? Authorizing Provider  acetaminophen (TYLENOL) 325 MG tablet Take 650 mg by mouth every 6 (six) hours as needed for mild pain or moderate pain.    Historical Provider, MD  albuterol (PROVENTIL HFA;VENTOLIN HFA) 108 (90 BASE) MCG/ACT inhaler Inhale 1-2 puffs into the lungs every 6 (six) hours as needed for wheezing or shortness of breath.    Historical Provider, MD  doxycycline (VIBRAMYCIN) 100 MG capsule Take 1 capsule (100 mg total) by mouth 2 (two) times daily. One po bid x 14 days 03/06/15   Nada Boozer Hess, PA-C  fluticasone (FLOVENT HFA) 110 MCG/ACT inhaler Inhale 1 puff into the lungs 2 (two) times daily. 12/06/14   Linwood Dibbles, MD  ondansetron (ZOFRAN ODT) 8 MG disintegrating tablet Take 1 tablet (8 mg total) by mouth every 8 (eight) hours as needed for nausea or vomiting. Patient not taking: Reported on 12/06/2014 01/28/14   Azalia Bilis, MD  oxyCODONE-acetaminophen (PERCOCET/ROXICET) 5-325 MG per tablet Take 1 tablet by mouth every 4 (four) hours as needed for severe pain. Patient not taking: Reported on 12/06/2014 01/28/14   Azalia Bilis, MD  predniSONE (DELTASONE) 10 MG tablet Take 2 tablets (20 mg total) by mouth daily with breakfast. Patient not taking: Reported on 03/06/2015 12/10/14   Earley Favor, NP   BP 172/94 mmHg  Pulse 72  Temp(Src) 98.2 F (36.8 C) (Oral)  Resp 13  Ht  (1.651 m)  Wt  281 lb (127.461 kg)  BMI 46.76 kg/m2  SpO2 98% Physical Exam  Constitutional: She is oriented to person, place, and time. She appears well-developed and well-nourished. No distress.  HENT:  Head: Normocephalic and atraumatic.  Neck: Normal range of motion. Neck supple.  Cardiovascular: Normal rate and regular rhythm.  Exam reveals no gallop and no friction rub.   No murmur heard. Pulmonary/Chest: Effort normal and breath sounds normal. No respiratory distress. She has no wheezes.  Abdominal: Soft. Bowel sounds are normal. She exhibits no distension. There is no tenderness.   Musculoskeletal: Normal range of motion.  Neurological: She is alert and oriented to person, place, and time. No cranial nerve deficit. She exhibits normal muscle tone. Coordination normal.  Skin: Skin is warm and dry. She is not diaphoretic.  Nursing note and vitals reviewed.   ED Course  Procedures (including critical care time) Labs Review Labs Reviewed  BASIC METABOLIC PANEL  CBC WITH DIFFERENTIAL/PLATELET  CBG MONITORING, ED    Imaging Review No results found. I have personally reviewed and evaluated these images and lab results as part of my medical decision-making.   EKG Interpretation   Date/Time:  Sunday July 01 2015 11:50:44 EST Ventricular Rate:  69 PR Interval:  160 QRS Duration: 124 QT Interval:  437 QTC Calculation: 468 R Axis:   35 Text Interpretation:  Sinus rhythm Normal ECG Confirmed by Dinora Hemm  MD,  Timisha Mondry ([id number]) on 07/01/2015 11:59:18 AM      MDM   Final diagnoses:  None    Patient presents here complaining of near syncope that occurred after finding out that her grandmother had passed away. Her laboratory studies are unremarkable and she appears neurologically intact. I suspect this was a vasovagal grief related reaction and do not feel as though any further workup or admission is indicated. She will be discharged with instructions to return as needed for any problems.    Geoffery Lyonsouglas Aradhya Shellenbarger, MD 07/01/15 [phone]

## 2015-07-01 NOTE — ED Notes (Signed)
Pt was upstairs, her grandmother (who raised her died). Pt had near syncopal episode and knees buckled twice, pt brought down to ED to be evaluated. Pt does not really want to stay, wants to go back upstairs. Pt reports she is seeing spots, but has intermittently been seeing spots x5 months. Hx of HTN, but has not taken medications for 2 years. Denies pain. Alert and oriented x4.

## 2015-07-01 NOTE — ED Notes (Signed)
Bed: WA05 Expected date:  Expected time:  Means of arrival:  Comments: Near Syncope 

## 2015-07-01 NOTE — ED Notes (Signed)
md at bedside

## 2015-07-01 NOTE — Discharge Instructions (Signed)
Return to the emergency department if your symptoms significantly worsen or change. 

## 2016-04-07 ENCOUNTER — Ambulatory Visit (HOSPITAL_COMMUNITY)
Admission: EM | Admit: 2016-04-07 | Discharge: 2016-04-07 | Disposition: A | Discharge status: Home / Self Care | Payer: Medicaid Other | Attending: Family Medicine | Admitting: Family Medicine

## 2016-04-07 ENCOUNTER — Encounter (HOSPITAL_COMMUNITY): Payer: Self-pay | Admitting: Emergency Medicine

## 2016-04-07 DIAGNOSIS — H00016 Hordeolum externum left eye, unspecified eyelid: Secondary | ICD-10-CM | POA: Diagnosis not present

## 2016-04-07 MED ORDER — TOBRAMYCIN 0.3 % OP SOLN: 1.0000 [drp] | Freq: Four times a day (QID) | OPHTHALMIC | 0 refills | Status: DC

## 2016-04-07 MED ORDER — AMLODIPINE BESYLATE 5 MG PO TABS: 5.0000 mg | ORAL_TABLET | Freq: Every day | ORAL | 3 refills | Status: DC

## 2016-04-07 MED ORDER — DOXYCYCLINE HYCLATE 100 MG PO TABS: 100.0000 mg | ORAL_TABLET | Freq: Two times a day (BID) | ORAL | 0 refills | Status: DC

## 2016-04-07 NOTE — ED Provider Notes (Addendum)
[facility]    CSN: [id number] Arrival date & time: 04/07/16  1000  First Provider Contact:  First MD Initiated Contact with Patient 04/07/16 1027        History   Chief Complaint Chief Complaint  Patient presents with  . Eye Problem    HPI Sheri Park is a 50 y.o. female.   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 She had no known insect bite or trauma to the eye. She's had no change in her vision.  When she went to work today she was told to get checked out before she returned.      Past Medical History:  Diagnosis Date  . Asthma   . Hypertension     There are no active problems to display for this patient.   Past Surgical History:  Procedure Laterality Date  . KNEE SURGERY    . TUBAL LIGATION      OB History    No data available       Home Medications    Prior to Admission medications   Medication Sig Start Date End Date Taking? Authorizing Provider  acetaminophen (TYLENOL) 325 MG tablet Take 650 mg by mouth every 6 (six) hours as needed for mild pain or moderate pain.    Historical Provider, MD  albuterol (PROVENTIL HFA;VENTOLIN HFA) 108 (90 BASE) MCG/ACT inhaler Inhale 1-2 puffs into the lungs every 6 (six) hours as needed for wheezing or shortness of breath.    Historical Provider, MD  amLODipine (NORVASC) 5 MG tablet Take 1 tablet (5 mg total) by mouth daily. 04/07/16   Elvina SidleKurt Blakleigh Straw, MD  DM-Doxylamine-Acetaminophen (NYQUIL COLD & FLU PO) Take 5 mLs by mouth at bedtime as needed (cold symptoms).    Historical Provider, MD  doxycycline (VIBRA-TABS) 100 MG tablet Take 1 tablet (100 mg total) by mouth 2 (two) times daily. 04/07/16   Elvina SidleKurt Achol Azpeitia, MD  fluticasone (FLOVENT HFA) 110 MCG/ACT inhaler Inhale 1 puff into the lungs 2 (two) times daily. 12/06/14   Linwood DibblesJon Knapp, MD  tobramycin (TOBREX) 0.3 % ophthalmic solution Place 1 drop into the left eye every 6 (six) hours. 04/07/16   Elvina SidleKurt  Ruba Outen, MD    Family History Family History  Problem Relation Age of Onset  . Diabetes Mother   . Cancer Mother     Social History Social History  Substance Use Topics  . Smoking status: Never Smoker  . Smokeless tobacco: Never Used  . Alcohol use No     Allergies   Peanut-containing drug products; Codeine; and Ketorolac tromethamine   Review of Systems Review of Systems  Constitutional: Negative.   HENT: Negative.   Eyes: Positive for pain and redness. Negative for photophobia, discharge, itching and visual disturbance.  Respiratory: Negative.   Cardiovascular: Negative.      Physical Exam Triage Vital Signs ED Triage Vitals  Enc Vitals Group     BP      Pulse      Resp      Temp      Temp src      SpO2      Weight      Height      Head Circumference      Peak Flow      Pain Score      Pain Loc      Pain Edu?      Excl. in GC?    No data found.  Updated Vital Signs LMP 03/14/2016   Visual Acuity Right Eye Distance:   Left Eye Distance:   Bilateral Distance:    Right Eye Near:   Left Eye Near:    Bilateral Near:     Physical Exam  Constitutional: She appears well-developed and well-nourished.  HENT:  Head: Normocephalic and atraumatic.  Right Ear: External ear normal.  Left Ear: External ear normal.  Mouth/Throat: Oropharynx is clear and moist.  Eyes: Conjunctivae and EOM are normal. Pupils are equal, round, and reactive to light.  Swollen left upper eyelid which is tender to palpation. There is no fluctuance.  Neck: Normal range of motion. Neck supple.  Pulmonary/Chest: Effort normal.  Nursing note and vitals reviewed.    UC Treatments / Results  Labs (all labs ordered are listed, but only abnormal results are displayed) Labs Reviewed - No data to display  EKG  EKG Interpretation None       Radiology No results found.  Procedures Procedures (including critical care time)  Medications Ordered in UC Medications -  No data to display   Initial Impression / Assessment and Plan / UC Course  I have reviewed the triage vital signs and the nursing notes.  Pertinent labs & imaging results that were available during my care of the patient were reviewed by me and considered in my medical decision making (see chart for details).  Clinical Course      Final Clinical Impressions(s) / UC Diagnoses   Final diagnoses:  Stye external, left    New Prescriptions New Prescriptions   AMLODIPINE (NORVASC) 5 MG TABLET    Take 1 tablet (5 mg total) by mouth daily.   DOXYCYCLINE (VIBRA-TABS) 100 MG TABLET    Take 1 tablet (100 mg total) by mouth 2 (two) times daily.   TOBRAMYCIN (TOBREX) 0.3 % OPHTHALMIC SOLUTION    Place 1 drop into the left eye every 6 (six) hours.     [name], MD 04/07/16 1035    [name], MD 04/07/16 1041

## 2016-04-07 NOTE — ED Triage Notes (Signed)
Concerns for left eye pain and swelling.  Patient concerned for a spider bite.

## 2016-10-16 ENCOUNTER — Encounter (HOSPITAL_COMMUNITY): Payer: Self-pay

## 2016-10-17 ENCOUNTER — Observation Stay (HOSPITAL_COMMUNITY): Payer: Medicaid Other

## 2016-10-17 ENCOUNTER — Inpatient Hospital Stay (HOSPITAL_COMMUNITY)
Admission: AD | Admit: 2016-10-17 | Discharge: 2016-10-21 | DRG: 287 | Disposition: A | Discharge status: Home / Self Care | Payer: Medicaid Other | Source: Ambulatory Visit | Attending: Cardiovascular Disease | Admitting: Cardiovascular Disease

## 2016-10-17 DIAGNOSIS — J45909 Unspecified asthma, uncomplicated: Secondary | ICD-10-CM | POA: Diagnosis present

## 2016-10-17 DIAGNOSIS — R079 Chest pain, unspecified: Secondary | ICD-10-CM

## 2016-10-17 DIAGNOSIS — R531 Weakness: Secondary | ICD-10-CM | POA: Diagnosis present

## 2016-10-17 DIAGNOSIS — R0789 Other chest pain: Principal | ICD-10-CM | POA: Diagnosis present

## 2016-10-17 DIAGNOSIS — Z9889 Other specified postprocedural states: Secondary | ICD-10-CM

## 2016-10-17 DIAGNOSIS — Z809 Family history of malignant neoplasm, unspecified: Secondary | ICD-10-CM

## 2016-10-17 DIAGNOSIS — R200 Anesthesia of skin: Secondary | ICD-10-CM | POA: Diagnosis present

## 2016-10-17 DIAGNOSIS — Z833 Family history of diabetes mellitus: Secondary | ICD-10-CM

## 2016-10-17 DIAGNOSIS — E669 Obesity, unspecified: Secondary | ICD-10-CM | POA: Diagnosis present

## 2016-10-17 DIAGNOSIS — Z7982 Long term (current) use of aspirin: Secondary | ICD-10-CM

## 2016-10-17 DIAGNOSIS — Z888 Allergy status to other drugs, medicaments and biological substances status: Secondary | ICD-10-CM

## 2016-10-17 DIAGNOSIS — I10 Essential (primary) hypertension: Secondary | ICD-10-CM | POA: Diagnosis present

## 2016-10-17 DIAGNOSIS — Z885 Allergy status to narcotic agent status: Secondary | ICD-10-CM

## 2016-10-17 DIAGNOSIS — D509 Iron deficiency anemia, unspecified: Secondary | ICD-10-CM | POA: Diagnosis present

## 2016-10-17 DIAGNOSIS — Z9851 Tubal ligation status: Secondary | ICD-10-CM

## 2016-10-17 DIAGNOSIS — M545 Low back pain: Secondary | ICD-10-CM | POA: Diagnosis present

## 2016-10-17 DIAGNOSIS — I249 Acute ischemic heart disease, unspecified: Secondary | ICD-10-CM | POA: Diagnosis present

## 2016-10-17 DIAGNOSIS — Z79899 Other long term (current) drug therapy: Secondary | ICD-10-CM

## 2016-10-17 DIAGNOSIS — Z7951 Long term (current) use of inhaled steroids: Secondary | ICD-10-CM

## 2016-10-17 DIAGNOSIS — Z6841 Body Mass Index (BMI) 40.0 and over, adult: Secondary | ICD-10-CM

## 2016-10-17 LAB — RETICULOCYTES
RBC.: 4.38 MIL/uL (ref 3.87–5.11)
RETIC CT PCT: 1.4 % (ref 0.4–3.1)
Retic Count, Absolute: 61.3 10*3/uL (ref 19.0–186.0)

## 2016-10-17 LAB — CBC WITH DIFFERENTIAL/PLATELET
BASOS ABS: 0.1 10*3/uL (ref 0.0–0.1)
Basophils Relative: 1 %
Eosinophils Absolute: 0.1 10*3/uL (ref 0.0–0.7)
Eosinophils Relative: 1 %
HEMATOCRIT: 30.1 % — AB (ref 36.0–46.0)
Hemoglobin: 9.1 g/dL — ABNORMAL LOW (ref 12.0–15.0)
LYMPHS ABS: 2.3 10*3/uL (ref 0.7–4.0)
Lymphocytes Relative: 37 %
MCH: 20.9 pg — ABNORMAL LOW (ref 26.0–34.0)
MCHC: 30.2 g/dL (ref 30.0–36.0)
MCV: 69 fL — ABNORMAL LOW (ref 78.0–100.0)
MONO ABS: 0.5 10*3/uL (ref 0.1–1.0)
MONOS PCT: 9 %
NEUTROS PCT: 52 %
Neutro Abs: 3.1 10*3/uL (ref 1.7–7.7)
PLATELETS: 278 10*3/uL (ref 150–400)
RBC: 4.36 MIL/uL (ref 3.87–5.11)
RDW: 17.1 % — AB (ref 11.5–15.5)
WBC: 6.1 10*3/uL (ref 4.0–10.5)

## 2016-10-17 LAB — VITAMIN B12: VITAMIN B 12: 264 pg/mL (ref 180–914)

## 2016-10-17 LAB — IRON AND TIBC
Iron: 31 ug/dL (ref 28–170)
Saturation Ratios: 7 % — ABNORMAL LOW (ref 10.4–31.8)
TIBC: 469 ug/dL — ABNORMAL HIGH (ref 250–450)
UIBC: 438 ug/dL

## 2016-10-17 LAB — COMPREHENSIVE METABOLIC PANEL
ALBUMIN: 3.4 g/dL — AB (ref 3.5–5.0)
ALT: 17 U/L (ref 14–54)
AST: 22 U/L (ref 15–41)
Alkaline Phosphatase: 54 U/L (ref 38–126)
Anion gap: 8 (ref 5–15)
BILIRUBIN TOTAL: 0.5 mg/dL (ref 0.3–1.2)
BUN: 8 mg/dL (ref 6–20)
CHLORIDE: 106 mmol/L (ref 101–111)
CO2: 26 mmol/L (ref 22–32)
Calcium: 9.3 mg/dL (ref 8.9–10.3)
Creatinine, Ser: 0.97 mg/dL (ref 0.44–1.00)
GFR calc Af Amer: >60 mL/min (ref >60)
GFR calc non Af Amer: >60 mL/min (ref >60)
GLUCOSE: 91 mg/dL (ref 65–99)
POTASSIUM: 4.1 mmol/L (ref 3.5–5.1)
Sodium: 140 mmol/L (ref 135–145)
TOTAL PROTEIN: 7.3 g/dL (ref 6.5–8.1)

## 2016-10-17 LAB — TROPONIN I
Troponin I: <0.03 ng/mL (ref <0.03)
Troponin I: <0.03 ng/mL (ref <0.03)
Troponin I: <0.03 ng/mL (ref <0.03)

## 2016-10-17 LAB — PROTIME-INR
INR: 0.95
Prothrombin Time: 12.7 seconds (ref 11.4–15.2)

## 2016-10-17 LAB — FERRITIN: Ferritin: 4 ng/mL — ABNORMAL LOW (ref 11–307)

## 2016-10-17 LAB — FOLATE: Folate: 19.5 ng/mL (ref >5.9)

## 2016-10-17 LAB — HEPARIN LEVEL (UNFRACTIONATED): HEPARIN UNFRACTIONATED: 0.32 [IU]/mL (ref 0.30–0.70)

## 2016-10-17 LAB — TSH: TSH: 1.146 u[IU]/mL (ref 0.350–4.500)

## 2016-10-17 MED ORDER — REGADENOSON 0.4 MG/5ML IV SOLN
INTRAVENOUS | Status: AC
  Filled 2016-10-17: qty 5

## 2016-10-17 MED ORDER — NITROGLYCERIN 0.4 MG SL SUBL
0.4000 mg | SUBLINGUAL_TABLET | SUBLINGUAL | Status: DC | PRN
  Administered 2016-10-18 – 2016-10-19 (×3): 0.4 mg via SUBLINGUAL
  Filled 2016-10-17 (×4): qty 1

## 2016-10-17 MED ORDER — HEPARIN (PORCINE) IN NACL 100-0.45 UNIT/ML-% IJ SOLN
1350.0000 [IU]/h | INTRAMUSCULAR | Status: DC
  Administered 2016-10-17: 1250 [IU]/h via INTRAVENOUS
  Administered 2016-10-18 (×2): 1350 [IU]/h via INTRAVENOUS
  Filled 2016-10-17 (×4): qty 250

## 2016-10-17 MED ORDER — HYDROCHLOROTHIAZIDE 25 MG PO TABS
12.5000 mg | ORAL_TABLET | Freq: Every day | ORAL | Status: DC
  Administered 2016-10-17 – 2016-10-21 (×5): 12.5 mg via ORAL
  Filled 2016-10-17 (×5): qty 1

## 2016-10-17 MED ORDER — PNEUMOCOCCAL VAC POLYVALENT 25 MCG/0.5ML IJ INJ
0.5000 mL | INJECTION | INTRAMUSCULAR | Status: AC
  Administered 2016-10-19: 0.5 mL via INTRAMUSCULAR
  Filled 2016-10-17 (×2): qty 0.5

## 2016-10-17 MED ORDER — ATORVASTATIN CALCIUM 40 MG PO TABS
40.0000 mg | ORAL_TABLET | Freq: Every day | ORAL | Status: DC
  Administered 2016-10-17 – 2016-10-20 (×4): 40 mg via ORAL
  Filled 2016-10-17 (×4): qty 1

## 2016-10-17 MED ORDER — SODIUM CHLORIDE 0.9 % IV SOLN: 250.0000 mL | INTRAVENOUS | Status: DC | PRN

## 2016-10-17 MED ORDER — AMLODIPINE BESYLATE 5 MG PO TABS
5.0000 mg | ORAL_TABLET | Freq: Every day | ORAL | Status: DC
  Administered 2016-10-17 – 2016-10-21 (×5): 5 mg via ORAL
  Filled 2016-10-17 (×5): qty 1

## 2016-10-17 MED ORDER — ALBUTEROL SULFATE (2.5 MG/3ML) 0.083% IN NEBU: 2.5000 mg | INHALATION_SOLUTION | Freq: Four times a day (QID) | RESPIRATORY_TRACT | Status: DC | PRN

## 2016-10-17 MED ORDER — POTASSIUM CHLORIDE CRYS ER 10 MEQ PO TBCR
10.0000 meq | EXTENDED_RELEASE_TABLET | Freq: Every day | ORAL | Status: DC
  Administered 2016-10-17 – 2016-10-21 (×5): 10 meq via ORAL
  Filled 2016-10-17 (×5): qty 1

## 2016-10-17 MED ORDER — ACETAMINOPHEN 325 MG PO TABS
650.0000 mg | ORAL_TABLET | ORAL | Status: DC | PRN
  Administered 2016-10-17 – 2016-10-21 (×9): 650 mg via ORAL
  Filled 2016-10-17 (×9): qty 2

## 2016-10-17 MED ORDER — SODIUM CHLORIDE 0.9% FLUSH: 3.0000 mL | INTRAVENOUS | Status: DC | PRN

## 2016-10-17 MED ORDER — ONDANSETRON HCL 4 MG/2ML IJ SOLN: 4.0000 mg | Freq: Four times a day (QID) | INTRAMUSCULAR | Status: DC | PRN

## 2016-10-17 MED ORDER — REGADENOSON 0.4 MG/5ML IV SOLN
0.4000 mg | Freq: Once | INTRAVENOUS | Status: AC
  Administered 2016-10-17: 0.4 mg via INTRAVENOUS
  Filled 2016-10-17: qty 5

## 2016-10-17 MED ORDER — SODIUM CHLORIDE 0.9% FLUSH
3.0000 mL | Freq: Two times a day (BID) | INTRAVENOUS | Status: DC
  Administered 2016-10-17 – 2016-10-20 (×5): 3 mL via INTRAVENOUS

## 2016-10-17 MED ORDER — HEPARIN BOLUS VIA INFUSION
4000.0000 [IU] | Freq: Once | INTRAVENOUS | Status: AC
  Administered 2016-10-17: 4000 [IU] via INTRAVENOUS
  Filled 2016-10-17: qty 4000

## 2016-10-17 MED ORDER — METOPROLOL TARTRATE 12.5 MG HALF TABLET
12.5000 mg | ORAL_TABLET | Freq: Two times a day (BID) | ORAL | Status: DC
  Administered 2016-10-17 – 2016-10-21 (×9): 12.5 mg via ORAL
  Filled 2016-10-17 (×9): qty 1

## 2016-10-17 MED ORDER — TECHNETIUM TC 99M TETROFOSMIN IV KIT
30.0000 | PACK | Freq: Once | INTRAVENOUS | Status: AC | PRN
  Administered 2016-10-17: 30 via INTRAVENOUS

## 2016-10-17 MED ORDER — ASPIRIN EC 81 MG PO TBEC
81.0000 mg | DELAYED_RELEASE_TABLET | Freq: Every day | ORAL | Status: DC
  Administered 2016-10-17 – 2016-10-19 (×3): 81 mg via ORAL
  Filled 2016-10-17 (×3): qty 1

## 2016-10-17 NOTE — H&P (Signed)
Referring Physician:  [name] is an 51 y.o. female.                       Chief Complaint: Chest pain  HPI: 51 year old female with uncontrolled hypertension and obesity has recurrent chest pain like pressure under the breast and stomach area with exertional dyspnea. Patients blames her symptoms on stress. No fever or cough. She also has h/o heavy periods and chronic anemia.  Past Medical History:  Diagnosis Date  . Asthma   . Hypertension       Past Surgical History:  Procedure Laterality Date  . KNEE SURGERY    . TUBAL LIGATION      Family History  Problem Relation Age of Onset  . Diabetes Mother   . Cancer Mother    Social History:  reports that she has never smoked. She has never used smokeless tobacco. She reports that she does not drink alcohol or use drugs.  Allergies:  Allergies  Allergen Reactions  . Peanut-Containing Drug Products Anaphylaxis    Oil, nuts, anything peanut related  . Codeine Itching  . Ketorolac Tromethamine Swelling    Medications Prior to Admission  Medication Sig Dispense Refill  . acetaminophen (TYLENOL) 325 MG tablet Take 650 mg by mouth every 6 (six) hours as needed for mild pain or moderate pain.    Marland Kitchen albuterol (PROVENTIL HFA;VENTOLIN HFA) 108 (90 BASE) MCG/ACT inhaler Inhale 1-2 puffs into the lungs every 6 (six) hours as needed for wheezing or shortness of breath.    Marland Kitchen amLODipine (NORVASC) 5 MG tablet Take 1 tablet (5 mg total) by mouth daily. 90 tablet 3  . aspirin EC 81 MG tablet Take 81 mg by mouth daily.    . fluticasone (FLOVENT HFA) 110 MCG/ACT inhaler Inhale 1 puff into the lungs 2 (two) times daily. 1 Inhaler 12  . hydrochlorothiazide (HYDRODIURIL) 12.5 MG tablet Take 12.5 mg by mouth daily.    . metoprolol succinate (TOPROL-XL) 25 MG 24 hr tablet Take 25 mg by mouth daily.    . potassium chloride (K-DUR) 10 MEQ tablet Take 10 mEq by mouth daily.    Marland Kitchen DM-Doxylamine-Acetaminophen (NYQUIL COLD & FLU PO) Take 5 mLs by  mouth at bedtime as needed (cold symptoms).    Marland Kitchen doxycycline (VIBRA-TABS) 100 MG tablet Take 1 tablet (100 mg total) by mouth 2 (two) times daily. 20 tablet 0    Results for orders placed or performed during the hospital encounter of 10/17/16 (from the past 48 hour(s))  Comprehensive metabolic panel     Status: Abnormal   Collection Time: 10/17/16 11:27 AM  Result Value Ref Range   Sodium 140 135 - 145 mmol/L   Potassium 4.1 3.5 - 5.1 mmol/L   Chloride 106 101 - 111 mmol/L   CO2 26 22 - 32 mmol/L   Glucose, Bld 91 65 - 99 mg/dL   BUN 8 6 - 20 mg/dL   Creatinine, Ser 0.97 0.44 - 1.00 mg/dL   Calcium 9.3 8.9 - 10.3 mg/dL   Total Protein 7.3 6.5 - 8.1 g/dL   Albumin 3.4 (L) 3.5 - 5.0 g/dL   AST 22 15 - 41 U/L   ALT 17 14 - 54 U/L   Alkaline Phosphatase 54 38 - 126 U/L   Total Bilirubin 0.5 0.3 - 1.2 mg/dL   GFR calc non Af Amer >60 >60 mL/min   GFR calc Af Amer >60 >60 mL/min    Comment: (  NOTE) The eGFR has been calculated using the CKD EPI equation. This calculation has not been validated in all clinical situations. eGFR's persistently <60 mL/min signify possible Chronic Kidney Disease.    Anion gap 8 5 - 15  Troponin I     Status: None   Collection Time: 10/17/16 11:27 AM  Result Value Ref Range   Troponin I <0.03 <0.03 ng/mL  CBC WITH DIFFERENTIAL     Status: Abnormal (Preliminary result)   Collection Time: 10/17/16 11:27 AM  Result Value Ref Range   WBC 6.1 4.0 - 10.5 K/uL   RBC 4.36 3.87 - 5.11 MIL/uL   Hemoglobin 9.1 (L) 12.0 - 15.0 g/dL   HCT 30.1 (L) 36.0 - 46.0 %   MCV 69.0 (L) 78.0 - 100.0 fL   MCH 20.9 (L) 26.0 - 34.0 pg   MCHC 30.2 30.0 - 36.0 g/dL   RDW 17.1 (H) 11.5 - 15.5 %   Platelets 278 150 - 400 K/uL   Neutrophils Relative % PENDING %   Neutro Abs PENDING 1.7 - 7.7 K/uL   Band Neutrophils PENDING %   Lymphocytes Relative PENDING %   Lymphs Abs PENDING 0.7 - 4.0 K/uL   Monocytes Relative PENDING %   Monocytes Absolute PENDING 0.1 - 1.0 K/uL    Eosinophils Relative PENDING %   Eosinophils Absolute PENDING 0.0 - 0.7 K/uL   Basophils Relative PENDING %   Basophils Absolute PENDING 0.0 - 0.1 K/uL   WBC Morphology PENDING    RBC Morphology PENDING    Smear Review PENDING    nRBC PENDING 0 /100 WBC   Metamyelocytes Relative PENDING %   Myelocytes PENDING %   Promyelocytes Absolute PENDING %   Blasts PENDING %  Protime-INR     Status: None   Collection Time: 10/17/16 11:27 AM  Result Value Ref Range   Prothrombin Time 12.7 11.4 - 15.2 seconds   INR 0.95    No results found.  Review Of Systems Constitutional: No fever, chills, weight loss or gain. Eyes: No vision change, wears glasses. No discharge or pain. Ears: No hearing loss, No tinnitus. Respiratory: NPositive asthma, no COPD, pneumonias. Positive shortness of breath. No hemoptysis. Cardiovascular: Positive chest pain,no  palpitation, leg edema. Gastrointestinal: No nausea, vomiting, diarrhea, constipation. No GI bleed. No hepatitis. Genitourinary: No dysuria, hematuria, kidney stone. No incontinance. H/O heavy monthly periods. Neurological: No headache, stroke, seizures.  Psychiatry: No psych facility admission for anxiety, depression, suicide. No detox. Skin: No rash. Musculoskeletal: No joint pain, fibromyalgia. No neck pain, back pain. Lymphadenopathy: No lymphadenopathy. Hematology: Chronic anemia. No easy bruising.   Blood pressure (!) 162/109, temperature 97.5 F (36.4 C), temperature source Oral, resp. rate 18, height 5' 6" (1.676 m), weight (!) 137 kg (302 lb), SpO2 100 %. Body mass index is 48.74 kg/m. General appearance: alert, cooperative, appears stated age and no distress. Well built and overnourished. Head: Normocephalic, atraumatic. Eyes: Brown eyes, pale pink conjunctiva, corneas clear. PERRL, EOM's intact. Neck: No adenopathy, no carotid bruit, no JVD, supple, symmetrical, trachea midline and thyroid not enlarged. Resp: Clear to auscultation  bilaterally. Cardio: Regular rate and rhythm, S1, S2 normal, II/VI systolic murmur, no click, rub or gallop GI: Soft, non-tender; bowel sounds normal; no organomegaly. Extremities: 1 + edema, cyanosis or clubbing. Skin: Warm and dry.  Neurologic: Alert and oriented X 3, normal strength. Normal coordination and gait.  Assessment/Plan Chest pain r/o MI Exertional dyspnea Hypertesion Obesity Anemia, r/o iron deficiency  Nuclear stress test/Blood  work.  Birdie Riddle, MD  10/17/2016, 12:43 PM

## 2016-10-17 NOTE — Progress Notes (Signed)
ANTICOAGULATION CONSULT NOTE - Initial Consult  Pharmacy Consult for heparin Indication: chest pain/ACS  Allergies  Allergen Reactions  . Peanut-Containing Drug Products Anaphylaxis    Oil, nuts, anything peanut related  . Codeine Itching  . Ketorolac Tromethamine Swelling    Patient Measurements: Height:   5'6" Heparin Dosing Weight: 93 kg   Vital Signs: Temp: 97.5 F (36.4 C) (04/06 1109) Temp Source: Oral (04/06 1109) BP: 162/109 (04/06 1109)   Labs: No results for input(s): HGB, HCT, PLT, APTT, LABPROT, INR, HEPARINUNFRC, HEPRLOWMOCWT, CREATININE, CKTOTAL, CKMB, TROPONINI in the last 72 hours.   Medical History: Past Medical History:  Diagnosis Date  . Asthma   . 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

## 2016-10-17 NOTE — Progress Notes (Signed)
ANTICOAGULATION CONSULT NOTE - Initial Consult  Pharmacy Consult for heparin Indication: chest pain/ACS  Allergies  Allergen Reactions  . Peanut-Containing Drug Products Anaphylaxis    Oil, nuts, anything peanut related  . Codeine Itching  . Ketorolac Tromethamine Swelling    Patient Measurements: Height:   5'6" Weight: 137 kg IBW: 59.3 kg Heparin Dosing Weight: 93 kg   Vital Signs: Temp: 97.9 F (36.6 C) (04/06 1944) Temp Source: Oral (04/06 1944) BP: 131/75 (04/06 2155) Pulse Rate: 68 (04/06 1944)   Labs:  Recent Labs  10/17/16 1127 10/17/16 1704 10/17/16 2046 10/17/16 2056  HGB 9.1*  --   --   --   HCT 30.1*  --   --   --   PLT 278  --   --   --   LABPROT 12.7  --   --   --   INR 0.95  --   --   --   HEPARINUNFRC  --   --   --  0.32  CREATININE 0.97  --   --   --   TROPONINI <0.03 <0.03 <0.03  --      Medical History: Past Medical History:  Diagnosis Date  . Asthma   . 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  Will increase rate slightly in hopes to maintain therapeutic goal.  Goal of Therapy:  Heparin level 0.3-0.7 units/ml Monitor platelets by anticoagulation protocol: Yes  Plan:  Increase Heparin to 1350 units/hr Confirmation heparin level with AM labs Daily heparin level, CBC  Marvon Shillingburg, Pharm.D., BCPS Clinical Pharmacist Pager [phone] 10/17/2016 10:12 PM

## 2016-10-18 ENCOUNTER — Encounter (HOSPITAL_COMMUNITY): Payer: Self-pay | Admitting: *Deleted

## 2016-10-18 ENCOUNTER — Observation Stay (HOSPITAL_COMMUNITY): Payer: Medicaid Other

## 2016-10-18 DIAGNOSIS — I249 Acute ischemic heart disease, unspecified: Secondary | ICD-10-CM | POA: Diagnosis present

## 2016-10-18 DIAGNOSIS — Z809 Family history of malignant neoplasm, unspecified: Secondary | ICD-10-CM | POA: Diagnosis not present

## 2016-10-18 DIAGNOSIS — Z888 Allergy status to other drugs, medicaments and biological substances status: Secondary | ICD-10-CM | POA: Diagnosis not present

## 2016-10-18 DIAGNOSIS — Z6841 Body Mass Index (BMI) 40.0 and over, adult: Secondary | ICD-10-CM | POA: Diagnosis not present

## 2016-10-18 DIAGNOSIS — Z7982 Long term (current) use of aspirin: Secondary | ICD-10-CM | POA: Diagnosis not present

## 2016-10-18 DIAGNOSIS — E669 Obesity, unspecified: Secondary | ICD-10-CM | POA: Diagnosis present

## 2016-10-18 DIAGNOSIS — Z7951 Long term (current) use of inhaled steroids: Secondary | ICD-10-CM | POA: Diagnosis not present

## 2016-10-18 DIAGNOSIS — M545 Low back pain: Secondary | ICD-10-CM | POA: Diagnosis present

## 2016-10-18 DIAGNOSIS — D509 Iron deficiency anemia, unspecified: Secondary | ICD-10-CM | POA: Diagnosis present

## 2016-10-18 DIAGNOSIS — R531 Weakness: Secondary | ICD-10-CM | POA: Diagnosis present

## 2016-10-18 DIAGNOSIS — R200 Anesthesia of skin: Secondary | ICD-10-CM | POA: Diagnosis present

## 2016-10-18 DIAGNOSIS — J45909 Unspecified asthma, uncomplicated: Secondary | ICD-10-CM | POA: Diagnosis present

## 2016-10-18 DIAGNOSIS — I10 Essential (primary) hypertension: Secondary | ICD-10-CM | POA: Diagnosis present

## 2016-10-18 DIAGNOSIS — Z9851 Tubal ligation status: Secondary | ICD-10-CM | POA: Diagnosis not present

## 2016-10-18 DIAGNOSIS — Z833 Family history of diabetes mellitus: Secondary | ICD-10-CM | POA: Diagnosis not present

## 2016-10-18 DIAGNOSIS — R0789 Other chest pain: Secondary | ICD-10-CM | POA: Diagnosis present

## 2016-10-18 DIAGNOSIS — Z79899 Other long term (current) drug therapy: Secondary | ICD-10-CM | POA: Diagnosis not present

## 2016-10-18 DIAGNOSIS — Z885 Allergy status to narcotic agent status: Secondary | ICD-10-CM | POA: Diagnosis not present

## 2016-10-18 DIAGNOSIS — Z9889 Other specified postprocedural states: Secondary | ICD-10-CM | POA: Diagnosis not present

## 2016-10-18 LAB — LIPID PANEL
Cholesterol: 164 mg/dL (ref 0–200)
HDL: 44 mg/dL (ref >40)
LDL Cholesterol: 105 mg/dL — ABNORMAL HIGH (ref 0–99)
TRIGLYCERIDES: 75 mg/dL (ref <150)
Total CHOL/HDL Ratio: 3.7 RATIO
VLDL: 15 mg/dL (ref 0–40)

## 2016-10-18 LAB — BASIC METABOLIC PANEL
Anion gap: 11 (ref 5–15)
BUN: 10 mg/dL (ref 6–20)
CHLORIDE: 101 mmol/L (ref 101–111)
CO2: 25 mmol/L (ref 22–32)
CREATININE: 0.9 mg/dL (ref 0.44–1.00)
Calcium: 8.9 mg/dL (ref 8.9–10.3)
Glucose, Bld: 103 mg/dL — ABNORMAL HIGH (ref 65–99)
Potassium: 3.7 mmol/L (ref 3.5–5.1)
SODIUM: 137 mmol/L (ref 135–145)

## 2016-10-18 LAB — CBC
HEMATOCRIT: 29.3 % — AB (ref 36.0–46.0)
Hemoglobin: 8.8 g/dL — ABNORMAL LOW (ref 12.0–15.0)
MCH: 20.8 pg — AB (ref 26.0–34.0)
MCHC: 30 g/dL (ref 30.0–36.0)
MCV: 69.1 fL — AB (ref 78.0–100.0)
Platelets: 297 10*3/uL (ref 150–400)
RBC: 4.24 MIL/uL (ref 3.87–5.11)
RDW: 17.1 % — AB (ref 11.5–15.5)
WBC: 6.1 10*3/uL (ref 4.0–10.5)

## 2016-10-18 LAB — HEPARIN LEVEL (UNFRACTIONATED): Heparin Unfractionated: 0.37 IU/mL (ref 0.30–0.70)

## 2016-10-18 LAB — HIV ANTIBODY (ROUTINE TESTING W REFLEX): HIV Screen 4th Generation wRfx: NONREACTIVE

## 2016-10-18 MED ORDER — VITAMIN C 500 MG PO TABS
500.0000 mg | ORAL_TABLET | Freq: Every day | ORAL | Status: DC
  Administered 2016-10-18 – 2016-10-20 (×3): 500 mg via ORAL
  Filled 2016-10-18 (×3): qty 1

## 2016-10-18 MED ORDER — TECHNETIUM TC 99M TETROFOSMIN IV KIT
30.0000 | PACK | Freq: Once | INTRAVENOUS | Status: AC | PRN
  Administered 2016-10-18: 30 via INTRAVENOUS

## 2016-10-18 MED ORDER — VITAMIN C 500 MG PO TABS: 250.0000 mg | ORAL_TABLET | Freq: Two times a day (BID) | ORAL | Status: DC

## 2016-10-18 MED ORDER — FERROUS SULFATE 325 (65 FE) MG PO TABS
325.0000 mg | ORAL_TABLET | Freq: Two times a day (BID) | ORAL | Status: DC
  Administered 2016-10-18 – 2016-10-21 (×6): 325 mg via ORAL
  Filled 2016-10-18 (×6): qty 1

## 2016-10-18 MED ORDER — GABAPENTIN 100 MG PO CAPS
200.0000 mg | ORAL_CAPSULE | Freq: Once | ORAL | Status: AC
  Administered 2016-10-18: 200 mg via ORAL
  Filled 2016-10-18: qty 2

## 2016-10-18 NOTE — Progress Notes (Signed)
Pt complained of cp. Vitals stable. Relieved by 1 dose of nitro. Pt is now sleeping in bed. Will continue to monitor pt closely.

## 2016-10-18 NOTE — Progress Notes (Signed)
Ref: No PCP Per Patient   Subjective:  Nuclear stress test positive for ischemia including chest pain and EKG changes of ischemia.  Objective:  Vital Signs in the last 24 hours: Temp:  [97.7 F (36.5 C)-98.5 F (36.9 C)] 98.4 F (36.9 C) (04/07 1528) Pulse Rate:  [66-70] 69 (04/07 1528) Cardiac Rhythm: Normal sinus rhythm (04/07 1300) Resp:  [17-19] 19 (04/07 1528) BP: (130-145)/(62-84) 132/72 (04/07 1528) SpO2:  [99 %-100 %] 100 % (04/07 1528) Weight:  [142.2 kg (313 lb 8 oz)] 142.2 kg (313 lb 8 oz) (04/07 0500)  Physical Exam: BP Readings from Last 1 Encounters:  10/18/16 132/72    Wt Readings from Last 1 Encounters:  10/18/16 (!) 142.2 kg (313 lb 8 oz)    Weight change:  Body mass index is 50.6 kg/m. HEENT: [place]/AT, Eyes-Brown, PERL, EOMI, Conjunctiva-Pale pink, Sclera-Non-icteric Neck: No JVD, No bruit, Trachea midline. Lungs:  Clear, Bilateral. Cardiac:  Regular rhythm, normal S1 and S2, no S3. II/VI systolic murmur. Abdomen:  Soft, non-tender. BS present. Extremities:  No edema present. No cyanosis. No clubbing. CNS: AxOx3, Cranial nerves grossly intact, moves all 4 extremities.  Skin: Warm and dry.   Intake/Output from previous day: 04/06 0701 - 04/07 0700 In: 484.3 [P.O.:240; I.V.:244.3] Out: -     Lab Results: BMET    Component Value Date/Time   NA 137 10/18/2016 0403   NA 140 10/17/2016 1127   NA 142 07/01/2015 1226   K 3.7 10/18/2016 0403   K 4.1 10/17/2016 1127   K 3.3 (L) 07/01/2015 1226   CL 101 10/18/2016 0403   CL 106 10/17/2016 1127   CL 106 07/01/2015 1226   CO2 25 10/18/2016 0403   CO2 26 10/17/2016 1127   CO2 26 07/01/2015 1226   GLUCOSE 103 (H) 10/18/2016 0403   GLUCOSE 91 10/17/2016 1127   GLUCOSE 92 07/01/2015 1226   BUN 10 10/18/2016 0403   BUN 8 10/17/2016 1127   BUN 8 07/01/2015 1226   CREATININE 0.90 10/18/2016 0403   CREATININE 0.97 10/17/2016 1127   CREATININE 0.89 07/01/2015 1226   CALCIUM 8.9 10/18/2016 0403   CALCIUM  9.3 10/17/2016 1127   CALCIUM 8.9 07/01/2015 1226   GFRNONAA >60 10/18/2016 0403   GFRNONAA >60 10/17/2016 1127   GFRNONAA >60 07/01/2015 1226   GFRAA >60 10/18/2016 0403   GFRAA >60 10/17/2016 1127   GFRAA >60 07/01/2015 1226   CBC    Component Value Date/Time   WBC 6.1 10/18/2016 0403   RBC 4.24 10/18/2016 0403   HGB 8.8 (L) 10/18/2016 0403   HCT 29.3 (L) 10/18/2016 0403   PLT 297 10/18/2016 0403   MCV 69.1 (L) 10/18/2016 0403   MCH 20.8 (L) 10/18/2016 0403   MCHC 30.0 10/18/2016 0403   RDW 17.1 (H) 10/18/2016 0403   LYMPHSABS 2.3 10/17/2016 1127   MONOABS 0.5 10/17/2016 1127   EOSABS 0.1 10/17/2016 1127   BASOSABS 0.1 10/17/2016 1127   HEPATIC Function Panel  Recent Labs  10/17/16 1127  PROT 7.3   HEMOGLOBIN A1C No components found for: HGA1C,  MPG CARDIAC ENZYMES Lab Results  Component Value Date   TROPONINI <0.03 10/17/2016   TROPONINI <0.03 10/17/2016   TROPONINI <0.03 10/17/2016   BNP No results for input(s): PROBNP in the last 8760 hours. TSH  Recent Labs  10/17/16 1429  TSH 1.146   CHOLESTEROL  Recent Labs  10/18/16 0403  CHOL 164    Scheduled Meds: . amLODipine  5 mg  Oral Daily  . aspirin EC  81 mg Oral Daily  . atorvastatin  40 mg Oral q1800  . hydrochlorothiazide  12.5 mg Oral Daily  . metoprolol tartrate  12.5 mg Oral BID  . pneumococcal 23 valent vaccine  0.5 mL Intramuscular Tomorrow-1000  . potassium chloride  10 mEq Oral Daily  . sodium chloride flush  3 mL Intravenous Q12H   Continuous Infusions: . heparin 1,350 Units/hr (10/18/16 0443)   PRN Meds:.sodium chloride, acetaminophen, albuterol, nitroGLYCERIN, ondansetron (ZOFRAN) IV, sodium chloride flush  Assessment/Plan: Chest pain Possible CAD Exertional dyspnea Hypertension Obesity Iron deficiency anemia  Cardiac catheterization on Monday. Continue IV heparin   LOS: 0 days    Orpah Cobb  MD  10/18/2016, 3:46 PM

## 2016-10-18 NOTE — Progress Notes (Signed)
ANTICOAGULATION CONSULT NOTE - Follow-up Consult  Pharmacy Consult for heparin Indication: chest pain/ACS  Allergies  Allergen Reactions  . Peanut-Containing Drug Products Anaphylaxis    Oil, nuts, anything peanut related  . Codeine Itching  . Ketorolac Tromethamine Swelling    Patient Measurements: Height:   5'6" Weight: 137 kg IBW: 59.3 kg Heparin Dosing Weight: 93 kg   Vital Signs: Temp: 98.5 F (36.9 C) (04/07 0839) Temp Source: Oral (04/07 0839) BP: 130/78 (04/07 0839) Pulse Rate: 70 (04/07 0839)   Labs:  Recent Labs  10/17/16 1127 10/17/16 1704 10/17/16 2046 10/17/16 2056 10/18/16 0403  HGB 9.1*  --   --   --  8.8*  HCT 30.1*  --   --   --  29.3*  PLT 278  --   --   --  297  LABPROT 12.7  --   --   --   --   INR 0.95  --   --   --   --   HEPARINUNFRC  --   --   --  0.32 0.37  CREATININE 0.97  --   --   --  0.90  TROPONINI <0.03 <0.03 <0.03  --   --      Medical History: Past Medical History:  Diagnosis Date  . Asthma   . Hypertension     Assessment: 51 yo female admitted with chest pain and pressure. Has history of chronic anemia due to menorrhagia. No anticoagulation prior to admission and pharmacy to dose heparin gtt for ACS. Heparin level therapeutic at 0.37 on 1350 units/hr. Hgb 8.8, platelets normal. No overt s/s bleeding noted.   Goal of Therapy:  Heparin level 0.3-0.7 units/ml Monitor platelets by anticoagulation protocol: Yes  Plan:  Continue Heparin gtt at 1350 units/hr Daily heparin level, CBC Monitor for s/s bleeding   York Cerise, PharmD Pharmacy Resident  Pager [phone] 10/18/16 9:41 AM

## 2016-10-18 NOTE — Progress Notes (Signed)
Pt complained of chest pain. EKG and vitals stable. Pt stable. Nitro x1 was given. Pain relieved. Will continue to monitor closely. Pt stated that she experiences this at home as well. At home she sits in the shower to help relieve the pain.

## 2016-10-19 LAB — CBC
HEMATOCRIT: 30.6 % — AB (ref 36.0–46.0)
HEMOGLOBIN: 9.3 g/dL — AB (ref 12.0–15.0)
MCH: 20.9 pg — ABNORMAL LOW (ref 26.0–34.0)
MCHC: 30.4 g/dL (ref 30.0–36.0)
MCV: 68.8 fL — ABNORMAL LOW (ref 78.0–100.0)
Platelets: 278 10*3/uL (ref 150–400)
RBC: 4.45 MIL/uL (ref 3.87–5.11)
RDW: 17 % — ABNORMAL HIGH (ref 11.5–15.5)
WBC: 6.2 10*3/uL (ref 4.0–10.5)

## 2016-10-19 LAB — HEPARIN LEVEL (UNFRACTIONATED): HEPARIN UNFRACTIONATED: 0.44 [IU]/mL (ref 0.30–0.70)

## 2016-10-19 MED ORDER — GABAPENTIN 100 MG PO CAPS
200.0000 mg | ORAL_CAPSULE | Freq: Three times a day (TID) | ORAL | Status: DC | PRN
  Administered 2016-10-19 – 2016-10-20 (×3): 200 mg via ORAL
  Filled 2016-10-19 (×4): qty 2

## 2016-10-19 NOTE — Progress Notes (Signed)
ANTICOAGULATION CONSULT NOTE - Follow-up Consult  Pharmacy Consult for heparin Indication: chest pain/ACS  Allergies  Allergen Reactions  . Peanut-Containing Drug Products Anaphylaxis    Oil, nuts, anything peanut related  . Codeine Itching  . Ketorolac Tromethamine Swelling    Patient Measurements: Height:   5'6" Weight: 137 kg IBW: 59.3 kg Heparin Dosing Weight: 93 kg   Vital Signs: Temp: 98.5 F (36.9 C) (04/08 0757) Temp Source: Oral (04/08 0757) BP: 115/77 (04/08 0816) Pulse Rate: 78 (04/08 0815)   Labs:  Recent Labs  10/17/16 1127 10/17/16 1704 10/17/16 2046 10/17/16 2056 10/18/16 0403 10/19/16 0508  HGB 9.1*  --   --   --  8.8* 9.3*  HCT 30.1*  --   --   --  29.3* 30.6*  PLT 278  --   --   --  297 278  LABPROT 12.7  --   --   --   --   --   INR 0.95  --   --   --   --   --   HEPARINUNFRC  --   --   --  0.32 0.37 0.44  CREATININE 0.97  --   --   --  0.90  --   TROPONINI <0.03 <0.03 <0.03  --   --   --      Medical History: Past Medical History:  Diagnosis Date  . Asthma   . Hypertension     Assessment: 51 yo female admitted with chest pain and pressure. Has history of chronic anemia due to menorrhagia. No anticoagulation prior to admission and pharmacy to dose heparin gtt for ACS. Per cards, nuclear stress and EKG positive for ischemic changes. Per cards, for cath Monday. Heparin level therapeutic at 0.44 on 1350 units/hr. Hgb 9.3, platelets normal. No overt s/s bleeding noted.   Goal of Therapy:  Heparin level 0.3-0.7 units/ml Monitor platelets by anticoagulation protocol: Yes  Plan:  Continue Heparin gtt at 1350 units/hr Daily heparin level, CBC Monitor for s/s bleeding   York Cerise, PharmD Pharmacy Resident  Pager [phone] 10/19/16 8:19 AM

## 2016-10-19 NOTE — Plan of Care (Signed)
Problem: Pain Managment: Goal: General experience of comfort will improve Outcome: Progressing Patient has had complaints of pain, medication given and relief was abtained.

## 2016-10-19 NOTE — Progress Notes (Signed)
Ref: No PCP Per Patient   Subjective:  C/O back pain. Afebrile. No urinary symptoms. Awaiting cardiac cath tomorrow. Monitor shows sinus rhythm. Low iron and saturation with normal B-12, folate and TSH levels.  Objective:  Vital Signs in the last 24 hours: Temp:  [97.9 F (36.6 C)-98.5 F (36.9 C)] 98.5 F (36.9 C) (04/08 0757) Pulse Rate:  [68-78] 78 (04/08 0815) Cardiac Rhythm: Normal sinus rhythm (04/08 0800) Resp:  [15-19] 15 (04/08 0757) BP: (115-140)/(68-85) 115/77 (04/08 0816) SpO2:  [95 %-100 %] 95 % (04/08 0525) Weight:  [136.7 kg (301 lb 4.8 oz)] 136.7 kg (301 lb 4.8 oz) (04/08 0525)  Physical Exam: BP Readings from Last 1 Encounters:  10/19/16 115/77    Wt Readings from Last 1 Encounters:  10/19/16 (!) 136.7 kg (301 lb 4.8 oz)    Weight change: -0.318 kg (-11.2 oz) Body mass index is 48.63 kg/m. HEENT: [place]/AT, Eyes-Brown, PERL, EOMI, Conjunctiva-Pale pink, Sclera-Non-icteric Neck: No JVD, No bruit, Trachea midline. Lungs:  Clear, Bilateral. Cardiac:  Regular rhythm, normal S1 and S2, no S3. II/VI systolic murmur. Abdomen:  Soft, non-tender. BS present. Extremities:  No edema present. No cyanosis. No clubbing. CNS: AxOx3, Cranial nerves grossly intact, moves all 4 extremities.  Skin: Warm and dry.   Intake/Output from previous day: 04/07 0701 - 04/08 0700 In: 1017 [P.O.:720; I.V.:297] Out: -     Lab Results: BMET    Component Value Date/Time   NA 137 10/18/2016 0403   NA 140 10/17/2016 1127   NA 142 07/01/2015 1226   K 3.7 10/18/2016 0403   K 4.1 10/17/2016 1127   K 3.3 (L) 07/01/2015 1226   CL 101 10/18/2016 0403   CL 106 10/17/2016 1127   CL 106 07/01/2015 1226   CO2 25 10/18/2016 0403   CO2 26 10/17/2016 1127   CO2 26 07/01/2015 1226   GLUCOSE 103 (H) 10/18/2016 0403   GLUCOSE 91 10/17/2016 1127   GLUCOSE 92 07/01/2015 1226   BUN 10 10/18/2016 0403   BUN 8 10/17/2016 1127   BUN 8 07/01/2015 1226   CREATININE 0.90 10/18/2016 0403   CREATININE 0.97 10/17/2016 1127   CREATININE 0.89 07/01/2015 1226   CALCIUM 8.9 10/18/2016 0403   CALCIUM 9.3 10/17/2016 1127   CALCIUM 8.9 07/01/2015 1226   GFRNONAA >60 10/18/2016 0403   GFRNONAA >60 10/17/2016 1127   GFRNONAA >60 07/01/2015 1226   GFRAA >60 10/18/2016 0403   GFRAA >60 10/17/2016 1127   GFRAA >60 07/01/2015 1226   CBC    Component Value Date/Time   WBC 6.2 10/19/2016 0508   RBC 4.45 10/19/2016 0508   HGB 9.3 (L) 10/19/2016 0508   HCT 30.6 (L) 10/19/2016 0508   PLT 278 10/19/2016 0508   MCV 68.8 (L) 10/19/2016 0508   MCH 20.9 (L) 10/19/2016 0508   MCHC 30.4 10/19/2016 0508   RDW 17.0 (H) 10/19/2016 0508   LYMPHSABS 2.3 10/17/2016 1127   MONOABS 0.5 10/17/2016 1127   EOSABS 0.1 10/17/2016 1127   BASOSABS 0.1 10/17/2016 1127   HEPATIC Function Panel  Recent Labs  10/17/16 1127  PROT 7.3   HEMOGLOBIN A1C No components found for: HGA1C,  MPG CARDIAC ENZYMES Lab Results  Component Value Date   TROPONINI <0.03 10/17/2016   TROPONINI <0.03 10/17/2016   TROPONINI <0.03 10/17/2016   BNP No results for input(s): PROBNP in the last 8760 hours. TSH  Recent Labs  10/17/16 1429  TSH 1.146   CHOLESTEROL  Recent Labs  10/18/16  0403  CHOL 164    Scheduled Meds: . amLODipine  5 mg Oral Daily  . aspirin EC  81 mg Oral Daily  . atorvastatin  40 mg Oral q1800  . ferrous sulfate  325 mg Oral BID WC  . hydrochlorothiazide  12.5 mg Oral Daily  . metoprolol tartrate  12.5 mg Oral BID  . pneumococcal 23 valent vaccine  0.5 mL Intramuscular Tomorrow-1000  . potassium chloride  10 mEq Oral Daily  . sodium chloride flush  3 mL Intravenous Q12H  . vitamin C  500 mg Oral Q supper   Continuous Infusions: . heparin 1,350 Units/hr (10/19/16 1000)   PRN Meds:.sodium chloride, acetaminophen, albuterol, nitroGLYCERIN, ondansetron (ZOFRAN) IV, sodium chloride flush  Assessment/Plan: Acute coronary syndrome Exertional dyspnea Hypertension Obesity Iron  deficiency anemia  Muscle relaxant as needed. Cardiac cath in AM.   LOS: 1 day    Traylen Eckels  MD  10/19/2016, 11:15 AM     

## 2016-10-19 NOTE — Care Management Note (Signed)
Case Management Note  Patient Details  Name: Sheri Park MRN: [id number] Date of Birth: [birth year]  Subjective/Objective: Received CM consult for assistance with PCP. Spoke with pt by phone and confirmed she has Medicaid [employer]. Assigned to [facility] where she is displeased with PCP services. CM instructed her in process of changing PCP at the local SS office. She was able to obtain appt with [facility] and CM instructed her to take appt card with letter head to [facility] office to expedite change of PCP. Pt in agreement and will follow thru at time of discharge. No additional CM needs at this time.                  Action/Plan:CM will sign off for now but will be available should additional discharge needs arise or disposition change.    Expected Discharge Date:                  Expected Discharge Plan:  Home/Self Care  In-House Referral:  NA, PCP / [facility]  Discharge planning Services  CM Consult  Post Acute Care Choice:  NA Choice offered to:  Patient  DME Arranged:  N/A DME Agency:  NA  HH Arranged:  NA HH Agency:  NA  Status of Service:  Completed, signed off  If discussed at Long Length of Stay Meetings, dates discussed:    Additional Comments:  Yvone Neu, RN 10/19/2016, 9:16 AM

## 2016-10-19 NOTE — Progress Notes (Signed)
Text paged Cards  re: low back soreness that is not relieved with Tylenol, will place s hot pack on her back and await any further orders.

## 2016-10-19 NOTE — Progress Notes (Signed)
Report received in patient's room via [place] RN using SBAR format, reviewed reason for admission, a little PMH, POC, assumed care of patient.

## 2016-10-19 NOTE — Progress Notes (Signed)
Updated report received via Dawn RN ,reviewed new orders and events of the day, assumed care of patient.

## 2016-10-19 NOTE — Progress Notes (Signed)
Text paged MD re: pt's c/o lower back pain and cramping of her legs, not relieved by Tylenol, orders received for Gabapantin 200 mg Tid Prn, will give and continue to monitor.

## 2016-10-19 NOTE — Plan of Care (Signed)
Problem: Safety: Goal: Ability to remain free from injury will improve Outcome: Completed/Met Date Met: 10/19/16 Patient uses call light or phone appropriately and has all personal items within reach,

## 2016-10-19 NOTE — Progress Notes (Signed)
Cards called back and reviewed VS and symptoms, will order her some Gabapentin and continue to monitor, no other changes noted.

## 2016-10-20 ENCOUNTER — Encounter (HOSPITAL_COMMUNITY)
Admission: AD | Disposition: A | Discharge status: Home / Self Care | Payer: Self-pay | Source: Ambulatory Visit | Attending: Cardiovascular Disease

## 2016-10-20 ENCOUNTER — Encounter (HOSPITAL_COMMUNITY): Payer: Self-pay | Admitting: Cardiovascular Disease

## 2016-10-20 HISTORY — PX: LEFT HEART CATH AND CORONARY ANGIOGRAPHY: CATH118249

## 2016-10-20 LAB — CBC
HEMATOCRIT: 30.1 % — AB (ref 36.0–46.0)
HEMOGLOBIN: 8.8 g/dL — AB (ref 12.0–15.0)
MCH: 20.4 pg — AB (ref 26.0–34.0)
MCHC: 29.2 g/dL — AB (ref 30.0–36.0)
MCV: 69.7 fL — AB (ref 78.0–100.0)
Platelets: 273 10*3/uL (ref 150–400)
RBC: 4.32 MIL/uL (ref 3.87–5.11)
RDW: 17.2 % — AB (ref 11.5–15.5)
WBC: 7.7 10*3/uL (ref 4.0–10.5)

## 2016-10-20 LAB — HEPARIN LEVEL (UNFRACTIONATED): HEPARIN UNFRACTIONATED: 0.43 [IU]/mL (ref 0.30–0.70)

## 2016-10-20 LAB — POCT ACTIVATED CLOTTING TIME: Activated Clotting Time: 152 seconds

## 2016-10-20 SURGERY — LEFT HEART CATH AND CORONARY ANGIOGRAPHY
Anesthesia: LOCAL

## 2016-10-20 MED ORDER — FENTANYL CITRATE (PF) 100 MCG/2ML IJ SOLN
INTRAMUSCULAR | Status: AC
  Filled 2016-10-20: qty 2

## 2016-10-20 MED ORDER — IOPAMIDOL (ISOVUE-370) INJECTION 76%
INTRAVENOUS | Status: AC
  Filled 2016-10-20: qty 100

## 2016-10-20 MED ORDER — LIDOCAINE HCL (PF) 1 % IJ SOLN
INTRAMUSCULAR | Status: AC
  Filled 2016-10-20: qty 30

## 2016-10-20 MED ORDER — SODIUM CHLORIDE 0.9% FLUSH: 3.0000 mL | Freq: Two times a day (BID) | INTRAVENOUS | Status: DC

## 2016-10-20 MED ORDER — ASPIRIN EC 81 MG PO TBEC
81.0000 mg | DELAYED_RELEASE_TABLET | Freq: Every day | ORAL | Status: DC
  Administered 2016-10-21: 81 mg via ORAL
  Filled 2016-10-20: qty 1

## 2016-10-20 MED ORDER — SODIUM CHLORIDE 0.9 % IV SOLN
INTRAVENOUS | Status: DC
  Administered 2016-10-20: 07:00:00 via INTRAVENOUS

## 2016-10-20 MED ORDER — SODIUM CHLORIDE 0.9 % IV SOLN: 250.0000 mL | INTRAVENOUS | Status: DC | PRN

## 2016-10-20 MED ORDER — MIDAZOLAM HCL 2 MG/2ML IJ SOLN
INTRAMUSCULAR | Status: AC
  Filled 2016-10-20: qty 2

## 2016-10-20 MED ORDER — FENTANYL CITRATE (PF) 100 MCG/2ML IJ SOLN
INTRAMUSCULAR | Status: DC | PRN
  Administered 2016-10-20 (×2): 25 ug via INTRAVENOUS

## 2016-10-20 MED ORDER — HEPARIN (PORCINE) IN NACL 2-0.9 UNIT/ML-% IJ SOLN
INTRAMUSCULAR | Status: DC | PRN
  Administered 2016-10-20: 1000 mL

## 2016-10-20 MED ORDER — SODIUM CHLORIDE 0.9% FLUSH: 3.0000 mL | INTRAVENOUS | Status: DC | PRN

## 2016-10-20 MED ORDER — SODIUM CHLORIDE 0.9 % IV SOLN: INTRAVENOUS | Status: AC

## 2016-10-20 MED ORDER — SODIUM CHLORIDE 0.9% FLUSH
3.0000 mL | Freq: Two times a day (BID) | INTRAVENOUS | Status: DC
  Administered 2016-10-20: 3 mL via INTRAVENOUS

## 2016-10-20 MED ORDER — LIDOCAINE HCL (PF) 1 % IJ SOLN
INTRAMUSCULAR | Status: DC | PRN
  Administered 2016-10-20: 20 mL via SUBCUTANEOUS

## 2016-10-20 MED ORDER — ASPIRIN 81 MG PO CHEW
81.0000 mg | CHEWABLE_TABLET | ORAL | Status: AC
  Administered 2016-10-20: 81 mg via ORAL
  Filled 2016-10-20: qty 1

## 2016-10-20 MED ORDER — NITROGLYCERIN 1 MG/10 ML FOR IR/CATH LAB
INTRA_ARTERIAL | Status: AC
  Filled 2016-10-20: qty 10

## 2016-10-20 MED ORDER — MIDAZOLAM HCL 2 MG/2ML IJ SOLN
INTRAMUSCULAR | Status: DC | PRN
  Administered 2016-10-20 (×2): 1 mg via INTRAVENOUS

## 2016-10-20 MED ORDER — IOPAMIDOL (ISOVUE-370) INJECTION 76%
INTRAVENOUS | Status: DC | PRN
Start: 2016-10-20 — End: 2016-10-20
  Administered 2016-10-20: 60 mL via INTRA_ARTERIAL

## 2016-10-20 MED ORDER — HEPARIN (PORCINE) IN NACL 2-0.9 UNIT/ML-% IJ SOLN
INTRAMUSCULAR | Status: AC
  Filled 2016-10-20: qty 1000

## 2016-10-20 SURGICAL SUPPLY — 10 items
CATH INFINITI 5FR MULTPACK ANG (CATHETERS) ×2 IMPLANT
HOVERMATT SINGLE USE (MISCELLANEOUS) ×2 IMPLANT
KIT HEART LEFT (KITS) ×2 IMPLANT
NEEDLE SMART 18GX3.5CM LONG (NEEDLE) ×2 IMPLANT
NEEDLE SMART REG 18GX2-3/4 (NEEDLE) ×2 IMPLANT
PACK CARDIAC CATHETERIZATION (CUSTOM PROCEDURE TRAY) ×2 IMPLANT
SHEATH PINNACLE 5F 10CM (SHEATH) ×2 IMPLANT
SYR MEDRAD MARK V 150ML (SYRINGE) ×2 IMPLANT
TRANSDUCER W/STOPCOCK (MISCELLANEOUS) ×2 IMPLANT
WIRE EMERALD 3MM-J .035X150CM (WIRE) ×2 IMPLANT

## 2016-10-20 NOTE — Progress Notes (Signed)
Called into patient room  "stated, she couldn't feel her right leg". Checked for pulses (+), bilateral hand grips equal, smile alligned, no facial droop, patient could move, flex, and feel left foot and leg. Right root didn't feel like she could wiggle toes or point/flex, couldn't feel foot being touched. Called and spoke with Dr. Algie Coffer, he came to bedside and assessed patient. At that point patient wiggled toes on right foot a little bit. Will continue to monitor patients progress as the lidocaine wears off, MD will reassess in a few hours. Patient bedrest is over at 1250. Gave patient  PO tylenol for back pain.

## 2016-10-20 NOTE — Progress Notes (Signed)
Site area: Right groin a 5 french arterial sheath was removed  Site Prior to Removal:  Level 0  Pressure Applied For 15 MINUTES    Bedrest Beginning at 0850a  Manual:   Yes.    Patient Status During Pull:  stable  Post Pull Groin Site:  Level 0  Post Pull Instructions Given:  Yes.    Post Pull Pulses Present:  Yes.    Dressing Applied:  Yes.    Comments:  VS remain stable during sheath pull.  

## 2016-10-20 NOTE — Interval H&P Note (Signed)
History and Physical Interval Note:  10/20/2016 7:38 AM  Sheri Park  has presented today for surgery, with the diagnosis of unstable angina  The various methods of treatment have been discussed with the patient and family. After consideration of risks, benefits and other options for treatment, the patient has consented to  Procedure(s): Left Heart Cath and Coronary Angiography (N/A) as a surgical intervention .  The patient's history has been reviewed, patient examined, no change in status, stable for surgery.  I have reviewed the patient's chart and labs.  Questions were answered to the patient's satisfaction.     Sheri Park S

## 2016-10-20 NOTE — H&P (View-Only) (Signed)
Ref: No PCP Per Patient   Subjective:  C/O back pain. Afebrile. No urinary symptoms. Awaiting cardiac cath tomorrow. Monitor shows sinus rhythm. Low iron and saturation with normal B-12, folate and TSH levels.  Objective:  Vital Signs in the last 24 hours: Temp:  [97.9 F (36.6 C)-98.5 F (36.9 C)] 98.5 F (36.9 C) (04/08 0757) Pulse Rate:  [68-78] 78 (04/08 0815) Cardiac Rhythm: Normal sinus rhythm (04/08 0800) Resp:  [15-19] 15 (04/08 0757) BP: (115-140)/(68-85) 115/77 (04/08 0816) SpO2:  [95 %-100 %] 95 % (04/08 0525) Weight:  [136.7 kg (301 lb 4.8 oz)] 136.7 kg (301 lb 4.8 oz) (04/08 0525)  Physical Exam: BP Readings from Last 1 Encounters:  10/19/16 115/77    Wt Readings from Last 1 Encounters:  10/19/16 (!) 136.7 kg (301 lb 4.8 oz)    Weight change: -0.318 kg (-11.2 oz) Body mass index is 48.63 kg/m. HEENT: [place]/AT, Eyes-Brown, PERL, EOMI, Conjunctiva-Pale pink, Sclera-Non-icteric Neck: No JVD, No bruit, Trachea midline. Lungs:  Clear, Bilateral. Cardiac:  Regular rhythm, normal S1 and S2, no S3. II/VI systolic murmur. Abdomen:  Soft, non-tender. BS present. Extremities:  No edema present. No cyanosis. No clubbing. CNS: AxOx3, Cranial nerves grossly intact, moves all 4 extremities.  Skin: Warm and dry.   Intake/Output from previous day: 04/07 0701 - 04/08 0700 In: 1017 [P.O.:720; I.V.:297] Out: -     Lab Results: BMET    Component Value Date/Time   NA 137 10/18/2016 0403   NA 140 10/17/2016 1127   NA 142 07/01/2015 1226   K 3.7 10/18/2016 0403   K 4.1 10/17/2016 1127   K 3.3 (L) 07/01/2015 1226   CL 101 10/18/2016 0403   CL 106 10/17/2016 1127   CL 106 07/01/2015 1226   CO2 25 10/18/2016 0403   CO2 26 10/17/2016 1127   CO2 26 07/01/2015 1226   GLUCOSE 103 (H) 10/18/2016 0403   GLUCOSE 91 10/17/2016 1127   GLUCOSE 92 07/01/2015 1226   BUN 10 10/18/2016 0403   BUN 8 10/17/2016 1127   BUN 8 07/01/2015 1226   CREATININE 0.90 10/18/2016 0403   CREATININE 0.97 10/17/2016 1127   CREATININE 0.89 07/01/2015 1226   CALCIUM 8.9 10/18/2016 0403   CALCIUM 9.3 10/17/2016 1127   CALCIUM 8.9 07/01/2015 1226   GFRNONAA >60 10/18/2016 0403   GFRNONAA >60 10/17/2016 1127   GFRNONAA >60 07/01/2015 1226   GFRAA >60 10/18/2016 0403   GFRAA >60 10/17/2016 1127   GFRAA >60 07/01/2015 1226   CBC    Component Value Date/Time   WBC 6.2 10/19/2016 0508   RBC 4.45 10/19/2016 0508   HGB 9.3 (L) 10/19/2016 0508   HCT 30.6 (L) 10/19/2016 0508   PLT 278 10/19/2016 0508   MCV 68.8 (L) 10/19/2016 0508   MCH 20.9 (L) 10/19/2016 0508   MCHC 30.4 10/19/2016 0508   RDW 17.0 (H) 10/19/2016 0508   LYMPHSABS 2.3 10/17/2016 1127   MONOABS 0.5 10/17/2016 1127   EOSABS 0.1 10/17/2016 1127   BASOSABS 0.1 10/17/2016 1127   HEPATIC Function Panel  Recent Labs  10/17/16 1127  PROT 7.3   HEMOGLOBIN A1C No components found for: HGA1C,  MPG CARDIAC ENZYMES Lab Results  Component Value Date   TROPONINI <0.03 10/17/2016   TROPONINI <0.03 10/17/2016   TROPONINI <0.03 10/17/2016   BNP No results for input(s): PROBNP in the last 8760 hours. TSH  Recent Labs  10/17/16 1429  TSH 1.146   CHOLESTEROL  Recent Labs  10/18/16  0403  CHOL 164    Scheduled Meds: . amLODipine  5 mg Oral Daily  . aspirin EC  81 mg Oral Daily  . atorvastatin  40 mg Oral q1800  . ferrous sulfate  325 mg Oral BID WC  . hydrochlorothiazide  12.5 mg Oral Daily  . metoprolol tartrate  12.5 mg Oral BID  . pneumococcal 23 valent vaccine  0.5 mL Intramuscular Tomorrow-1000  . potassium chloride  10 mEq Oral Daily  . sodium chloride flush  3 mL Intravenous Q12H  . vitamin C  500 mg Oral Q supper   Continuous Infusions: . heparin 1,350 Units/hr (10/19/16 1000)   PRN Meds:.sodium chloride, acetaminophen, albuterol, nitroGLYCERIN, ondansetron (ZOFRAN) IV, sodium chloride flush  Assessment/Plan: Acute coronary syndrome Exertional dyspnea Hypertension Obesity Iron  deficiency anemia  Muscle relaxant as needed. Cardiac cath in AM.   LOS: 1 day    Orpah Cobb  MD  10/19/2016, 11:15 AM

## 2016-10-21 LAB — BASIC METABOLIC PANEL
Anion gap: 8 (ref 5–15)
BUN: 7 mg/dL (ref 6–20)
CO2: 26 mmol/L (ref 22–32)
CREATININE: 0.86 mg/dL (ref 0.44–1.00)
Calcium: 9.1 mg/dL (ref 8.9–10.3)
Chloride: 105 mmol/L (ref 101–111)
GFR calc Af Amer: >60 mL/min (ref >60)
GFR calc non Af Amer: >60 mL/min (ref >60)
GLUCOSE: 91 mg/dL (ref 65–99)
Potassium: 3.9 mmol/L (ref 3.5–5.1)
Sodium: 139 mmol/L (ref 135–145)

## 2016-10-21 LAB — CBC
HCT: 30.3 % — ABNORMAL LOW (ref 36.0–46.0)
Hemoglobin: 9 g/dL — ABNORMAL LOW (ref 12.0–15.0)
MCH: 20.8 pg — AB (ref 26.0–34.0)
MCHC: 29.7 g/dL — AB (ref 30.0–36.0)
MCV: 70 fL — AB (ref 78.0–100.0)
Platelets: 259 10*3/uL (ref 150–400)
RBC: 4.33 MIL/uL (ref 3.87–5.11)
RDW: 17.5 % — AB (ref 11.5–15.5)
WBC: 6.4 10*3/uL (ref 4.0–10.5)

## 2016-10-21 MED ORDER — ASCORBIC ACID 500 MG PO TABS: 500.0000 mg | ORAL_TABLET | Freq: Two times a day (BID) | ORAL | 3 refills | Status: DC

## 2016-10-21 MED ORDER — FERROUS SULFATE 325 (65 FE) MG PO TABS: 325.0000 mg | ORAL_TABLET | Freq: Two times a day (BID) | ORAL | 3 refills | Status: DC

## 2016-10-21 MED ORDER — GABAPENTIN 100 MG PO CAPS: 100.0000 mg | ORAL_CAPSULE | Freq: Two times a day (BID) | ORAL | 3 refills | Status: DC

## 2016-10-21 NOTE — Discharge Summary (Signed)
Physician Discharge Summary  Patient ID: Sheri Park MRN: [id number] DOB/AGE: 1965/09/11 51 y.o.  Admit date: 10/17/2016 Discharge date: 10/21/2016  Admission Diagnoses: Chest pain Exertional dyspnea Hypertension Obesity Iron deficiency anemia  Discharge Diagnoses:  Principal Problem:   Chest pain on exertion Active Problems:   Hypertension   Obesity   Iron deficiency anemia   H/O menorrhagia   Low back pain  Discharged Condition: fair  Hospital Course: 51 year old female with uncontrolled hypertension and obesity had recurrent chest pain. Her nuclear stress test was suggestive of reversible ischemia however her cardiac cath showed normal coronaries. She had brief episode of right lower leg numbness and weakness that resolved in less than 4 hours of bed rest post cardiac cath. She ambulated well and was discharged home in stable condition. She will see me in 1 week.  Consults: cardiology  Significant Diagnostic Studies: labs: Normal BMET, low Hgb and MCV, normal WBC and Platelets count. Lipid panel near normal except LDL of 105 mg. Serum iron low at 31, and 7 % saturation and low ferritin of 4 ng.  EKG-NSR and antero-lateral ischemia.  Cardiac catheterization: Normal coronaries.  Nuclear stress test: Moderate reversible defect in inferior wall with 62 % EF.  Treatments: cardiac meds: metoprolol, amlodipine and aspirin.  Discharge Exam: Blood pressure 119/71, pulse 89, temperature 97.9 F (36.6 C), temperature source Oral, resp. rate 20, height  (1.676 m), weight 136.1 kg (300 lb), last menstrual period 10/08/2016, SpO2 95 %. General appearance: alert, cooperative and appears stated age. Overnourished. Head: Normocephalic, atraumatic. Eyes: Brown eyes, pink conjunctiva, corneas clear. PERRL, EOM's intact.  Neck: No adenopathy, no carotid bruit, no JVD, supple, symmetrical, trachea midline and thyroid not enlarged. Resp: Clear to auscultation  bilaterally. Cardio: Regular rate and rhythm, S1, S2 normal, II/VI systolic murmur, no click, rub or gallop. GI: Soft, non-tender; bowel sounds normal; no organomegaly. Extremities: No edema, cyanosis or clubbing. No hematoma. Skin: Warm and dry.  Neurologic: Alert and oriented X 3, normal strength and tone. Normal coordination and gait.  Disposition: 01-Home or Self Care   Allergies as of 10/21/2016      Reactions   Peanut-containing Drug Products Anaphylaxis   Oil, nuts, anything peanut related   Codeine Itching   Ketorolac Tromethamine Swelling      Medication List    STOP taking these medications   doxycycline 100 MG tablet Commonly known as:  VIBRA-TABS   NYQUIL COLD & FLU PO     TAKE these medications   acetaminophen 325 MG tablet Commonly known as:  TYLENOL Take 650 mg by mouth every 6 (six) hours as needed for mild pain or moderate pain.   albuterol 108 (90 Base) MCG/ACT inhaler Commonly known as:  PROVENTIL HFA;VENTOLIN HFA Inhale 1-2 puffs into the lungs every 6 (six) hours as needed for wheezing or shortness of breath.   amLODipine 5 MG tablet Commonly known as:  NORVASC Take 1 tablet (5 mg total) by mouth daily.   ascorbic acid 500 MG tablet Commonly known as:  VITAMIN C Take 1 tablet (500 mg total) by mouth 2 (two) times daily with a meal.   aspirin EC 81 MG tablet Take 81 mg by mouth daily.   ferrous sulfate 325 (65 FE) MG tablet Take 1 tablet (325 mg total) by mouth 2 (two) times daily with a meal.   fluticasone 110 MCG/ACT inhaler Commonly known as:  FLOVENT HFA Inhale 1 puff into the lungs 2 (two) times daily.  gabapentin 100 MG capsule Commonly known as:  NEURONTIN Take 1 capsule (100 mg total) by mouth 2 (two) times daily.   hydrochlorothiazide 12.5 MG tablet Commonly known as:  HYDRODIURIL Take 12.5 mg by mouth daily.   metoprolol succinate 25 MG 24 hr tablet Commonly known as:  TOPROL-XL Take 25 mg by mouth daily.   potassium  chloride 10 MEQ tablet Commonly known as:  K-DUR Take 10 mEq by mouth daily.      Follow-up Information    [facility], MD. Schedule an appointment as soon as possible for a visit in 1 week(s).   Specialty:  Cardiology Contact information: [address] [place] [address] [phone]           Signed: Ricki Rodriguez 10/21/2016, 10:04 AM

## 2016-10-21 NOTE — Discharge Instructions (Signed)
Heart-Healthy Eating Plan °Heart-healthy meal planning includes: °· Limiting unhealthy fats. °· Increasing healthy fats. °· Making other small dietary changes. °You may need to talk with your doctor or a diet specialist (dietitian) to create an eating plan that is right for you. °What types of fat should I choose? °· Choose healthy fats. These include olive oil and canola oil, flaxseeds, walnuts, almonds, and seeds. °· Eat more omega-3 fats. These include salmon, mackerel, sardines, tuna, flaxseed oil, and ground flaxseeds. Try to eat fish at least twice each week. °· Limit saturated fats. °¨ Saturated fats are often found in animal products, such as meats, butter, and cream. °¨ Plant sources of saturated fats include palm oil, palm kernel oil, and coconut oil. °· Avoid foods with partially hydrogenated oils in them. These include stick margarine, some tub margarines, cookies, crackers, and other baked goods. These contain trans fats. °What general guidelines do I need to follow? °· Check food labels carefully. Identify foods with trans fats or high amounts of saturated fat. °· Fill one half of your plate with vegetables and green salads. Eat 4-5 servings of vegetables per day. A serving of vegetables is: °¨ 1 cup of raw leafy vegetables. °¨ ½ cup of raw or cooked cut-up vegetables. °¨ ½ cup of vegetable juice. °· Fill one fourth of your plate with whole grains. Look for the word "whole" as the first word in the ingredient list. °· Fill one fourth of your plate with lean protein foods. °· Eat 4-5 servings of fruit per day. A serving of fruit is: °¨ One medium whole fruit. °¨ ¼ cup of dried fruit. °¨ ½ cup of fresh, frozen, or canned fruit. °¨ ½ cup of 100% fruit juice. °· Eat more foods that contain soluble fiber. These include apples, broccoli, carrots, beans, peas, and barley. Try to get 20-30 g of fiber per day. °· Eat more home-cooked food. Eat less restaurant, buffet, and fast food. °· Limit or avoid  alcohol. °· Limit foods high in starch and sugar. °· Avoid fried foods. °· Avoid frying your food. Try baking, boiling, grilling, or broiling it instead. You can also reduce fat by: °¨ Removing the skin from poultry. °¨ Removing all visible fats from meats. °¨ Skimming the fat off of stews, soups, and gravies before serving them. °¨ Steaming vegetables in water or broth. °· Lose weight if you are overweight. °· Eat 4-5 servings of nuts, legumes, and seeds per week: °¨ One serving of dried beans or legumes equals ½ cup after being cooked. °¨ One serving of nuts equals 1½ ounces. °¨ One serving of seeds equals ½ ounce or one tablespoon. °· You may need to keep track of how much salt or sodium you eat. This is especially true if you have high blood pressure. Talk with your doctor or dietitian to get more information. °What foods can I eat? °Grains  °Breads, including French, white, pita, wheat, raisin, rye, oatmeal, and Italian. Tortillas that are neither fried nor made with lard or trans fat. Low-fat rolls, including hotdog and hamburger buns and English muffins. Biscuits. Muffins. Waffles. Pancakes. Light popcorn. Whole-grain cereals. Flatbread. Melba toast. Pretzels. Breadsticks. Rusks. Low-fat snacks. Low-fat crackers, including oyster, saltine, matzo, graham, animal, and rye. Rice and pasta, including brown rice and pastas that are made with whole wheat. °Vegetables  °All vegetables. °Fruits  °All fruits, but limit coconut. °Meats and Other Protein Sources  °Lean, well-trimmed beef, veal, pork, and lamb. Chicken and turkey without skin. All   fish and shellfish. Wild duck, rabbit, pheasant, and venison. Egg whites or low-cholesterol egg substitutes. Dried beans, peas, lentils, and tofu. Seeds and most nuts. Dairy  Low-fat or nonfat cheeses, including ricotta, string, and mozzarella. Skim or 1% milk that is liquid, powdered, or evaporated. Buttermilk that is made with low-fat milk. Nonfat or low-fat  yogurt. Beverages  Mineral water. Diet carbonated beverages. Sweets and Desserts  Sherbets and fruit ices. Honey, jam, marmalade, jelly, and syrups. Meringues and gelatins. Pure sugar candy, such as hard candy, jelly beans, gumdrops, mints, marshmallows, and small amounts of dark chocolate. [employer]. Eat all sweets and desserts in moderation. Fats and Oils  Nonhydrogenated (trans-free) margarines. Vegetable oils, including soybean, sesame, sunflower, olive, peanut, safflower, corn, canola, and cottonseed. Salad dressings or mayonnaise made with a vegetable oil. Limit added fats and oils that you use for cooking, baking, salads, and as spreads. Other  Cocoa powder. Coffee and tea. All seasonings and condiments. The items listed above may not be a complete list of recommended foods or beverages. Contact your dietitian for more options.  What foods are not recommended? Grains  Breads that are made with saturated or trans fats, oils, or whole milk. Croissants. Butter rolls. Cheese breads. Sweet rolls. Donuts. Buttered popcorn. Chow mein noodles. High-fat crackers, such as cheese or butter crackers. Meats and Other Protein Sources  Fatty meats, such as hotdogs, short ribs, sausage, spareribs, bacon, rib eye roast or steak, and mutton. High-fat deli meats, such as salami and bologna. Caviar. Domestic duck and goose. Organ meats, such as kidney, liver, sweetbreads, and heart. Dairy  Cream, sour cream, cream cheese, and creamed cottage cheese. Whole-milk cheeses, including blue (bleu), [address], [place], [place], [address], [place], [address], cheddar, [place], and [place]. Whole or 2% milk that is liquid, evaporated, or condensed. Whole buttermilk. Cream sauce or high-fat cheese sauce. Yogurt that is made from whole milk. Beverages  Regular sodas and juice drinks with added sugar. Sweets and Desserts  Frosting. Pudding. Cookies. Cakes other than angel food cake. Candy that has milk chocolate or  white chocolate, hydrogenated fat, butter, coconut, or unknown ingredients. Buttered syrups. Full-fat ice cream or ice cream drinks. Fats and Oils  Gravy that has suet, meat fat, or shortening. Cocoa butter, hydrogenated oils, palm oil, coconut oil, palm kernel oil. These can often be found in baked products, candy, fried foods, nondairy creamers, and whipped toppings. Solid fats and shortenings, including bacon fat, salt pork, lard, and butter. Nondairy cream substitutes, such as coffee creamers and sour cream substitutes. Salad dressings that are made of unknown oils, cheese, or sour cream. The items listed above may not be a complete list of foods and beverages to avoid. Contact your dietitian for more information.  This information is not intended to replace advice given to you by your health care provider. Make sure you discuss any questions you have with your health care provider. Document Released: 12/30/2011 Document Revised: 12/06/2015 Document Reviewed: 12/22/2013 Elsevier Interactive Patient Education  2017 [employer]     Preventing Hypertension Hypertension, commonly called high blood pressure, is when the force of blood pumping through the arteries is too strong. Arteries are blood vessels that carry blood from the heart throughout the body. Over time, hypertension can damage the arteries and decrease blood flow to important parts of the body, including the brain, heart, and kidneys. Often, hypertension does not cause symptoms until blood pressure is very high. For this reason, it is important to have your blood pressure  checked on a regular basis. Hypertension can often be prevented with diet and lifestyle changes. If you already have hypertension, you can control it with diet and lifestyle changes, as well as medicine. What nutrition changes can be made? Maintain a healthy diet. This includes:  Eating less salt (sodium). Ask your health care provider how much sodium is safe for  you to have. The general recommendation is to consume less than 1 tsp (2,300 mg) of sodium a day.  Do not add salt to your food.  Choose low-sodium options when grocery shopping and eating out.  Limiting fats in your diet. You can do this by eating low-fat or fat-free dairy products and by eating less red meat.  Eating more fruits, vegetables, and whole grains. Make a goal to eat:  1-2 cups of fresh fruits and vegetables each day.  3-4 servings of whole grains each day.  Avoiding foods and beverages that have added sugars.  Eating fish that contain healthy fats (omega-3 fatty acids), such as mackerel or salmon. If you need help putting together a healthy eating plan, try the DASH diet. This diet is high in fruits, vegetables, and whole grains. It is low in sodium, red meat, and added sugars. DASH stands for Dietary Approaches to Stop Hypertension. What lifestyle changes can be made?  Lose weight if you are overweight. Losing just 3?5% of your body weight can help prevent or control hypertension.  For example, if your present weight is 200 lb (91 kg), a loss of 3-5% of your weight means losing 6-10 lb (2.7-4.5 kg).  Ask your health care provider to help you with a diet and exercise plan to safely lose weight.  Get enough exercise. Do at least 150 minutes of moderate-intensity exercise each week.  You could do this in short exercise sessions several times a day, or you could do longer exercise sessions a few times a week. For example, you could take a brisk 10-minute walk or bike ride, 3 times a day, for 5 days a week.  Find ways to reduce stress, such as exercising, meditating, listening to music, or taking a yoga class. If you need help reducing stress, ask your health care provider.  Do not smoke. This includes e-cigarettes. Chemicals in tobacco and nicotine products raise your blood pressure each time you smoke. If you need help quitting, ask your health care provider.  Avoid  alcohol. If you drink alcohol, limit alcohol intake to no more than 1 drink a day for nonpregnant women and 2 drinks a day for men. One drink equals 12 oz of beer, 5 oz of wine, or 1 oz of hard liquor. Why are these changes important? Diet and lifestyle changes can help you prevent hypertension, and they may make you feel better overall and improve your quality of life. If you have hypertension, making these changes will help you control it and help prevent major complications, such as:  Hardening and narrowing of arteries that supply blood to:  Your heart. This can cause a heart attack.  Your brain. This can cause a stroke.  Your kidneys. This can cause kidney failure.  Stress on your heart muscle, which can cause heart failure. What can I do to lower my risk?  Work with your health care provider to make a hypertension prevention plan that works for you. Follow your plan and keep all follow-up visits as told by your health care provider.  Learn how to check your blood pressure at home. Make sure  that you know your personal target blood pressure, as told by your health care provider. How is this treated? In addition to diet and lifestyle changes, your health care provider may recommend medicines to help lower your blood pressure. You may need to try a few different medicines to find what works best for you. You also may need to take more than one medicine. Take over-the-counter and prescription medicines only as told by your health care provider. Where to find support: Your health care provider can help you prevent hypertension and help you keep your blood pressure at a healthy level. Your local hospital or your community may also provide support services and prevention programs. The American Heart Association offers an online support network at: https://www.lee.net/ Where to find more information: Learn more about hypertension from:  National Heart, Lung,  and Blood Institute: https://www.peterson.org/  Centers for Disease Control and Prevention: AboutHD.co.nz  American Academy of Family Physicians: http://familydoctor.org/familydoctor/en/diseases-conditions/high-blood-pressure.printerview.all.html Learn more about the DASH diet from:  National Heart, Lung, and Blood Institute: WedMap.it Contact a health care provider if:  You think you are having a reaction to medicines you have taken.  You have recurrent headaches or feel dizzy.  You have swelling in your ankles.  You have trouble with your vision. Summary  Hypertension often does not cause any symptoms until blood pressure is very high. It is important to get your blood pressure checked regularly.  Diet and lifestyle changes are the most important steps in preventing hypertension.  By keeping your blood pressure in a healthy range, you can prevent complications like heart attack, heart failure, stroke, and kidney failure.  Work with your health care provider to make a hypertension prevention plan that works for you. This information is not intended to replace advice given to you by your health care provider. Make sure you discuss any questions you have with your health care provider. Document Released: 07/15/2015 Document Revised: 03/10/2016 Document Reviewed: 03/10/2016 Elsevier Interactive Patient Education  2017 Else[employer] Inc.    Femoral Site Care Refer to this sheet in the next few weeks. These instructions provide you with information about caring for yourself after your procedure. Your health care provider may also give you more specific instructions. Your treatment has been planned according to current medical practices, but problems sometimes occur. Call your health care provider if you have any problems or questions after your procedure. What can I expect after the procedure? After your  procedure, it is typical to have the following:  Bruising at the site that usually fades within 1-2 weeks.  Blood collecting in the tissue (hematoma) that may be painful to the touch. It should usually decrease in size and tenderness within 1-2 weeks. Follow these instructions at home:  Take medicines only as directed by your health care provider.  You may shower 24-48 hours after the procedure or as directed by your health care provider. Remove the bandage (dressing) and gently wash the site with plain soap and water. Pat the area dry with a clean towel. Do not rub the site, because this may cause bleeding.  Do not take baths, swim, or use a hot tub until your health care provider approves.  Check your insertion site every day for redness, swelling, or drainage.  Do not apply powder or lotion to the site.  Limit use of stairs to twice a day for the first 2-3 days or as directed by your health care provider.  Do not squat for the first 2-3 days or as  directed by your health care provider.  Do not lift over 10 lb (4.5 kg) for 5 days after your procedure or as directed by your health care provider.  Ask your health care provider when it is okay to:  Return to work or school.  Resume usual physical activities or sports.  Resume sexual activity.  Do not drive home if you are discharged the same day as the procedure. Have someone else drive you.  You may drive 24 hours after the procedure unless otherwise instructed by your health care provider.  Do not operate machinery or power tools for 24 hours after the procedure or as directed by your health care provider.  If your procedure was done as an outpatient procedure, which means that you went home the same day as your procedure, a responsible adult should be with you for the first 24 hours after you arrive home.  Keep all follow-up visits as directed by your health care provider. This is important. Contact a health care provider  if:  You have a fever.  You have chills.  You have increased bleeding from the site. Hold pressure on the site. Get help right away if:  You have unusual pain at the site.  You have redness, warmth, or swelling at the site.  You have drainage (other than a small amount of blood on the dressing) from the site.  The site is bleeding, and the bleeding does not stop after 30 minutes of holding steady pressure on the site.  Your leg or foot becomes pale, cool, tingly, or numb. This information is not intended to replace advice given to you by your health care provider. Make sure you discuss any questions you have with your health care provider. Document Released: 03/03/2014 Document Revised: 12/06/2015 Document Reviewed: 01/17/2014 Elsevier Interactive Patient Education  2017 [employer].

## 2016-12-12 ENCOUNTER — Encounter (HOSPITAL_COMMUNITY): Payer: Self-pay | Admitting: Emergency Medicine

## 2016-12-12 ENCOUNTER — Ambulatory Visit (HOSPITAL_COMMUNITY)
Admission: EM | Admit: 2016-12-12 | Discharge: 2016-12-12 | Disposition: A | Discharge status: Home / Self Care | Payer: Medicaid Other | Attending: Family Medicine | Admitting: Family Medicine

## 2016-12-12 DIAGNOSIS — M7989 Other specified soft tissue disorders: Secondary | ICD-10-CM | POA: Diagnosis not present

## 2016-12-12 DIAGNOSIS — M545 Low back pain, unspecified: Secondary | ICD-10-CM

## 2016-12-12 DIAGNOSIS — R2243 Localized swelling, mass and lump, lower limb, bilateral: Secondary | ICD-10-CM | POA: Diagnosis not present

## 2016-12-12 LAB — POCT URINALYSIS DIP (DEVICE)
Bilirubin Urine: NEGATIVE
GLUCOSE, UA: NEGATIVE mg/dL
Hgb urine dipstick: NEGATIVE
Ketones, ur: NEGATIVE mg/dL
LEUKOCYTES UA: NEGATIVE
NITRITE: NEGATIVE
PROTEIN: NEGATIVE mg/dL
SPECIFIC GRAVITY, URINE: 1.02 (ref 1.005–1.030)
UROBILINOGEN UA: 0.2 mg/dL (ref 0.0–1.0)
pH: 5 (ref 5.0–8.0)

## 2016-12-12 MED ORDER — CYCLOBENZAPRINE HCL 10 MG PO TABS: 10.0000 mg | ORAL_TABLET | Freq: Two times a day (BID) | ORAL | 0 refills | Status: DC | PRN

## 2016-12-12 MED ORDER — NAPROXEN 500 MG PO TABS: 500.0000 mg | ORAL_TABLET | Freq: Two times a day (BID) | ORAL | 0 refills | Status: DC

## 2016-12-12 NOTE — Discharge Instructions (Signed)
Continue to elevate legs. Keep moving.  EXERCISES  RANGE OF MOTION (ROM) AND STRETCHING EXERCISES - Low Back Sprain Most people with lower back pain will find that their symptoms get worse with excessive bending forward (flexion) or arching at the lower back (extension). The exercises that will help resolve your symptoms will focus on the opposite motion.  Your physician, physical therapist or athletic trainer will help you determine which exercises will be most helpful to resolve your lower back pain. Do not complete any exercises without first consulting with your caregiver. Discontinue any exercises which make your symptoms worse, until you speak to your caregiver. If you have pain, numbness or tingling which travels down into your buttocks, leg or foot, the goal of the therapy is for these symptoms to move closer to your back and eventually resolve. Sometimes, these leg symptoms will get better, but your lower back pain may worsen. This is often an indication of progress in your rehabilitation. Be very alert to any changes in your symptoms and the activities in which you participated in the 24 hours prior to the change. Sharing this information with your caregiver will allow him or her to most efficiently treat your condition. These exercises may help you when beginning to rehabilitate your injury. Your symptoms may resolve with or without further involvement from your physician, physical therapist or athletic trainer. While completing these exercises, remember:  Restoring tissue flexibility helps normal motion to return to the joints. This allows healthier, less painful movement and activity. An effective stretch should be held for at least 30 seconds. A stretch should never be painful. You should only feel a gentle lengthening or release in the stretched tissue. FLEXION RANGE OF MOTION AND STRETCHING EXERCISES:  STRETCH - Flexion, Single Knee to Chest  Lie on a firm bed or floor with both legs  extended in front of you. Keeping one leg in contact with the floor, bring your opposite knee to your chest. Hold your leg in place by either grabbing behind your thigh or at your knee. Pull until you feel a gentle stretch in your low back. Hold 15-20 seconds. Slowly release your grasp and repeat the exercise with the opposite side. Repeat 2 times. Complete this exercise 1-2 times per day.   STRETCH - Flexion, Double Knee to Chest Lie on a firm bed or floor with both legs extended in front of you. Keeping one leg in contact with the floor, bring your opposite knee to your chest. Tense your stomach muscles to support your back and then lift your other knee to your chest. Hold your legs in place by either grabbing behind your thighs or at your knees. Pull both knees toward your chest until you feel a gentle stretch in your low back. Hold 15-20 seconds. Tense your stomach muscles and slowly return one leg at a time to the floor. Repeat 2 times. Complete this exercise 1-2 times per day.   STRETCH - Low Trunk Rotation Lie on a firm bed or floor. Keeping your legs in front of you, bend your knees so they are both pointed toward the ceiling and your feet are flat on the floor. Extend your arms out to the side. This will stabilize your upper body by keeping your shoulders in contact with the floor. Gently and slowly drop both knees together to one side until you feel a gentle stretch in your low back. Hold for 15-20 seconds. Tense your stomach muscles to support your lower back as you  bring your knees back to the starting position. Repeat the exercise to the other side. Repeat 2 times. Complete this exercise 1-2 times per day  EXTENSION RANGE OF MOTION AND FLEXIBILITY EXERCISES:  STRETCH - Extension, Prone on Elbows  Lie on your stomach on the floor, a bed will be too soft. Place your palms about shoulder width apart and at the height of your head. Place your elbows under your shoulders. If this is  too painful, stack pillows under your chest. Allow your body to relax so that your hips drop lower and make contact more completely with the floor. Hold this position for 15-20 seconds. Slowly return to lying flat on the floor. Repeat 2 times. Complete this exercise 1-2 times per day.   RANGE OF MOTION - Extension, Prone Press Ups Lie on your stomach on the floor, a bed will be too soft. Place your palms about shoulder width apart and at the height of your head. Keeping your back as relaxed as possible, slowly straighten your elbows while keeping your hips on the floor. You may adjust the placement of your hands to maximize your comfort. As you gain motion, your hands will come more underneath your shoulders. Hold this position 15-20 seconds. Slowly return to lying flat on the floor. Repeat 2 times. Complete this exercise 1-2 times per day.   RANGE OF MOTION- Quadruped, Neutral Spine  Assume a hands and knees position on a firm surface. Keep your hands under your shoulders and your knees under your hips. You may place padding under your knees for comfort. Drop your head and point your tailbone toward the ground below you. This will round out your lower back like an angry cat. Hold this position for 15-20 seconds. Slowly lift your head and release your tail bone so that your back sags into a large arch, like an old horse. Hold this position for 15-20 seconds. Repeat this until you feel limber in your low back. Now, find your "sweet spot." This will be the most comfortable position somewhere between the two previous positions. This is your neutral spine. Once you have found this position, tense your stomach muscles to support your low back. Hold this position for 15-20 seconds. Repeat 2 times. Complete this exercise 1-2 times per day.  STRENGTHENING EXERCISES - Low Back Sprain These exercises may help you when beginning to rehabilitate your injury. These exercises should be done near your "sweet  spot." This is the neutral, low-back arch, somewhere between fully rounded and fully arched, that is your least painful position. When performed in this safe range of motion, these exercises can be used for people who have either a flexion or extension based injury. These exercises may resolve your symptoms with or without further involvement from your physician, physical therapist or athletic trainer. While completing these exercises, remember:  Muscles can gain both the endurance and the strength needed for everyday activities through controlled exercises. Complete these exercises as instructed by your physician, physical therapist or athletic trainer. Increase the resistance and repetitions only as guided. You may experience muscle soreness or fatigue, but the pain or discomfort you are trying to eliminate should never worsen during these exercises. If this pain does worsen, stop and make certain you are following the directions exactly. If the pain is still present after adjustments, discontinue the exercise until you can discuss the trouble with your caregiver.  STRENGTHENING - Deep Abdominals, Pelvic Tilt  Lie on a firm bed or floor. Keeping your legs  in front of you, bend your knees so they are both pointed toward the ceiling and your feet are flat on the floor. Tense your lower abdominal muscles to press your low back into the floor. This motion will rotate your pelvis so that your tail bone is scooping upwards rather than pointing at your feet or into the floor. With a gentle tension and even breathing, hold this position for 10-15 seconds. Repeat 2 times. Complete this exercise 1 time per day.   STRENGTHENING - Abdominals, Crunches  Lie on a firm bed or floor. Keeping your legs in front of you, bend your knees so they are both pointed toward the ceiling and your feet are flat on the floor. Cross your arms over your chest. Slightly tip your chin down without bending your neck. Tense your  abdominals and slowly lift your trunk high enough to just clear your shoulder blades. Lifting higher can put excessive stress on the lower back and does not further strengthen your abdominal muscles. Control your return to the starting position. Repeat 2 times. Complete this exercise once every 1-2 days.   STRENGTHENING - Quadruped, Opposite UE/LE Lift  Assume a hands and knees position on a firm surface. Keep your hands under your shoulders and your knees under your hips. You may place padding under your knees for comfort. Find your neutral spine and gently tense your abdominal muscles so that you can maintain this position. Your shoulders and hips should form a rectangle that is parallel with the floor and is not twisted. Keeping your trunk steady, lift your right hand no higher than your shoulder and then your left leg no higher than your hip. Make sure you are not holding your breath. Hold this position for 15-20 seconds. Continuing to keep your abdominal muscles tense and your back steady, slowly return to your starting position. Repeat with the opposite arm and leg. Repeat 2 times. Complete this exercise once every 1-2 days.   STRENGTHENING - Abdominals and Quadriceps, Straight Leg Raise  Lie on a firm bed or floor with both legs extended in front of you. Keeping one leg in contact with the floor, bend the other knee so that your foot can rest flat on the floor. Find your neutral spine, and tense your abdominal muscles to maintain your spinal position throughout the exercise. Slowly lift your straight leg off the floor about 6 inches for a count of 15, making sure to not hold your breath. Still keeping your neutral spine, slowly lower your leg all the way to the floor. Repeat this exercise with each leg 2 times. Complete this exercise once every 1-2 days. POSTURE AND BODY MECHANICS CONSIDERATIONS - Low Back Sprain Keeping correct posture when sitting, standing or completing your activities  will reduce the stress put on different body tissues, allowing injured tissues a chance to heal and limiting painful experiences. The following are general guidelines for improved posture. Your physician or physical therapist will provide you with any instructions specific to your needs. While reading these guidelines, remember: The exercises prescribed by your provider will help you have the flexibility and strength to maintain correct postures. The correct posture provides the best environment for your joints to work. All of your joints have less wear and tear when properly supported by a spine with good posture. This means you will experience a healthier, less painful body. Correct posture must be practiced with all of your activities, especially prolonged sitting and standing. Correct posture is as important  when doing repetitive low-stress activities (typing) as it is when doing a single heavy-load activity (lifting).  RESTING POSITIONS Consider which positions are most painful for you when choosing a resting position. If you have pain with flexion-based activities (sitting, bending, stooping, squatting), choose a position that allows you to rest in a less flexed posture. You would want to avoid curling into a fetal position on your side. If your pain worsens with extension-based activities (prolonged standing, working overhead), avoid resting in an extended position such as sleeping on your stomach. Most people will find more comfort when they rest with their spine in a more neutral position, neither too rounded nor too arched. Lying on a non-sagging bed on your side with a pillow between your knees, or on your back with a pillow under your knees will often provide some relief. Keep in mind, being in any one position for a prolonged period of time, no matter how correct your posture, can still lead to stiffness. PROPER SITTING POSTURE In order to minimize stress and discomfort on your spine, you must  sit with correct posture. Sitting with good posture should be effortless for a healthy body. Returning to good posture is a gradual process. Many people can work toward this most comfortably by using various supports until they have the flexibility and strength to maintain this posture on their own. When sitting with proper posture, your ears will fall over your shoulders and your shoulders will fall over your hips. You should use the back of the chair to support your upper back. Your lower back will be in a neutral position, just slightly arched. You may place a small pillow or folded towel at the base of your lower back for  support.  When working at a desk, create an environment that supports good, upright posture. Without extra support, muscles tire, which leads to excessive strain on joints and other tissues. Keep these recommendations in mind:  CHAIR: A chair should be able to slide under your desk when your back makes contact with the back of the chair. This allows you to work closely. The chair's height should allow your eyes to be level with the upper part of your monitor and your hands to be slightly lower than your elbows.  BODY POSITION Your feet should make contact with the floor. If this is not possible, use a foot rest. Keep your ears over your shoulders. This will reduce stress on your neck and low back.  INCORRECT SITTING POSTURES  If you are feeling tired and unable to assume a healthy sitting posture, do not slouch or slump. This puts excessive strain on your back tissues, causing more damage and pain. Healthier options include: Using more support, like a lumbar pillow. Switching tasks to something that requires you to be upright or walking. Talking a brief walk. Lying down to rest in a neutral-spine position.  PROLONGED STANDING WHILE SLIGHTLY LEANING FORWARD  When completing a task that requires you to lean forward while standing in one place for a long time, place either  foot up on a stationary 2-4 inch high object to help maintain the best posture. When both feet are on the ground, the lower back tends to lose its slight inward curve. If this curve flattens (or becomes too large), then the back and your other joints will experience too much stress, tire more quickly, and can cause pain.  CORRECT STANDING POSTURES Proper standing posture should be assumed with all daily activities, even if they  only take a few moments, like when brushing your teeth. As in sitting, your ears should fall over your shoulders and your shoulders should fall over your hips. You should keep a slight tension in your abdominal muscles to brace your spine. Your tailbone should point down to the ground, not behind your body, resulting in an over-extended swayback posture.   INCORRECT STANDING POSTURES  Common incorrect standing postures include a forward head, locked knees and/or an excessive swayback. WALKING Walk with an upright posture. Your ears, shoulders and hips should all line-up.  PROLONGED ACTIVITY IN A FLEXED POSITION When completing a task that requires you to bend forward at your waist or lean over a low surface, try to find a way to stabilize 3 out of 4 of your limbs. You can place a hand or elbow on your thigh or rest a knee on the surface you are reaching across. This will provide you more stability, so that your muscles do not tire as quickly. By keeping your knees relaxed, or slightly bent, you will also reduce stress across your lower back. CORRECT LIFTING TECHNIQUES  DO : Assume a wide stance. This will provide you more stability and the opportunity to get as close as possible to the object which you are lifting. Tense your abdominals to brace your spine. Bend at the knees and hips. Keeping your back locked in a neutral-spine position, lift using your leg muscles. Lift with your legs, keeping your back straight. Test the weight of unknown objects before attempting to lift  them. Try to keep your elbows locked down at your sides in order get the best strength from your shoulders when carrying an object.   Always ask for help when lifting heavy or awkward objects. INCORRECT LIFTING TECHNIQUES DO NOT:  Lock your knees when lifting, even if it is a small object. Bend and twist. Pivot at your feet or move your feet when needing to change directions. Assume that you can safely pick up even a paperclip without proper posture.

## 2016-12-12 NOTE — ED Provider Notes (Signed)
[facility]    CSN: [id number] Arrival date & time: 12/12/16  1816     History   Chief Complaint Chief Complaint  Patient presents with  . Back Pain  . Foot Swelling    HPI Sheri Park is a 51 y.o. female.   HPI  Patient presents with a six-day history of bilateral low back pain. It does not radiate. She has associated decreased range of motion and difficulty laying down. Hot past medical letter, bending over and movement make it worse. She's not having any weakness, numbness, tingling, or bowel/bladder incontinence. The patient has tried BenGay with no relief. She is not tried anything at home otherwise.  She is also been expressing bilateral lower extremity swelling. This is been going on for 6 days also. She has been trying to elevate her legs and stay active, however this is been difficult with her back. She is not having any shortness of breath or cough. She is not known to have a history of heart failure, liver failure, or renal failure. She does not have compression stockings at home.  Past Medical History:  Diagnosis Date  . Asthma   . Hypertension     Patient Active Problem List   Diagnosis Date Noted  . Acute coronary syndrome (HCC) 10/18/2016  . Chest pain on exertion 10/17/2016    Past Surgical History:  Procedure Laterality Date  . KNEE SURGERY    . LEFT HEART CATH AND CORONARY ANGIOGRAPHY N/A 10/20/2016   Procedure: Left Heart Cath and Coronary Angiography;  Surgeon: Orpah CobbAjay Kadakia, MD;  Location: [facility];  Service: Cardiovascular;  Laterality: N/A;  . TUBAL LIGATION       Home Medications    Prior to Admission medications   Medication Sig Start Date End Date Taking? Authorizing Provider  acetaminophen (TYLENOL) 325 MG tablet Take 650 mg by mouth every 6 (six) hours as needed for mild pain or moderate pain.   Yes [provider]  albuterol (PROVENTIL HFA;VENTOLIN HFA) 108 (90 BASE) MCG/ACT inhaler Inhale 1-2 puffs into  the lungs every 6 (six) hours as needed for wheezing or shortness of breath.   Yes [provider]  amLODipine (NORVASC) 5 MG tablet Take 1 tablet (5 mg total) by mouth daily. 04/07/16  Yes Elvina SidleLauenstein, Kurt, MD  aspirin EC 81 MG tablet Take 81 mg by mouth daily.   Yes [provider]  ferrous sulfate 325 (65 FE) MG tablet Take 1 tablet (325 mg total) by mouth 2 (two) times daily with a meal. 10/21/16  Yes Orpah CobbKadakia, Ajay, MD  fluticasone (FLOVENT HFA) 110 MCG/ACT inhaler Inhale 1 puff into the lungs 2 (two) times daily. 12/06/14  Yes Linwood DibblesKnapp, Jon, MD  gabapentin (NEURONTIN) 100 MG capsule Take 1 capsule (100 mg total) by mouth 2 (two) times daily. 10/21/16  Yes Orpah CobbKadakia, Ajay, MD  hydrochlorothiazide (HYDRODIURIL) 12.5 MG tablet Take 12.5 mg by mouth daily.   Yes [provider]  metoprolol succinate (TOPROL-XL) 25 MG 24 hr tablet Take 25 mg by mouth daily.   Yes [provider]  potassium chloride (K-DUR) 10 MEQ tablet Take 10 mEq by mouth daily.   Yes [provider]  vitamin C (VITAMIN C) 500 MG tablet Take 1 tablet (500 mg total) by mouth 2 (two) times daily with a meal. 10/21/16  Yes Orpah CobbKadakia, Ajay, MD  cyclobenzaprine (FLEXERIL) 10 MG tablet Take 1 tablet (10 mg total) by mouth 2 (two) times daily as needed for muscle  spasms. 12/12/16   Sharlene Dory, DO  naproxen (NAPROSYN) 500 MG tablet Take 1 tablet (500 mg total) by mouth 2 (two) times daily. 12/12/16   Sharlene Dory, DO    Family History Family History  Problem Relation Age of Onset  . Diabetes Mother   . Cancer Mother     Social History Social History  Substance Use Topics  . Smoking status: Never Smoker  . Smokeless tobacco: Never Used  . Alcohol use No     Allergies   Peanut-containing drug products; Codeine; and Ketorolac tromethamine   Review of Systems Review of Systems  Musculoskeletal: Positive for back pain.  Neurological: Negative for weakness and numbness.      Physical Exam Triage Vital Signs ED Triage Vitals  Enc Vitals Group     BP 12/12/16 1906 128/70     Pulse Rate 12/12/16 1906 75     Resp 12/12/16 1906 16     Temp 12/12/16 1906 98 F (36.7 C)     Temp Source 12/12/16 1906 Oral     SpO2 12/12/16 1906 98 %   Updated Vital Signs BP 128/70 (BP Location: Right Arm)   Pulse 75   Temp 98 F (36.7 C) (Oral)   Resp 16   LMP 11/30/2016 (Exact Date)   SpO2 98%   Physical Exam  Constitutional: She is oriented to person, place, and time. She appears well-developed and well-nourished.  Neck: Normal range of motion. Neck supple.  Cardiovascular: Normal rate, regular rhythm and normal heart sounds.  Exam reveals no gallop.   Pulmonary/Chest: Effort normal and breath sounds normal. No respiratory distress. She has no wheezes. She has no rales.  Musculoskeletal:  5/5 strength throughout in LE's b/l. +TTP over lumbar paraspinal musculature. +TTP over SI joint b/l.  Neurological: She is alert and oriented to person, place, and time. She displays normal reflexes. No sensory deficit.     UC Treatments / Results  Procedures Procedures none  Initial Impression / Assessment and Plan / UC Course  I have reviewed the triage vital signs and the nursing notes.  Pertinent labs & imaging results that were available during my care of the patient were reviewed by me and considered in my medical decision making (see chart for details).     Pt presents with acute low back pain. Counseled on diet and exercise, home stretches/exercises for LBP given. She is doing a good job with her wt loss efforts. Anti-inflammatory and muscle relaxant also given. Do not use Flexeril during day if it makes drowsy. LE edema like dependent in etiology. Cont weight loss efforts. No PE findings suggestive of CHF. F/u with PCP to explore this further and possibly get compression stockings. Pt to be discharged in stable condition. She voiced understanding and agreement to  the plan.  Final Clinical Impressions(s) / UC Diagnoses   Final diagnoses:  Acute bilateral low back pain without sciatica  Localized swelling of both lower extremities    New Prescriptions Discharge Medication List as of 12/12/2016  7:32 PM    START taking these medications   Details  cyclobenzaprine (FLEXERIL) 10 MG tablet Take 1 tablet (10 mg total) by mouth 2 (two) times daily as needed for muscle spasms., Starting Fri 12/12/2016, Normal    naproxen (NAPROSYN) 500 MG tablet Take 1 tablet (500 mg total) by mouth 2 (two) times daily., Starting Fri 12/12/2016, Normal         Sharlene Dory, [place] 12/12/16 2137

## 2016-12-12 NOTE — ED Triage Notes (Signed)
Pt c/o feet swelling x 6 days. Has been elevating with no relief. Patient also c/o lower back pain x 6 days. Hurts to sleep on her back. Has tried topical ointments such as bengay with no relief. Patient is in NAD.

## 2017-04-20 ENCOUNTER — Encounter (HOSPITAL_COMMUNITY): Payer: Self-pay | Admitting: Emergency Medicine

## 2017-04-20 ENCOUNTER — Ambulatory Visit (HOSPITAL_COMMUNITY)
Admission: EM | Admit: 2017-04-20 | Discharge: 2017-04-20 | Disposition: A | Discharge status: Home / Self Care | Payer: Medicaid Other | Attending: Family Medicine | Admitting: Family Medicine

## 2017-04-20 DIAGNOSIS — R8290 Unspecified abnormal findings in urine: Secondary | ICD-10-CM

## 2017-04-20 DIAGNOSIS — R1030 Lower abdominal pain, unspecified: Secondary | ICD-10-CM | POA: Diagnosis not present

## 2017-04-20 DIAGNOSIS — R109 Unspecified abdominal pain: Secondary | ICD-10-CM

## 2017-04-20 LAB — POCT URINALYSIS DIP (DEVICE)
BILIRUBIN URINE: NEGATIVE
GLUCOSE, UA: NEGATIVE mg/dL
Hgb urine dipstick: NEGATIVE
Ketones, ur: NEGATIVE mg/dL
LEUKOCYTES UA: NEGATIVE
NITRITE: NEGATIVE
Protein, ur: NEGATIVE mg/dL
Specific Gravity, Urine: 1.015 (ref 1.005–1.030)
Urobilinogen, UA: 0.2 mg/dL (ref 0.0–1.0)
pH: 5.5 (ref 5.0–8.0)

## 2017-04-20 NOTE — Discharge Instructions (Signed)
I recommend you go to the ER for CT imaging to check you for a kidney stone.

## 2017-04-20 NOTE — ED Triage Notes (Addendum)
Left lower abdominal pain radiating around torso to left lower back.  Patient says she has a pulling sensation at navel.  Onset of pain Friday night.  Says urinae has been dark and says she cannot urinate at all today.  Last bm was yesterday.  Patient did take a laxative on Thursday with good results per patient.  Denies vaginal concerns  Last void was last night prior to going to bed at 9:00pm

## 2017-04-20 NOTE — ED Provider Notes (Signed)
[facility]    CSN: [id number] Arrival date & time: 04/20/17  1008     History   Chief Complaint Chief Complaint  Patient presents with  . Flank Pain    HPI Sheri Park is a 51 y.o. female.   Patient is a 51 yo F who presents to urgent care with L lower abdominal pain and flank pain. States L lower abdominal pain started on Friday night but acutely worsened early this morning with radiation to her L flank. Feels like a sharp "nagging" pain that is intermittent. Worse with standing/walking and sneezing. States urine has been dark since Tuesday and has not voided since last night thought she does feel the urge to urinate that no pee comes out. No fever/chills. No dysuria or frequency. No vaginal complaints.        Past Medical History:  Diagnosis Date  . Asthma   . Hypertension     Patient Active Problem List   Diagnosis Date Noted  . Acute coronary syndrome (HCC) 10/18/2016  . Chest pain on exertion 10/17/2016    Past Surgical History:  Procedure Laterality Date  . KNEE SURGERY    . LEFT HEART CATH AND CORONARY ANGIOGRAPHY N/A 10/20/2016   Procedure: Left Heart Cath and Coronary Angiography;  Surgeon: Orpah Cobb, MD;  Location: [facility];  Service: Cardiovascular;  Laterality: N/A;  . TUBAL LIGATION      OB History    No data available       Home Medications    Prior to Admission medications   Medication Sig Start Date End Date Taking? Authorizing Provider  cholecalciferol (VITAMIN D) 1000 units tablet Take 1,000 Units by mouth daily.   Yes [provider]  furosemide (LASIX) 40 MG tablet Take 40 mg by mouth. Takes on Monday, Wednesday, and friday   Yes [provider]  acetaminophen (TYLENOL) 325 MG tablet Take 650 mg by mouth every 6 (six) hours as needed for mild pain or moderate pain.    [provider]  albuterol (PROVENTIL HFA;VENTOLIN HFA) 108 (90 BASE) MCG/ACT inhaler Inhale 1-2 puffs into the  lungs every 6 (six) hours as needed for wheezing or shortness of breath.    [provider]  amLODipine (NORVASC) 5 MG tablet Take 1 tablet (5 mg total) by mouth daily. 04/07/16   [name], MD  aspirin EC 81 MG tablet Take 81 mg by mouth daily.    [provider]  cyclobenzaprine (FLEXERIL) 10 MG tablet Take 1 tablet (10 mg total) by mouth 2 (two) times daily as needed for muscle spasms. 12/12/16   Sharlene Dory, DO  ferrous sulfate 325 (65 FE) MG tablet Take 1 tablet (325 mg total) by mouth 2 (two) times daily with a meal. 10/21/16   Orpah Cobb, MD  fluticasone (FLOVENT HFA) 110 MCG/ACT inhaler Inhale 1 puff into the lungs 2 (two) times daily. 12/06/14   Linwood Dibbles, MD  gabapentin (NEURONTIN) 100 MG capsule Take 1 capsule (100 mg total) by mouth 2 (two) times daily. 10/21/16   Orpah Cobb, MD  hydrochlorothiazide (HYDRODIURIL) 12.5 MG tablet Take 12.5 mg by mouth daily.    [provider]  metoprolol succinate (TOPROL-XL) 25 MG 24 hr tablet Take 25 mg by mouth daily.    [provider]  naproxen (NAPROSYN) 500 MG tablet Take 1 tablet (500 mg total) by mouth 2 (two) times daily. 12/12/16   Sharlene Dory, DO  potassium chloride (K-DUR) 10  MEQ tablet Take 10 mEq by mouth daily.    [provider]  vitamin C (VITAMIN C) 500 MG tablet Take 1 tablet (500 mg total) by mouth 2 (two) times daily with a meal. 10/21/16   Orpah Cobb, MD    Family History Family History  Problem Relation Age of Onset  . Diabetes Mother   . Cancer Mother     Social History Social History  Substance Use Topics  . Smoking status: Never Smoker  . Smokeless tobacco: Never Used  . Alcohol use No     Allergies   Peanut-containing drug products; Codeine; and Ketorolac tromethamine   Review of Systems Review of Systems  Constitutional: Negative for chills and fever.  HENT: Negative for congestion.   Respiratory: Negative for shortness of breath.    Cardiovascular: Positive for leg swelling. Negative for chest pain.  Gastrointestinal: Positive for abdominal pain. Negative for constipation, diarrhea, nausea and vomiting.  Genitourinary: Positive for difficulty urinating and flank pain. Negative for dysuria, frequency, hematuria, urgency, vaginal discharge and vaginal pain.  Skin: Negative for rash.  Neurological: Negative for light-headedness and headaches.     Physical Exam Triage Vital Signs ED Triage Vitals  Enc Vitals Group     BP 04/20/17 1049 (!) 152/85     Pulse Rate 04/20/17 1049 87     Resp 04/20/17 1049 (!) 22     Temp 04/20/17 1049 97.9 F (36.6 C)     Temp Source 04/20/17 1049 Oral     SpO2 04/20/17 1049 100 %     Weight --      Height --      Head Circumference --      Peak Flow --      Pain Score 04/20/17 1043 8     Pain Loc --      Pain Edu? --      Excl. in GC? --    No data found.   Updated Vital Signs BP (!) 152/85 (BP Location: Right Arm) Comment: patient has not had blood pressure medicine Comment (BP Location): large cuff  Pulse 87   Temp 97.9 F (36.6 C) (Oral)   Resp (!) 22   LMP 04/13/2017   SpO2 100%   Physical Exam  Constitutional: She is oriented to person, place, and time. She appears well-developed and well-nourished. No distress.  HENT:  Head: Normocephalic and atraumatic.  Eyes: Conjunctivae and EOM are normal.  Neck: Normal range of motion.  Cardiovascular: Normal rate, regular rhythm and normal heart sounds.   No murmur heard. Pulmonary/Chest: Effort normal and breath sounds normal. No respiratory distress.  Abdominal: Soft. Bowel sounds are normal. She exhibits no distension. There is tenderness (LLQ). There is guarding. There is no rebound.  + L CVA tenderness  Musculoskeletal: Normal range of motion.  Neurological: She is alert and oriented to person, place, and time.  Skin: Skin is warm and dry. No rash noted.  Psychiatric: She has a normal mood and affect.     UC  Treatments / Results  Labs (all labs ordered are listed, but only abnormal results are displayed) Labs Reviewed  POCT URINALYSIS DIP (DEVICE)    EKG  EKG Interpretation None       Radiology No results found.  Procedures Procedures (including critical care time)  Medications Ordered in UC Medications - No data to display   Initial Impression / Assessment and Plan / UC Course  I have reviewed the triage vital signs and the nursing  notes.  Pertinent labs & imaging results that were available during my care of the patient were reviewed by me and considered in my medical decision making (see chart for details).   Patient is a 51 yo F who presented to urgent care with L lower abdominal pain and flank pain concerning for renal stone. Unlikely to be pyelonephritis given that patient is afebrile and although appears uncomfortable with abdominal exam is otherwise well appearning. Advised patient go to ER for further workup as I suspect she will need CT imaging. Patient stated that she preferred [facility] ED and would be heading directly there.  Final Clinical Impressions(s) / UC Diagnoses   Final diagnoses:  Left flank pain    New Prescriptions Discharge Medication List as of 04/20/2017 11:09 AM       Leland Her, DO 04/20/17 1122

## 2017-07-15 DIAGNOSIS — R072 Precordial pain: Secondary | ICD-10-CM | POA: Diagnosis not present

## 2017-07-15 DIAGNOSIS — R0602 Shortness of breath: Secondary | ICD-10-CM | POA: Diagnosis not present

## 2017-07-15 DIAGNOSIS — I10 Essential (primary) hypertension: Secondary | ICD-10-CM | POA: Diagnosis not present

## 2017-07-17 DIAGNOSIS — H52223 Regular astigmatism, bilateral: Secondary | ICD-10-CM | POA: Diagnosis not present

## 2017-08-19 DIAGNOSIS — R0602 Shortness of breath: Secondary | ICD-10-CM | POA: Diagnosis not present

## 2017-08-19 DIAGNOSIS — I10 Essential (primary) hypertension: Secondary | ICD-10-CM | POA: Diagnosis not present

## 2017-08-19 DIAGNOSIS — R072 Precordial pain: Secondary | ICD-10-CM | POA: Diagnosis not present

## 2017-11-17 ENCOUNTER — Other Ambulatory Visit: Payer: Self-pay | Admitting: Cardiovascular Disease

## 2017-11-17 ENCOUNTER — Ambulatory Visit
Admission: RE | Admit: 2017-11-17 | Discharge: 2017-11-17 | Disposition: A | Discharge status: Home / Self Care | Payer: Medicaid Other | Source: Ambulatory Visit | Attending: Cardiovascular Disease | Admitting: Cardiovascular Disease

## 2017-11-17 DIAGNOSIS — M5489 Other dorsalgia: Secondary | ICD-10-CM

## 2017-11-17 DIAGNOSIS — R200 Anesthesia of skin: Secondary | ICD-10-CM

## 2017-12-14 ENCOUNTER — Ambulatory Visit (HOSPITAL_COMMUNITY)
Admission: EM | Admit: 2017-12-14 | Discharge: 2017-12-14 | Disposition: A | Discharge status: Home / Self Care | Payer: Medicaid Other | Attending: Family Medicine | Admitting: Family Medicine

## 2017-12-14 ENCOUNTER — Encounter (HOSPITAL_COMMUNITY): Payer: Self-pay | Admitting: Emergency Medicine

## 2017-12-14 DIAGNOSIS — M545 Low back pain, unspecified: Secondary | ICD-10-CM

## 2017-12-14 DIAGNOSIS — L739 Follicular disorder, unspecified: Secondary | ICD-10-CM

## 2017-12-14 LAB — POCT URINALYSIS DIP (DEVICE)
BILIRUBIN URINE: NEGATIVE
GLUCOSE, UA: NEGATIVE mg/dL
Hgb urine dipstick: NEGATIVE
KETONES UR: NEGATIVE mg/dL
LEUKOCYTES UA: NEGATIVE
NITRITE: NEGATIVE
Protein, ur: 30 mg/dL — AB
Specific Gravity, Urine: 1.02 (ref 1.005–1.030)
Urobilinogen, UA: 0.2 mg/dL (ref 0.0–1.0)
pH: 6 (ref 5.0–8.0)

## 2017-12-14 LAB — POCT PREGNANCY, URINE: PREG TEST UR: NEGATIVE

## 2017-12-14 MED ORDER — CYCLOBENZAPRINE HCL 5 MG PO TABS: 5.0000 mg | ORAL_TABLET | Freq: Every evening | ORAL | 0 refills | Status: DC | PRN

## 2017-12-14 MED ORDER — MUPIROCIN 2 % EX OINT
1.0000 | TOPICAL_OINTMENT | Freq: Two times a day (BID) | CUTANEOUS | 0 refills | Status: DC
Start: 2017-12-14 — End: 2019-12-22

## 2017-12-14 MED ORDER — NYSTATIN 100000 UNIT/GM EX CREA: TOPICAL_CREAM | CUTANEOUS | 0 refills | Status: DC

## 2017-12-14 MED ORDER — POLYETHYLENE GLYCOL 3350 17 G PO PACK: 17.0000 g | PACK | Freq: Every day | ORAL | 0 refills | Status: DC

## 2017-12-14 NOTE — ED Triage Notes (Signed)
Pt here for left sided lower back pain; pt sts leg swelling increased per norm and a "knot in groin area"

## 2017-12-14 NOTE — Discharge Instructions (Signed)
Urine negative for pregnancy, UTI. Flexeril as needed at night. Flexeril can make you drowsy, so do not take if you are going to drive, operate heavy machinery, or make important decisions. Ice/heat compresses as needed. This can take up to 3-4 weeks to completely resolve, but you should be feeling better each week. Follow up here or with PCP if symptoms worsen, changes for reevaluation. If experience numbness/tingling of the inner thighs, loss of bladder or bowel control, go to the emergency department for evaluation.   MiraLAX to regulate your bowels, as bloating symptoms could be due to constipation.  Bactroban ointment on folliculitis of the lower abdomen.  Around her incision site, there may be a fungal infection, start nystatin cream as directed.   monitor for any worsening symptoms, follow-up for reevaluation needed.  If experiencing worsening abdominal pain, nausea, vomiting,  unwilling to jump up and down due to pain, go to the emergency department for further evaluation.

## 2017-12-14 NOTE — ED Provider Notes (Signed)
[facility]    CSN: [id number] Arrival date & time: 12/14/17  1045     History   Chief Complaint Chief Complaint  Patient presents with  . Back Pain    HPI Sheri Park is a 52 y.o. female.   52 year old female comes in with 2-week history of left-sided lower back pain and 30-month history of intermittent bloating and abdominal pain.  2-week history of left sided back pain that is intermittent.  States occasionally pain can radiate to the right.  Pain is worse with long hours of standing and moving.  She has increased her activity about 2 months ago trying to lose weight.  Denies urinary symptoms such as frequency, dysuria, hematuria.  Has tried hot baths, Epson salt soaks without relief.  Denies injury/trauma.  Denies saddle anesthesia, loss of bladder or bowel control.  States she has had 42-month history of intermittent abdominal pressure and bloating.  no obvious alleviating factor.  Has noticed that dairy products can cause bloating, so has been avoiding for the past week without relief.  Denies nausea, vomiting.  Does have constipation, states if she drinks iced coffee can cause a bowel movement, last bowel movement 2 days ago.  States she also has low abdominal pain that is tender to light touch around her C-section wound.  Denies fever, chills, night sweats.     Past Medical History:  Diagnosis Date  . Asthma   . Hypertension     Patient Active Problem List   Diagnosis Date Noted  . Acute coronary syndrome (HCC) 10/18/2016  . Chest pain on exertion 10/17/2016    Past Surgical History:  Procedure Laterality Date  . KNEE SURGERY    . LEFT HEART CATH AND CORONARY ANGIOGRAPHY N/A 10/20/2016   Procedure: Left Heart Cath and Coronary Angiography;  Surgeon: Orpah Cobb, MD;  Location: [facility];  Service: Cardiovascular;  Laterality: N/A;  . TUBAL LIGATION      OB History   None      Home Medications    Prior to Admission medications     Medication Sig Start Date End Date Taking? Authorizing Provider  acetaminophen (TYLENOL) 325 MG tablet Take 650 mg by mouth every 6 (six) hours as needed for mild pain or moderate pain.    [provider]  albuterol (PROVENTIL HFA;VENTOLIN HFA) 108 (90 BASE) MCG/ACT inhaler Inhale 1-2 puffs into the lungs every 6 (six) hours as needed for wheezing or shortness of breath.    [provider]  amLODipine (NORVASC) 5 MG tablet Take 1 tablet (5 mg total) by mouth daily. 04/07/16   [name], MD  aspirin EC 81 MG tablet Take 81 mg by mouth daily.    [provider]  cholecalciferol (VITAMIN D) 1000 units tablet Take 1,000 Units by mouth daily.    [provider]  cyclobenzaprine (FLEXERIL) 5 MG tablet Take 1 tablet (5 mg total) by mouth at bedtime as needed for muscle spasms. 12/14/17   Cathie Hoops, Jaque Dacy V, PA-C  ferrous sulfate 325 (65 FE) MG tablet Take 1 tablet (325 mg total) by mouth 2 (two) times daily with a meal. 10/21/16   Orpah Cobb, MD  fluticasone (FLOVENT HFA) 110 MCG/ACT inhaler Inhale 1 puff into the lungs 2 (two) times daily. 12/06/14   Linwood Dibbles, MD  furosemide (LASIX) 40 MG tablet Take 40 mg by mouth. Takes on Monday, Wednesday, and friday    [provider]  gabapentin (NEURONTIN) 100 MG capsule Take  1 capsule (100 mg total) by mouth 2 (two) times daily. 10/21/16   Orpah Cobb, MD  hydrochlorothiazide (HYDRODIURIL) 12.5 MG tablet Take 12.5 mg by mouth daily.    [provider]  metoprolol succinate (TOPROL-XL) 25 MG 24 hr tablet Take 25 mg by mouth daily.    [provider]  mupirocin ointment (BACTROBAN) 2 % Apply 1 application topically 2 (two) times daily. 12/14/17   Cathie Hoops, Makaya Juneau V, PA-C  nystatin cream (MYCOSTATIN) Apply to affected area 2 times daily 12/14/17   Cathie Hoops, Kamare Caspers V, PA-C  polyethylene glycol [facility]) packet Take 17 g by mouth daily. 12/14/17   Cathie Hoops, Genola Yuille V, PA-C  potassium chloride (K-DUR) 10 MEQ tablet Take 10 mEq by mouth  daily.    [provider]  vitamin C (VITAMIN C) 500 MG tablet Take 1 tablet (500 mg total) by mouth 2 (two) times daily with a meal. 10/21/16   Orpah Cobb, MD    Family History Family History  Problem Relation Age of Onset  . Diabetes Mother   . Cancer Mother     Social History Social History   Tobacco Use  . Smoking status: Never Smoker  . Smokeless tobacco: Never Used  Substance Use Topics  . Alcohol use: No  . Drug use: No     Allergies   Peanut-containing drug products; Codeine; and Ketorolac tromethamine   Review of Systems Review of Systems  Reason unable to perform ROS: See HPI as above.     Physical Exam Triage Vital Signs ED Triage Vitals [12/14/17 1140]  Enc Vitals Group     BP (!) 153/107     Pulse Rate 80     Resp 18     Temp 98.4 F (36.9 C)     Temp Source Oral     SpO2 98 %     Weight      Height      Head Circumference      Peak Flow      Pain Score      Pain Loc      Pain Edu?      Excl. in GC?    No data found.  Updated Vital Signs BP (!) 153/107 (BP Location: Left Arm)   Pulse 80   Temp 98.4 F (36.9 C) (Oral)   Resp 18   SpO2 98%   Physical Exam  Constitutional: She is oriented to person, place, and time. She appears well-developed and well-nourished. No distress.  HENT:  Head: Normocephalic and atraumatic.  Eyes: Pupils are equal, round, and reactive to light. Conjunctivae are normal.  Cardiovascular: Normal rate, regular rhythm and normal heart sounds. Exam reveals no gallop and no friction rub.  No murmur heard. Pulmonary/Chest: Effort normal and breath sounds normal. She has no wheezes. She has no rales.  Abdominal: Soft. Bowel sounds are normal. She exhibits no mass. There is no tenderness. There is no rebound, no guarding and no CVA tenderness.  Exam limited due to body habitus.  Folliculitis at the lower abdomen.  No surrounding erythema, increased warmth.  Dried powder around the C-section site, some  erythema noticed. No increased warmth. Tender to palpation  Musculoskeletal:  No tenderness on palpation of the spinous processes.  Tenderness to palpation of left lumbar paraspinal muscles. Full range of motion hip and back. Strength normal and equal bilaterally. Sensation intact and equal bilaterally.  Negative straight leg raise.    Neurological: She is alert and oriented to person, place, and  time.  Skin: Skin is warm and dry.  Psychiatric: She has a normal mood and affect. Her behavior is normal. Judgment normal.     UC Treatments / Results  Labs (all labs ordered are listed, but only abnormal results are displayed) Labs Reviewed  POCT URINALYSIS DIP (DEVICE) - Abnormal; Notable for the following components:      Result Value   Protein, ur 30 (*)    All other components within normal limits  POCT PREGNANCY, URINE    EKG None  Radiology No results found.  Procedures Procedures (including critical care time)  Medications Ordered in UC Medications - No data to display  Initial Impression / Assessment and Plan / UC Course  I have reviewed the triage vital signs and the nursing notes.  Pertinent labs & imaging results that were available during my care of the patient were reviewed by me and considered in my medical decision making (see chart for details).    Urine negative for UTI/pregnancy. Muscle relaxant as needed. Ice/heat compresses. Discussed with patient strain can take up to 3-4 weeks to resolve, but should be getting better each week. Return precautions given.   No alarming signs on abdominal exam. Patient complains of bloating with constipation. Will start miralax and have patient push fluids. Bactroban on folliculitis with warm compress. Exam limited due to powder to the location, however, discussed possible fungal infection at the skin folds given mild erythema without increased warmth. Will have patient apply nystatin as directed. Follow up with PCP for further  evaluation and management needed. Return precautions given.    Final Clinical Impressions(s) / UC Diagnoses   Final diagnoses:  Acute left-sided low back pain without sciatica  Folliculitis    ED Prescriptions    Medication Sig Dispense Auth. Provider   mupirocin ointment (BACTROBAN) 2 % Apply 1 application topically 2 (two) times daily. 22 g Jannice Beitzel V, PA-C   nystatin cream (MYCOSTATIN) Apply to affected area 2 times daily 15 g Miyana Mordecai V, PA-C   cyclobenzaprine (FLEXERIL) 5 MG tablet Take 1 tablet (5 mg total) by mouth at bedtime as needed for muscle spasms. 10 tablet Sabena Winner V, PA-C   polyethylene glycol (MIRALAX) packet Take 17 g by mouth daily. [address]14 each Threasa AlphaYu, Lore Polka V, PA-C        Buddy Loeffelholz V, PA-C 12/14/17 1250

## 2018-01-07 DIAGNOSIS — R0789 Other chest pain: Secondary | ICD-10-CM | POA: Diagnosis not present

## 2018-01-08 ENCOUNTER — Emergency Department (HOSPITAL_COMMUNITY)
Admission: EM | Admit: 2018-01-08 | Discharge: 2018-01-08 | Disposition: A | Discharge status: Home / Self Care | Payer: Medicaid Other | Attending: Emergency Medicine | Admitting: Emergency Medicine

## 2018-01-08 ENCOUNTER — Other Ambulatory Visit: Payer: Self-pay

## 2018-01-08 ENCOUNTER — Emergency Department (HOSPITAL_COMMUNITY): Payer: Medicaid Other

## 2018-01-08 ENCOUNTER — Encounter (HOSPITAL_COMMUNITY): Payer: Self-pay

## 2018-01-08 DIAGNOSIS — Z79899 Other long term (current) drug therapy: Secondary | ICD-10-CM | POA: Insufficient documentation

## 2018-01-08 DIAGNOSIS — I10 Essential (primary) hypertension: Secondary | ICD-10-CM | POA: Insufficient documentation

## 2018-01-08 DIAGNOSIS — J4521 Mild intermittent asthma with (acute) exacerbation: Secondary | ICD-10-CM | POA: Diagnosis not present

## 2018-01-08 DIAGNOSIS — R05 Cough: Secondary | ICD-10-CM | POA: Diagnosis not present

## 2018-01-08 DIAGNOSIS — R0602 Shortness of breath: Secondary | ICD-10-CM | POA: Diagnosis present

## 2018-01-08 LAB — CBC WITH DIFFERENTIAL/PLATELET
Basophils Absolute: 0 10*3/uL (ref 0.0–0.1)
Basophils Relative: 0 %
EOS ABS: 0.1 10*3/uL (ref 0.0–0.7)
Eosinophils Relative: 2 %
HCT: 34.6 % — ABNORMAL LOW (ref 36.0–46.0)
HEMOGLOBIN: 11.6 g/dL — AB (ref 12.0–15.0)
LYMPHS ABS: 2.4 10*3/uL (ref 0.7–4.0)
LYMPHS PCT: 38 %
MCH: 28.9 pg (ref 26.0–34.0)
MCHC: 33.5 g/dL (ref 30.0–36.0)
MCV: 86.3 fL (ref 78.0–100.0)
MONOS PCT: 7 %
Monocytes Absolute: 0.4 10*3/uL (ref 0.1–1.0)
NEUTROS PCT: 53 %
Neutro Abs: 3.3 10*3/uL (ref 1.7–7.7)
PLATELETS: 288 10*3/uL (ref 150–400)
RBC: 4.01 MIL/uL (ref 3.87–5.11)
RDW: 14.1 % (ref 11.5–15.5)
WBC: 6.2 10*3/uL (ref 4.0–10.5)

## 2018-01-08 LAB — BASIC METABOLIC PANEL
Anion gap: 9 (ref 5–15)
BUN: 10 mg/dL (ref 6–20)
CHLORIDE: 106 mmol/L (ref 98–111)
CO2: 27 mmol/L (ref 22–32)
CREATININE: 1.04 mg/dL — AB (ref 0.44–1.00)
Calcium: 9.4 mg/dL (ref 8.9–10.3)
GFR calc Af Amer: >60 mL/min (ref >60)
GFR calc non Af Amer: >60 mL/min (ref >60)
Glucose, Bld: 119 mg/dL — ABNORMAL HIGH (ref 70–99)
Potassium: 3.6 mmol/L (ref 3.5–5.1)
Sodium: 142 mmol/L (ref 135–145)

## 2018-01-08 LAB — I-STAT TROPONIN, ED: TROPONIN I, POC: 0 ng/mL (ref 0.00–0.08)

## 2018-01-08 MED ORDER — METHYLPREDNISOLONE SODIUM SUCC 125 MG IJ SOLR
125.0000 mg | Freq: Once | INTRAMUSCULAR | Status: AC
  Administered 2018-01-08: 125 mg via INTRAVENOUS
  Filled 2018-01-08: qty 2

## 2018-01-08 MED ORDER — ALBUTEROL SULFATE HFA 108 (90 BASE) MCG/ACT IN AERS
2.0000 | INHALATION_SPRAY | Freq: Once | RESPIRATORY_TRACT | Status: AC
  Administered 2018-01-08: 2 via RESPIRATORY_TRACT
  Filled 2018-01-08: qty 6.7

## 2018-01-08 MED ORDER — ALBUTEROL SULFATE (2.5 MG/3ML) 0.083% IN NEBU: 5.0000 mg | INHALATION_SOLUTION | Freq: Once | RESPIRATORY_TRACT | Status: DC

## 2018-01-08 MED ORDER — ALBUTEROL SULFATE (2.5 MG/3ML) 0.083% IN NEBU
5.0000 mg | INHALATION_SOLUTION | Freq: Once | RESPIRATORY_TRACT | Status: AC
  Administered 2018-01-08: 5 mg via RESPIRATORY_TRACT
  Filled 2018-01-08: qty 6

## 2018-01-08 MED ORDER — PREDNISONE 20 MG PO TABS: ORAL_TABLET | ORAL | 0 refills | Status: DC

## 2018-01-08 NOTE — ED Triage Notes (Signed)
Pt presents to ED from home for shortness of breath/asthma. Pt reports that she has felt SOB for 3 days. Pt reports that she went to urgent care, and they prescribed her pro-air inhaler. Pt reports that she is feeling worse.

## 2018-01-08 NOTE — Discharge Instructions (Signed)
Follow-up with your primary care doctor. I will talk with him about the metoprolol as this is a beta-blocker and can have some effect on your asthma. Return here for any new or worsening symptoms.

## 2018-01-08 NOTE — ED Provider Notes (Signed)
[facility]-EMERGENCY DEPT Provider Note   CSN: [id number] Arrival date & time: 01/08/18  0059     History   Chief Complaint Chief Complaint  Patient presents with  . Asthma  . Shortness of Breath    HPI Sheri Park is a 52 y.o. female.  The history is provided by the patient and medical records.  Asthma  Associated symptoms include shortness of breath.  Shortness of Breath  Associated symptoms include cough. Associated medical issues include asthma.    52 y.o. F with hx of asthma and HTN, presenting to the ED for SOB.  Patient reports she came down with a cold last week, but is starting to feel better.  States since then she has been having some issues with her breathing.  States she just does not feel that she can take a deep breath, especially during heavy bouts of coughing.  States cough is intermittently productive with yellow mucus.  She denies any fever or chills.  States she does have some tightness in her upper chest, worse with trying to take a deep breath.  States she does have a little bit of a "burning" sensation in her chest with deep breathing.  She was seen at urgent care and was given a pro-air inhaler.  She tried to use it last evening and states it actually made her feel worse so she decided to come in for evaluation.  Past Medical History:  Diagnosis Date  . Asthma   . Hypertension     Patient Active Problem List   Diagnosis Date Noted  . Acute coronary syndrome (HCC) 10/18/2016  . Chest pain on exertion 10/17/2016    Past Surgical History:  Procedure Laterality Date  . KNEE SURGERY    . LEFT HEART CATH AND CORONARY ANGIOGRAPHY N/A 10/20/2016   Procedure: Left Heart Cath and Coronary Angiography;  Surgeon: Orpah Cobb, MD;  Location: [facility];  Service: Cardiovascular;  Laterality: N/A;  . TUBAL LIGATION       OB History   None      Home Medications    Prior to Admission medications   Medication Sig  Start Date End Date Taking? Authorizing Provider  acetaminophen (TYLENOL) 325 MG tablet Take 650 mg by mouth every 6 (six) hours as needed for mild pain or moderate pain.    [provider]  albuterol (PROVENTIL HFA;VENTOLIN HFA) 108 (90 BASE) MCG/ACT inhaler Inhale 1-2 puffs into the lungs every 6 (six) hours as needed for wheezing or shortness of breath.    [provider]  amLODipine (NORVASC) 5 MG tablet Take 1 tablet (5 mg total) by mouth daily. 04/07/16   [name], MD  aspirin EC 81 MG tablet Take 81 mg by mouth daily.    [provider]  cholecalciferol (VITAMIN D) 1000 units tablet Take 1,000 Units by mouth daily.    [provider]  cyclobenzaprine (FLEXERIL) 5 MG tablet Take 1 tablet (5 mg total) by mouth at bedtime as needed for muscle spasms. 12/14/17   Cathie Hoops, Amy V, PA-C  ferrous sulfate 325 (65 FE) MG tablet Take 1 tablet (325 mg total) by mouth 2 (two) times daily with a meal. 10/21/16   Orpah Cobb, MD  fluticasone (FLOVENT HFA) 110 MCG/ACT inhaler Inhale 1 puff into the lungs 2 (two) times daily. 12/06/14   Linwood Dibbles, MD  furosemide (LASIX) 40 MG tablet Take 40 mg by mouth. Takes on Monday, Wednesday, and friday  [provider]  gabapentin (NEURONTIN) 100 MG capsule Take 1 capsule (100 mg total) by mouth 2 (two) times daily. 10/21/16   Orpah Cobb, MD  hydrochlorothiazide (HYDRODIURIL) 12.5 MG tablet Take 12.5 mg by mouth daily.    [provider]  metoprolol succinate (TOPROL-XL) 25 MG 24 hr tablet Take 25 mg by mouth daily.    [provider]  mupirocin ointment (BACTROBAN) 2 % Apply 1 application topically 2 (two) times daily. 12/14/17   Cathie Hoops, Amy V, PA-C  nystatin cream (MYCOSTATIN) Apply to affected area 2 times daily 12/14/17   Cathie Hoops, Amy V, PA-C  polyethylene glycol [facility]) packet Take 17 g by mouth daily. 12/14/17   Cathie Hoops, Amy V, PA-C  potassium chloride (K-DUR) 10 MEQ tablet Take 10 mEq by mouth daily.    [provider]  vitamin C (VITAMIN C) 500 MG tablet Take 1 tablet (500 mg total) by mouth 2 (two) times daily with a meal. 10/21/16   Orpah Cobb, MD    Family History Family History  Problem Relation Age of Onset  . Diabetes Mother   . Cancer Mother     Social History Social History   Tobacco Use  . Smoking status: Never Smoker  . Smokeless tobacco: Never Used  Substance Use Topics  . Alcohol use: No  . Drug use: No     Allergies   Peanut-containing drug products; Shellfish allergy; Codeine; and Ketorolac tromethamine   Review of Systems Review of Systems  Respiratory: Positive for cough, chest tightness and shortness of breath.   All other systems reviewed and are negative.    Physical Exam Updated Vital Signs BP (!) 156/103 (BP Location: Left Arm)   Pulse 83   Temp 98 F (36.7 C) (Oral)   Ht 5\' 5"  (1.651 m)   Wt (!) 145.2 kg (320 lb)   SpO2 100%   BMI 53.25 kg/m   Physical Exam  Constitutional: She is oriented to person, place, and time. She appears well-developed and well-nourished.  Obese  HENT:  Head: Normocephalic and atraumatic.  Mouth/Throat: Oropharynx is clear and moist.  HEENT exam WNL; no thrush or oral lesions  Eyes: Pupils are equal, round, and reactive to light. Conjunctivae and EOM are normal.  Neck: Normal range of motion.  Cardiovascular: Normal rate, regular rhythm and normal heart sounds.  Pulmonary/Chest: Effort normal. She has decreased breath sounds. She has no wheezes. She has no rhonchi. She has no rales.  Repetitive dry cough, breath sounds are diminished without any noted wheezes or rhonchi  Abdominal: Soft. Bowel sounds are normal.  Musculoskeletal: Normal range of motion.  Neurological: She is alert and oriented to person, place, and time.  Skin: Skin is warm and dry.  Psychiatric: She has a normal mood and affect.  Nursing note and vitals reviewed.    ED Treatments / Results  Labs (all labs ordered are listed, but  only abnormal results are displayed) Labs Reviewed  CBC WITH DIFFERENTIAL/PLATELET - Abnormal; Notable for the following components:      Result Value   Hemoglobin 11.6 (*)    HCT 34.6 (*)    All other components within normal limits  BASIC METABOLIC PANEL - Abnormal; Notable for the following components:   Glucose, Bld 119 (*)    Creatinine, Ser 1.04 (*)    All other components within normal limits  I-STAT TROPONIN, ED    EKG EKG Interpretation  Date/Time:  Friday January 08 2018 02:00:47 EDT Ventricular Rate:  71 PR Interval:    QRS Duration: 89 QT Interval:  409 QTC Calculation: 445 R Axis:   17 Text Interpretation:  Sinus rhythm Low voltage, precordial leads No acute changes Nonspecific ST and T wave abnormality Confirmed by Derwood Kaplananavati, Ankit [phone]) on 01/08/2018 3:25:45 AM   Radiology Dg Chest 2 View  Result Date: 01/08/2018 CLINICAL DATA:  52 year old female with shortness of breath. EXAM: CHEST - 2 VIEW COMPARISON:  Chest radiograph dated 12/10/2014 FINDINGS: The heart size and mediastinal contours are within normal limits. Both lungs are clear. The visualized skeletal structures are unremarkable. IMPRESSION: No active cardiopulmonary disease. Electronically Signed   By: Elgie CollardArash  Radparvar M.D.   On: 01/08/2018 01:48    Procedures Procedures (including critical care time)  Medications Ordered in ED Medications  albuterol (PROVENTIL HFA;VENTOLIN HFA) 108 (90 Base) MCG/ACT inhaler 2 puff (has no administration in time range)  methylPREDNISolone sodium succinate (SOLU-MEDROL) 125 mg/2 mL injection 125 mg (125 mg Intravenous Given 01/08/18 0149)  albuterol (PROVENTIL) (2.5 MG/3ML) 0.083% nebulizer solution 5 mg (5 mg Nebulization Given 01/08/18 0149)     Initial Impression / Assessment and Plan / ED Course  I have reviewed the triage vital signs and the nursing notes.  Pertinent labs & imaging results that were available during my care of the patient were reviewed by me and  considered in my medical decision making (see chart for details).  52 year old female presenting to the ED with shortness of breath.  Has had recent URI with some issues with her asthma.  Seen at urgent care and given pro-air inhaler but states it actually made her feel worse.  She is afebrile and nontoxic.  She does have repetitive dry cough during exam but no audible wheezes or rhonchi.  She does not appear fluid overloaded.  We will plan for labs, chest x-ray.  Given IV solu-medrol, neb treatment.  3:11 AM Patient feeling better after neb treatment here.  Labs and CXR reviewed, no acute findings.  Patient is on beta-blocker, metoprolol.  States she has been on this for about a year.  She does admit to some worsening issues with her asthma over the past year, even outside of URI symptoms.  I recommended that she discuss this with her primary care doctor given incidence of potential cross-reactivity.  We will switch her inhaler to Ventolin which she has tolerated well in the past, short steroid taper.  Discussed plan with patient, she acknowledged understanding and agreed with plan of care.  Return precautions given for new or worsening symptoms.  Final Clinical Impressions(s) / ED Diagnoses   Final diagnoses:  Mild intermittent asthma with exacerbation    ED Discharge Orders        Ordered    predniSONE (DELTASONE) 20 MG tablet     01/08/18 0326       Garlon HatchetSanders, Jakie Debow M, PA-C 01/08/18 [id number]    Derwood KaplanNanavati, Ankit, MD 01/08/18 [phone]

## 2018-03-31 DIAGNOSIS — R0602 Shortness of breath: Secondary | ICD-10-CM | POA: Diagnosis not present

## 2018-03-31 DIAGNOSIS — I10 Essential (primary) hypertension: Secondary | ICD-10-CM | POA: Diagnosis not present

## 2018-03-31 DIAGNOSIS — R072 Precordial pain: Secondary | ICD-10-CM | POA: Diagnosis not present

## 2018-06-16 ENCOUNTER — Other Ambulatory Visit: Payer: Self-pay

## 2018-06-16 ENCOUNTER — Encounter (HOSPITAL_COMMUNITY): Payer: Self-pay | Admitting: Emergency Medicine

## 2018-06-16 ENCOUNTER — Ambulatory Visit (HOSPITAL_COMMUNITY)
Admission: EM | Admit: 2018-06-16 | Discharge: 2018-06-16 | Disposition: A | Discharge status: Home / Self Care | Payer: Medicaid Other | Attending: Internal Medicine | Admitting: Internal Medicine

## 2018-06-16 DIAGNOSIS — R6889 Other general symptoms and signs: Secondary | ICD-10-CM | POA: Diagnosis not present

## 2018-06-16 DIAGNOSIS — K529 Noninfective gastroenteritis and colitis, unspecified: Secondary | ICD-10-CM | POA: Insufficient documentation

## 2018-06-16 DIAGNOSIS — K5289 Other specified noninfective gastroenteritis and colitis: Secondary | ICD-10-CM

## 2018-06-16 DIAGNOSIS — Z885 Allergy status to narcotic agent status: Secondary | ICD-10-CM | POA: Insufficient documentation

## 2018-06-16 DIAGNOSIS — J029 Acute pharyngitis, unspecified: Secondary | ICD-10-CM | POA: Diagnosis not present

## 2018-06-16 DIAGNOSIS — I10 Essential (primary) hypertension: Secondary | ICD-10-CM | POA: Insufficient documentation

## 2018-06-16 DIAGNOSIS — J45909 Unspecified asthma, uncomplicated: Secondary | ICD-10-CM | POA: Diagnosis not present

## 2018-06-16 DIAGNOSIS — Z79899 Other long term (current) drug therapy: Secondary | ICD-10-CM | POA: Insufficient documentation

## 2018-06-16 DIAGNOSIS — Z7982 Long term (current) use of aspirin: Secondary | ICD-10-CM | POA: Insufficient documentation

## 2018-06-16 LAB — POCT RAPID STREP A: Streptococcus, Group A Screen (Direct): NEGATIVE

## 2018-06-16 MED ORDER — ONDANSETRON 4 MG PO TBDP
ORAL_TABLET | ORAL | Status: AC
  Filled 2018-06-16: qty 2

## 2018-06-16 MED ORDER — OSELTAMIVIR PHOSPHATE 75 MG PO CAPS: 75.0000 mg | ORAL_CAPSULE | Freq: Two times a day (BID) | ORAL | 0 refills | Status: DC

## 2018-06-16 MED ORDER — ACETAMINOPHEN 325 MG PO TABS
650.0000 mg | ORAL_TABLET | Freq: Once | ORAL | Status: AC
  Administered 2018-06-16: 650 mg via ORAL

## 2018-06-16 MED ORDER — ACETAMINOPHEN 325 MG PO TABS
ORAL_TABLET | ORAL | Status: AC
Start: 2018-06-16 — End: ?
  Filled 2018-06-16: qty 2

## 2018-06-16 MED ORDER — ACETAMINOPHEN 160 MG/5ML PO SUSP
ORAL | Status: AC
Start: 2018-06-16 — End: ?
  Filled 2018-06-16: qty 15

## 2018-06-16 MED ORDER — PROMETHAZINE HCL 25 MG PO TABS: 25.0000 mg | ORAL_TABLET | Freq: Four times a day (QID) | ORAL | 0 refills | Status: DC | PRN

## 2018-06-16 MED ORDER — ONDANSETRON 4 MG PO TBDP
4.0000 mg | ORAL_TABLET | Freq: Once | ORAL | Status: AC
  Administered 2018-06-16: 4 mg via ORAL

## 2018-06-16 NOTE — ED Triage Notes (Signed)
Pt states that her family that has been staying with her have all been diagnosed with the flu.  Two of the kids went to the hospital with febrile seizures.  Pt complains of cough and sore throat and headache.

## 2018-06-16 NOTE — Discharge Instructions (Addendum)
Stay on BRAT diet for 48- 72 hours You may use Cepestat for your sore throat.  Push a lot of fluids to prevent dehydration.  Follow up with your family Dr if you get worse in 48-72h.  It is OK to take Pepto Bismal for diarrhea.

## 2018-06-16 NOTE — ED Provider Notes (Signed)
[facility]    CSN: [id number] Arrival date & time: 06/16/18  1755     History   Chief Complaint Chief Complaint  Patient presents with  . Cough  . Sore Throat    HPI Sheri Park is a 52 y.o. female.   Who present with ST, body aches which started 2 days ago. Yesterday started nausea, and HA. Has not had a fever yet. Has been having diarrhea and vomiting since 4 am today. Vomited x 2 today, diarrhea x 5. No blood in the stool. She also has low back aching. Denies UTI symptoms or URI symptoms. Her kids at home have tested positive for flu. Her son with her today has fever, body aches and GI symptoms as well. She has a HA right now. Has not taken any of her meds or anything for pain. No out of the country travel.      Past Medical History:  Diagnosis Date  . Asthma   . Hypertension     Patient Active Problem List   Diagnosis Date Noted  . Acute coronary syndrome (HCC) 10/18/2016  . Chest pain on exertion 10/17/2016    Past Surgical History:  Procedure Laterality Date  . KNEE SURGERY    . LEFT HEART CATH AND CORONARY ANGIOGRAPHY N/A 10/20/2016   Procedure: Left Heart Cath and Coronary Angiography;  Surgeon: Orpah Cobb, MD;  Location: [facility];  Service: Cardiovascular;  Laterality: N/A;  . TUBAL LIGATION      OB History   None      Home Medications    Prior to Admission medications   Medication Sig Start Date End Date Taking? Authorizing Provider  albuterol (PROVENTIL HFA;VENTOLIN HFA) 108 (90 BASE) MCG/ACT inhaler Inhale 1-2 puffs into the lungs every 6 (six) hours as needed for wheezing or shortness of breath.   Yes [provider]  amLODipine (NORVASC) 5 MG tablet Take 1 tablet (5 mg total) by mouth daily. 04/07/16  Yes [name], MD  cholecalciferol (VITAMIN D) 1000 units tablet Take 1,000 Units by mouth daily.   Yes [provider]  ferrous sulfate 325 (65 FE) MG tablet Take 1 tablet (325 mg total) by  mouth 2 (two) times daily with a meal. 10/21/16  Yes Orpah Cobb, MD  furosemide (LASIX) 40 MG tablet Take 40 mg by mouth. Takes on Monday, Wednesday, and friday   Yes [provider]  hydrochlorothiazide (HYDRODIURIL) 12.5 MG tablet Take 12.5 mg by mouth daily.   Yes [provider]  potassium chloride (K-DUR) 10 MEQ tablet Take 10 mEq by mouth daily.   Yes [provider]  vitamin C (VITAMIN C) 500 MG tablet Take 1 tablet (500 mg total) by mouth 2 (two) times daily with a meal. 10/21/16  Yes Orpah Cobb, MD  acetaminophen (TYLENOL) 325 MG tablet Take 650 mg by mouth every 6 (six) hours as needed for mild pain or moderate pain.    [provider]  aspirin EC 81 MG tablet Take 81 mg by mouth daily.    [provider]  fluticasone (FLOVENT HFA) 110 MCG/ACT inhaler Inhale 1 puff into the lungs 2 (two) times daily. 12/06/14   Linwood Dibbles, MD  mupirocin ointment (BACTROBAN) 2 % Apply 1 application topically 2 (two) times daily. 12/14/17   Cathie Hoops, Amy V, PA-C  nystatin cream (MYCOSTATIN) Apply to affected area 2 times daily 12/14/17   Belinda Fisher, PA-C  oseltamivir (TAMIFLU) 75 MG capsule Take 1 capsule (  75 mg total) by mouth every 12 (twelve) hours. 06/16/18   Rodriguez-Southworth, Nettie ElmSylvia, PA-C  polyethylene glycol (MIRALAX) packet Take 17 g by mouth daily. 12/14/17   Cathie HoopsYu, Amy V, PA-C  promethazine (PHENERGAN) 25 MG tablet Take 1 tablet (25 mg total) by mouth every 6 (six) hours as needed for nausea or vomiting. 06/16/18   Rodriguez-Southworth, Nettie ElmSylvia, PA-C    Family History Family History  Problem Relation Age of Onset  . Diabetes Mother   . Cancer Mother     Social History Social History   Tobacco Use  . Smoking status: Never Smoker  . Smokeless tobacco: Never Used  Substance Use Topics  . Alcohol use: No  . Drug use: No     Allergies   Peanut-containing drug products; Shellfish allergy; Codeine; and Ketorolac tromethamine   Review of Systems Review  of Systems  Constitutional: Positive for appetite change, chills and fatigue. Negative for fever.  HENT: Positive for sore throat. Negative for postnasal drip, rhinorrhea and trouble swallowing.   Eyes: Negative for discharge.  Respiratory: Positive for cough. Negative for shortness of breath and wheezing.        Chest is sore from coughing.   Cardiovascular: Negative for chest pain.       No new chest pain for her, just the usual for which her cardiologist is monitoring her  Gastrointestinal: Positive for diarrhea, nausea and vomiting. Negative for blood in stool.  Genitourinary: Negative for dysuria.  Musculoskeletal: Positive for back pain and myalgias.  Skin: Negative for rash.  Neurological: Positive for headaches.     Physical Exam Triage Vital Signs ED Triage Vitals [06/16/18 1912]  Enc Vitals Group     BP 135/84     Pulse Rate 80     Resp      Temp 98.3 F (36.8 C)     Temp Source Oral     SpO2 98 %     Weight      Height      Head Circumference      Peak Flow      Pain Score 8     Pain Loc      Pain Edu?      Excl. in GC?    No data found.  Updated Vital Signs BP 135/84 (BP Location: Right Arm)   Pulse 80   Temp 98.3 F (36.8 C) (Oral)   LMP 06/02/2018 (Approximate)   SpO2 98%   Visual Acuity Right Eye Distance:   Left Eye Distance:   Bilateral Distance:    Right Eye Near:   Left Eye Near:    Bilateral Near:     Physical Exam  Constitutional: She is oriented to person, place, and time. She appears well-developed and well-nourished.  Non-toxic appearance. She appears ill. No distress.  HENT:  Head: Normocephalic.  Right Ear: Hearing, tympanic membrane and ear canal normal. No drainage or swelling.  Left Ear: Hearing, tympanic membrane and ear canal normal. No drainage or swelling.  Mouth/Throat: Uvula is midline and mucous membranes are normal. No oral lesions. No uvula swelling. Posterior oropharyngeal erythema present. No oropharyngeal exudate,  posterior oropharyngeal edema or tonsillar abscesses. Tonsils are 0 on the right. Tonsils are 0 on the left. No tonsillar exudate.  Her R ear canal was painful with exam, but is not red or swollen.   Neck: Neck supple.  Cardiovascular: Normal rate and normal heart sounds.  No murmur heard. Pulmonary/Chest: Effort normal and breath sounds normal. No respiratory  distress. She has no wheezes. She has no rhonchi. She has no rales.  Negative egophony  Abdominal: Soft. She exhibits no distension and no mass. There is no tenderness. There is no rebound and no guarding.  Lymphadenopathy:    She has no cervical adenopathy.  Neurological: She is alert and oriented to person, place, and time.  Skin: Skin is warm and dry. No rash noted.  Psychiatric: She has a normal mood and affect. Her behavior is normal.  Nursing note and vitals reviewed.    UC Treatments / Results  Labs (all labs ordered are listed, but only abnormal results are displayed) Labs Reviewed  CULTURE, GROUP A STREP [facility])  POCT RAPID STREP A   Rapid strep was neg.   EKG None  Radiology No results found. Medications Ordered in UC Medications  ondansetron (ZOFRAN-ODT) disintegrating tablet 4 mg (4 mg Oral Given 06/16/18 1956)  acetaminophen (TYLENOL) tablet 650 mg (650 mg Oral Given 06/16/18 1956)    Initial Impression / Assessment and Plan / UC Course  I have reviewed the triage vital signs and the nursing notes. Pertinent labs results that were available during my care of the patient were reviewed by me and considered in my medical decision making (see chart for details). I explained to her she has viral GE and I am concerned she is getting influenza. I placed her on Phenergan and Tamiflu as noted.  Needs to monitor her BP and if 100/60 needs to skip her Amlodipine. Needs to push fluids and stay on BRAT diet for 48h.    Final Clinical Impressions(s) / UC Diagnoses   Final diagnoses:  Other noninfectious  gastroenteritis  Flu-like symptoms     Discharge Instructions     Stay on BRAT diet for 48- 72 hours You may use Cepestat for your sore throat.  Push a lot of fluids to prevent dehydration.  Follow up with your family Dr if you get worse in 48-72h.  It is OK to take Pepto Bismal for diarrhea.     ED Prescriptions    Medication Sig Dispense Auth. Provider   oseltamivir (TAMIFLU) 75 MG capsule Take 1 capsule (75 mg total) by mouth every 12 (twelve) hours. 10 capsule Rodriguez-Southworth, Nettie Elm, PA-C   promethazine (PHENERGAN) 25 MG tablet Take 1 tablet (25 mg total) by mouth every 6 (six) hours as needed for nausea or vomiting. 30 tablet Rodriguez-Southworth, Nettie Elm, PA-C     Controlled Substance Prescriptions  Controlled Substance Registry consulted?    Garey Ham, Cordelia Poche 06/16/18 2132

## 2018-06-19 LAB — CULTURE, GROUP A STREP (THRC)

## 2018-06-30 ENCOUNTER — Encounter (HOSPITAL_COMMUNITY): Payer: Self-pay | Admitting: *Deleted

## 2018-06-30 ENCOUNTER — Other Ambulatory Visit: Payer: Self-pay

## 2018-06-30 ENCOUNTER — Emergency Department (HOSPITAL_COMMUNITY)
Admission: EM | Admit: 2018-06-30 | Discharge: 2018-06-30 | Disposition: A | Discharge status: Home / Self Care | Payer: Medicaid Other | Attending: Emergency Medicine | Admitting: Emergency Medicine

## 2018-06-30 DIAGNOSIS — Z7982 Long term (current) use of aspirin: Secondary | ICD-10-CM | POA: Insufficient documentation

## 2018-06-30 DIAGNOSIS — J029 Acute pharyngitis, unspecified: Secondary | ICD-10-CM | POA: Diagnosis present

## 2018-06-30 DIAGNOSIS — J45909 Unspecified asthma, uncomplicated: Secondary | ICD-10-CM | POA: Insufficient documentation

## 2018-06-30 DIAGNOSIS — I10 Essential (primary) hypertension: Secondary | ICD-10-CM | POA: Diagnosis not present

## 2018-06-30 DIAGNOSIS — J020 Streptococcal pharyngitis: Secondary | ICD-10-CM | POA: Diagnosis not present

## 2018-06-30 DIAGNOSIS — Z79899 Other long term (current) drug therapy: Secondary | ICD-10-CM | POA: Insufficient documentation

## 2018-06-30 LAB — GROUP A STREP BY PCR: GROUP A STREP BY PCR: DETECTED — AB

## 2018-06-30 MED ORDER — DEXAMETHASONE 4 MG PO TABS
10.0000 mg | ORAL_TABLET | Freq: Once | ORAL | Status: AC
  Administered 2018-06-30: 10 mg via ORAL
  Filled 2018-06-30: qty 2

## 2018-06-30 MED ORDER — PENICILLIN G BENZATHINE 1200000 UNIT/2ML IM SUSP
1.2000 10*6.[IU] | Freq: Once | INTRAMUSCULAR | Status: AC
  Administered 2018-06-30: 1.2 10*6.[IU] via INTRAMUSCULAR
  Filled 2018-06-30: qty 2

## 2018-06-30 NOTE — Discharge Instructions (Addendum)
Follow up with your primary care doctor as needed for persistent symptoms. REturn here as needed.

## 2018-06-30 NOTE — ED Triage Notes (Signed)
Pt reports sore throat since Sunday. She says that she had the FLU and PNA last week. Has been using cough drops, chloraseptic spray and drinking warm tea without relief.

## 2018-06-30 NOTE — ED Notes (Signed)
Pt and family verbalized discharge instructions and follow up care. Family is driving her home

## 2018-06-30 NOTE — ED Provider Notes (Signed)
[facility]-EMERGENCY DEPT Provider Note   CSN: [id number] Arrival date & time: 06/30/18  0219     History   Chief Complaint Chief Complaint  Patient presents with  . Sore Throat    HPI Sheri Park is a 52 y.o. female.  Patient to ED with sore throat, throat swelling, congestion and cough that has been persistent since being seen at Urgent Care on 12/4. She was given Tamiflu at that time and completed this medication. She states her sore throat became significantly worse 3 days ago. She is able to swallow. No vomiting or rash. She has tried warm fluids, Tylenol, throat spray without improvement.   The history is provided by the patient. No language interpreter was used.  Sore Throat     Past Medical History:  Diagnosis Date  . Asthma   . Hypertension     Patient Active Problem List   Diagnosis Date Noted  . Acute coronary syndrome (HCC) 10/18/2016  . Chest pain on exertion 10/17/2016    Past Surgical History:  Procedure Laterality Date  . KNEE SURGERY    . LEFT HEART CATH AND CORONARY ANGIOGRAPHY N/A 10/20/2016   Procedure: Left Heart Cath and Coronary Angiography;  Surgeon: Orpah CobbAjay Kadakia, MD;  Location: [facility] INVASIVE CV LAB;  Service: Cardiovascular;  Laterality: N/A;  . TUBAL LIGATION       OB History   No obstetric history on file.      Home Medications    Prior to Admission medications   Medication Sig Start Date End Date Taking? Authorizing Provider  acetaminophen (TYLENOL) 325 MG tablet Take 650 mg by mouth every 6 (six) hours as needed for mild pain or moderate pain.    [provider]  albuterol (PROVENTIL HFA;VENTOLIN HFA) 108 (90 BASE) MCG/ACT inhaler Inhale 1-2 puffs into the lungs every 6 (six) hours as needed for wheezing or shortness of breath.    [provider]  amLODipine (NORVASC) 5 MG tablet Take 1 tablet (5 mg total) by mouth daily. 04/07/16   Elvina SidleLauenstein, Kurt, MD  aspirin EC 81 MG tablet Take 81  mg by mouth daily.    [provider]  cholecalciferol (VITAMIN D) 1000 units tablet Take 1,000 Units by mouth daily.    [provider]  ferrous sulfate 325 (65 FE) MG tablet Take 1 tablet (325 mg total) by mouth 2 (two) times daily with a meal. 10/21/16   Orpah CobbKadakia, Ajay, MD  fluticasone (FLOVENT HFA) 110 MCG/ACT inhaler Inhale 1 puff into the lungs 2 (two) times daily. 12/06/14   Linwood DibblesKnapp, Jon, MD  furosemide (LASIX) 40 MG tablet Take 40 mg by mouth. Takes on Monday, Wednesday, and friday    [provider]  hydrochlorothiazide (HYDRODIURIL) 12.5 MG tablet Take 12.5 mg by mouth daily.    [provider]  mupirocin ointment (BACTROBAN) 2 % Apply 1 application topically 2 (two) times daily. 12/14/17   Cathie HoopsYu, Amy V, PA-C  nystatin cream (MYCOSTATIN) Apply to affected area 2 times daily 12/14/17   Belinda FisherYu, Amy V, PA-C  oseltamivir (TAMIFLU) 75 MG capsule Take 1 capsule (75 mg total) by mouth every 12 (twelve) hours. 06/16/18   Rodriguez-Southworth, Nettie ElmSylvia, PA-C  polyethylene glycol (MIRALAX) packet Take 17 g by mouth daily. 12/14/17   Cathie HoopsYu, Amy V, PA-C  potassium chloride (K-DUR) 10 MEQ tablet Take 10 mEq by mouth daily.    [provider]  promethazine (PHENERGAN) 25 MG tablet Take 1 tablet (25 mg total) by mouth  every 6 (six) hours as needed for nausea or vomiting. 06/16/18   Rodriguez-Southworth, Nettie Elm, PA-C  vitamin C (VITAMIN C) 500 MG tablet Take 1 tablet (500 mg total) by mouth 2 (two) times daily with a meal. 10/21/16   Orpah Cobb, MD    Family History Family History  Problem Relation Age of Onset  . Diabetes Mother   . Cancer Mother     Social History Social History   Tobacco Use  . Smoking status: Never Smoker  . Smokeless tobacco: Never Used  Substance Use Topics  . Alcohol use: No  . Drug use: No     Allergies   Peanut-containing drug products; Shellfish allergy; Codeine; and Ketorolac tromethamine   Review of Systems Review of Systems    Constitutional: Positive for fever.  HENT: Positive for congestion and sore throat.   Respiratory: Positive for cough.   Cardiovascular: Negative.   Gastrointestinal: Negative.  Negative for nausea.  Musculoskeletal: Negative.   Skin: Negative.  Negative for rash.  Neurological: Negative.      Physical Exam Updated Vital Signs BP (!) 149/74 (BP Location: Right Arm)   Pulse 82   Temp 98.2 F (36.8 C) (Oral)   Resp 16   LMP 06/02/2018 (Approximate)   SpO2 99%   Physical Exam Constitutional:      Appearance: She is well-developed.  HENT:     Head: Normocephalic.     Mouth/Throat:     Mouth: Mucous membranes are moist.     Pharynx: Uvula midline. Pharyngeal swelling and posterior oropharyngeal erythema present. No oropharyngeal exudate.     Tonsils: No tonsillar exudate.  Neck:     Musculoskeletal: Normal range of motion and neck supple.  Cardiovascular:     Rate and Rhythm: Normal rate and regular rhythm.     Heart sounds: No murmur.  Pulmonary:     Effort: Pulmonary effort is normal.     Breath sounds: Normal breath sounds. No wheezing, rhonchi or rales.  Abdominal:     General: Bowel sounds are normal.     Palpations: Abdomen is soft.     Tenderness: There is no abdominal tenderness. There is no guarding or rebound.  Musculoskeletal: Normal range of motion.  Skin:    General: Skin is warm and dry.     Findings: No rash.  Neurological:     Mental Status: She is alert.     Cranial Nerves: No cranial nerve deficit.      ED Treatments / Results  Labs (all labs ordered are listed, but only abnormal results are displayed) Labs Reviewed  GROUP A STREP BY PCR - Abnormal; Notable for the following components:      Result Value   Group A Strep by PCR DETECTED (*)    All other components within normal limits    EKG None  Radiology No results found.  Procedures Procedures (including critical care time)  Medications Ordered in ED Medications  penicillin g  benzathine (BICILLIN LA) 1200000 UNIT/2ML injection 1.2 Million Units (has no administration in time range)  dexamethasone (DECADRON) tablet 10 mg (has no administration in time range)     Initial Impression / Assessment and Plan / ED Course  I have reviewed the triage vital signs and the nursing notes.  Pertinent labs & imaging results that were available during my care of the patient were reviewed by me and considered in my medical decision making (see chart for details).     Patient to ED with ST,  congestion, cough for 2 weeks, worse over the last 3 days.   Strep test is positive in the ED. She is given IM bicillin LA. Also dosed with Decadron for swelling. No history diabetes. Encouraged to f/u with PCP as needed. Continue other supportive measures.   Final Clinical Impressions(s) / ED Diagnoses   Final diagnoses:  None   1. Strep throat  ED Discharge Orders    None       Elpidio Anis, PA-C 06/30/18 0417    [name], MD 06/30/18 [phone]

## 2018-08-14 ENCOUNTER — Emergency Department (HOSPITAL_COMMUNITY): Payer: Medicaid Other

## 2018-08-14 ENCOUNTER — Emergency Department (HOSPITAL_COMMUNITY)
Admission: EM | Admit: 2018-08-14 | Discharge: 2018-08-14 | Disposition: A | Discharge status: Home / Self Care | Payer: Medicaid Other | Attending: Emergency Medicine | Admitting: Emergency Medicine

## 2018-08-14 ENCOUNTER — Encounter (HOSPITAL_COMMUNITY): Payer: Self-pay | Admitting: *Deleted

## 2018-08-14 ENCOUNTER — Other Ambulatory Visit: Payer: Self-pay

## 2018-08-14 DIAGNOSIS — Z79899 Other long term (current) drug therapy: Secondary | ICD-10-CM | POA: Insufficient documentation

## 2018-08-14 DIAGNOSIS — R509 Fever, unspecified: Secondary | ICD-10-CM | POA: Diagnosis not present

## 2018-08-14 DIAGNOSIS — I10 Essential (primary) hypertension: Secondary | ICD-10-CM | POA: Diagnosis not present

## 2018-08-14 DIAGNOSIS — J45909 Unspecified asthma, uncomplicated: Secondary | ICD-10-CM | POA: Diagnosis not present

## 2018-08-14 DIAGNOSIS — Z91010 Allergy to peanuts: Secondary | ICD-10-CM | POA: Diagnosis not present

## 2018-08-14 DIAGNOSIS — Z7982 Long term (current) use of aspirin: Secondary | ICD-10-CM | POA: Diagnosis not present

## 2018-08-14 DIAGNOSIS — R05 Cough: Secondary | ICD-10-CM | POA: Insufficient documentation

## 2018-08-14 DIAGNOSIS — R059 Cough, unspecified: Secondary | ICD-10-CM

## 2018-08-14 MED ORDER — ONDANSETRON 8 MG PO TBDP: 8.0000 mg | ORAL_TABLET | Freq: Three times a day (TID) | ORAL | 0 refills | Status: DC | PRN

## 2018-08-14 MED ORDER — HYDROCOD POLST-CPM POLST ER 10-8 MG/5ML PO SUER
5.0000 mL | Freq: Once | ORAL | Status: AC
  Administered 2018-08-14: 5 mL via ORAL
  Filled 2018-08-14: qty 5

## 2018-08-14 MED ORDER — HYDROCOD POLST-CPM POLST ER 10-8 MG/5ML PO SUER: 5.0000 mL | Freq: Two times a day (BID) | ORAL | 0 refills | Status: DC | PRN

## 2018-08-14 MED ORDER — ONDANSETRON 8 MG PO TBDP
8.0000 mg | ORAL_TABLET | Freq: Once | ORAL | Status: AC
  Administered 2018-08-14: 8 mg via ORAL
  Filled 2018-08-14: qty 1

## 2018-08-14 NOTE — ED Provider Notes (Signed)
[facility]-EMERGENCY DEPT Provider Note   CSN: [id number] Arrival date & time: 08/14/18  2149     History   Chief Complaint No chief complaint on file.   HPI Sheri Park is a 53 y.o. female.  53 year old female who recently was diagnosed with influenza presents with 2-day history of fever, cough, congestion.  Does have positive sick exposures.  Has had emesis x2 which was nonbilious or bloody.  Has not had diarrhea.  Has had some productive cough.  Denies any dyspnea on exertion.  No headache or photophobia.  No rashes noted.  Mild scratchy throat.  Has been self-medicating with over-the-counter medications without relief.     Past Medical History:  Diagnosis Date  . Asthma   . Hypertension     Patient Active Problem List   Diagnosis Date Noted  . Acute coronary syndrome (HCC) 10/18/2016  . Chest pain on exertion 10/17/2016    Past Surgical History:  Procedure Laterality Date  . KNEE SURGERY    . LEFT HEART CATH AND CORONARY ANGIOGRAPHY N/A 10/20/2016   Procedure: Left Heart Cath and Coronary Angiography;  Surgeon: Orpah CobbAjay Kadakia, MD;  Location: [facility];  Service: Cardiovascular;  Laterality: N/A;  . TUBAL LIGATION       OB History   No obstetric history on file.      Home Medications    Prior to Admission medications   Medication Sig Start Date End Date Taking? Authorizing Provider  acetaminophen (TYLENOL) 325 MG tablet Take 650 mg by mouth every 6 (six) hours as needed for mild pain or moderate pain.    [provider]  albuterol (PROVENTIL HFA;VENTOLIN HFA) 108 (90 BASE) MCG/ACT inhaler Inhale 1-2 puffs into the lungs every 6 (six) hours as needed for wheezing or shortness of breath.    [provider]  amLODipine (NORVASC) 5 MG tablet Take 1 tablet (5 mg total) by mouth daily. 04/07/16   Elvina SidleLauenstein, Kurt, MD  aspirin EC 81 MG tablet Take 81 mg by mouth daily.    [provider]  cholecalciferol  (VITAMIN D) 1000 units tablet Take 1,000 Units by mouth daily.    [provider]  ferrous sulfate 325 (65 FE) MG tablet Take 1 tablet (325 mg total) by mouth 2 (two) times daily with a meal. 10/21/16   Orpah CobbKadakia, Ajay, MD  fluticasone (FLOVENT HFA) 110 MCG/ACT inhaler Inhale 1 puff into the lungs 2 (two) times daily. 12/06/14   Linwood DibblesKnapp, Jon, MD  furosemide (LASIX) 40 MG tablet Take 40 mg by mouth. Takes on Monday, Wednesday, and friday    [provider]  hydrochlorothiazide (HYDRODIURIL) 12.5 MG tablet Take 12.5 mg by mouth daily.    [provider]  mupirocin ointment (BACTROBAN) 2 % Apply 1 application topically 2 (two) times daily. 12/14/17   Cathie HoopsYu, Amy V, PA-C  nystatin cream (MYCOSTATIN) Apply to affected area 2 times daily 12/14/17   Belinda FisherYu, Amy V, PA-C  oseltamivir (TAMIFLU) 75 MG capsule Take 1 capsule (75 mg total) by mouth every 12 (twelve) hours. 06/16/18   Rodriguez-Southworth, Nettie ElmSylvia, PA-C  polyethylene glycol (MIRALAX) packet Take 17 g by mouth daily. 12/14/17   Cathie HoopsYu, Amy V, PA-C  potassium chloride (K-DUR) 10 MEQ tablet Take 10 mEq by mouth daily.    [provider]  promethazine (PHENERGAN) 25 MG tablet Take 1 tablet (25 mg total) by mouth every 6 (six) hours as needed for nausea or vomiting. 06/16/18   Rodriguez-Southworth, Nettie ElmSylvia, PA-C  vitamin C (VITAMIN C) 500 MG tablet Take 1 tablet (500 mg total) by mouth 2 (two) times daily with a meal. 10/21/16   Orpah CobbKadakia, Ajay, MD    Family History Family History  Problem Relation Age of Onset  . Diabetes Mother   . Cancer Mother     Social History Social History   Tobacco Use  . Smoking status: Never Smoker  . Smokeless tobacco: Never Used  Substance Use Topics  . Alcohol use: No  . Drug use: No     Allergies   Peanut-containing drug products; Shellfish allergy; Codeine; and Ketorolac tromethamine   Review of Systems Review of Systems  All other systems reviewed and are negative.    Physical  Exam Updated Vital Signs BP (!) 156/95 (BP Location: Right Arm)   Pulse 84   Temp (!) 97.5 F (36.4 C) (Oral)   Resp 18   Ht 1.651 m (5\' 5" )   Wt (!) 147.4 kg   LMP 08/11/2018   SpO2 96%   BMI 54.08 kg/m   Physical Exam Vitals signs and nursing note reviewed.  Constitutional:      General: She is not in acute distress.    Appearance: Normal appearance. She is well-developed. She is not toxic-appearing.  HENT:     Head: Normocephalic and atraumatic.  Eyes:     General: Lids are normal.     Conjunctiva/sclera: Conjunctivae normal.     Pupils: Pupils are equal, round, and reactive to light.  Neck:     Musculoskeletal: Normal range of motion and neck supple.     Thyroid: No thyroid mass.     Trachea: No tracheal deviation.  Cardiovascular:     Rate and Rhythm: Normal rate and regular rhythm.     Heart sounds: Normal heart sounds. No murmur. No gallop.   Pulmonary:     Effort: Pulmonary effort is normal. No respiratory distress.     Breath sounds: No stridor. Examination of the right-upper field reveals decreased breath sounds. Examination of the left-upper field reveals decreased breath sounds. Decreased breath sounds present. No wheezing, rhonchi or rales.  Abdominal:     General: Bowel sounds are normal. There is no distension.     Palpations: Abdomen is soft.     Tenderness: There is no abdominal tenderness. There is no rebound.  Musculoskeletal: Normal range of motion.        General: No tenderness.  Skin:    General: Skin is warm and dry.     Findings: No abrasion or rash.  Neurological:     Mental Status: She is alert and oriented to person, place, and time.     GCS: GCS eye subscore is 4. GCS verbal subscore is 5. GCS motor subscore is 6.     Cranial Nerves: No cranial nerve deficit.     Sensory: No sensory deficit.  Psychiatric:        Speech: Speech normal.        Behavior: Behavior normal.      ED Treatments / Results  Labs (all labs ordered are listed,  but only abnormal results are displayed) Labs Reviewed - No data to display  EKG None  Radiology No results found.  Procedures Procedures (including critical care time)  Medications Ordered in ED Medications  ondansetron (ZOFRAN-ODT) disintegrating tablet 8 mg (has no administration in time range)  chlorpheniramine-HYDROcodone (TUSSIONEX) 10-8 MG/5ML suspension 5 mL (has no administration in time range)     Initial Impression / Assessment  and Plan / ED Course  I have reviewed the triage vital signs and the nursing notes.  Pertinent labs & imaging results that were available during my care of the patient were reviewed by me and considered in my medical decision making (see chart for details).     Patient with negative chest x-ray here.  Given Zofran and cold medication feels better.  Suspect new viral illness and patient stable for discharge  Final Clinical Impressions(s) / ED Diagnoses   Final diagnoses:  Cough    ED Discharge Orders    None       Lorre Nick, MD 08/14/18 2327

## 2018-08-14 NOTE — ED Triage Notes (Signed)
Pt reports she just got over the flu and pneumonia.  Pt states she is sick again.  Pt reports headache, sore throat, difficulty swallowing, chills, cough.

## 2018-08-14 NOTE — ED Notes (Signed)
PT DISCHARGED. INSTRUCTIONS AND PRESCRIPTIONS GIVEN. AAOX4. PT IN NO APPARENT DISTRESS OR PAIN. THE OPPORTUNITY TO ASK QUESTIONS WAS PROVIDED. 

## 2018-08-20 ENCOUNTER — Other Ambulatory Visit: Payer: Self-pay

## 2018-08-20 ENCOUNTER — Encounter (HOSPITAL_COMMUNITY): Payer: Self-pay

## 2018-08-20 ENCOUNTER — Emergency Department (HOSPITAL_COMMUNITY)
Admission: EM | Admit: 2018-08-20 | Discharge: 2018-08-20 | Disposition: A | Discharge status: Home / Self Care | Payer: Medicaid Other | Attending: Emergency Medicine | Admitting: Emergency Medicine

## 2018-08-20 DIAGNOSIS — M5441 Lumbago with sciatica, right side: Secondary | ICD-10-CM | POA: Insufficient documentation

## 2018-08-20 DIAGNOSIS — Y92009 Unspecified place in unspecified non-institutional (private) residence as the place of occurrence of the external cause: Secondary | ICD-10-CM | POA: Insufficient documentation

## 2018-08-20 DIAGNOSIS — R52 Pain, unspecified: Secondary | ICD-10-CM | POA: Diagnosis not present

## 2018-08-20 DIAGNOSIS — Y9389 Activity, other specified: Secondary | ICD-10-CM | POA: Diagnosis not present

## 2018-08-20 DIAGNOSIS — M545 Low back pain: Secondary | ICD-10-CM | POA: Diagnosis present

## 2018-08-20 DIAGNOSIS — K5900 Constipation, unspecified: Secondary | ICD-10-CM | POA: Diagnosis not present

## 2018-08-20 DIAGNOSIS — J45909 Unspecified asthma, uncomplicated: Secondary | ICD-10-CM | POA: Insufficient documentation

## 2018-08-20 DIAGNOSIS — I10 Essential (primary) hypertension: Secondary | ICD-10-CM | POA: Insufficient documentation

## 2018-08-20 DIAGNOSIS — Y999 Unspecified external cause status: Secondary | ICD-10-CM | POA: Diagnosis not present

## 2018-08-20 DIAGNOSIS — X500XXA Overexertion from strenuous movement or load, initial encounter: Secondary | ICD-10-CM | POA: Insufficient documentation

## 2018-08-20 LAB — URINALYSIS, ROUTINE W REFLEX MICROSCOPIC
BILIRUBIN URINE: NEGATIVE
Glucose, UA: NEGATIVE mg/dL
Hgb urine dipstick: NEGATIVE
Ketones, ur: NEGATIVE mg/dL
LEUKOCYTES UA: NEGATIVE
NITRITE: NEGATIVE
PH: 8 (ref 5.0–8.0)
Protein, ur: NEGATIVE mg/dL
SPECIFIC GRAVITY, URINE: 1.012 (ref 1.005–1.030)

## 2018-08-20 MED ORDER — ACETAMINOPHEN 500 MG PO TABS
1000.0000 mg | ORAL_TABLET | Freq: Once | ORAL | Status: AC
  Administered 2018-08-20: 1000 mg via ORAL
  Filled 2018-08-20: qty 2

## 2018-08-20 MED ORDER — METHOCARBAMOL 500 MG PO TABS: 1000.0000 mg | ORAL_TABLET | Freq: Once | ORAL | Status: DC

## 2018-08-20 MED ORDER — METHOCARBAMOL 500 MG PO TABS: 500.0000 mg | ORAL_TABLET | Freq: Three times a day (TID) | ORAL | 0 refills | Status: DC | PRN

## 2018-08-20 MED ORDER — MIDAZOLAM HCL 2 MG/2ML IJ SOLN
2.0000 mg | Freq: Once | INTRAMUSCULAR | Status: AC
  Administered 2018-08-20: 2 mg via INTRAMUSCULAR
  Filled 2018-08-20: qty 2

## 2018-08-20 MED ORDER — METHOCARBAMOL 1000 MG/10ML IJ SOLN
1000.0000 mg | Freq: Once | INTRAMUSCULAR | Status: AC
  Administered 2018-08-20: 1000 mg via INTRAMUSCULAR
  Filled 2018-08-20: qty 10

## 2018-08-20 MED ORDER — LIDOCAINE 5 % EX PTCH: 1.0000 | MEDICATED_PATCH | CUTANEOUS | 0 refills | Status: DC

## 2018-08-20 NOTE — Discharge Instructions (Addendum)
You were evaluated in the Emergency Department and after careful evaluation, we did not find any emergent condition requiring admission or further testing in the hospital.  Your symptoms today seem to be due to muscle spasm.  Please use medications as directed for pain.  Please return to the Emergency Department if you experience any worsening of your condition.  We encourage you to follow up with a primary care provider.  Thank you for allowing [place] to be a part of your care.

## 2018-08-20 NOTE — ED Triage Notes (Signed)
Pt reports back spasms that started today. She states that she has had similar spasms in her legs, but never her back. The pain comes and goes and goes from her R shoulder down to her R leg. She also reports the she has been constipated for 3-4 days so she took some mag citrate around 11a.

## 2018-08-20 NOTE — ED Notes (Signed)
PT state unable to provide urine sample at this time.

## 2018-08-20 NOTE — ED Notes (Signed)
Patient walked to bathroom without any difficulty. °

## 2018-08-20 NOTE — ED Provider Notes (Addendum)
[facility] Emergency Department Provider Note MRN:  [id number]  Arrival date & time: 08/20/18     Chief Complaint   Back Pain   History of Present Illness   Sheri Park is a 53 y.o. year-old female with a history of hypertension, asthma presenting to the ED with chief complaint of back pain.  Patient explains that yesterday she was missing an electronic tablet, became nervous that it was lost or stolen, was frantically looking around the house for it, moving furniture.  Since that time has had progressively worsening back pain.  Located in the right lumbar back, radiates down the right leg.  Denies numbness or weakness to the arms or legs, denies bowel or bladder dysfunction.  Recent constipation that has improved with mag citrate at home.  Denies fever, no IV drug use.  Pain is constant, severe, worse with motion.  Review of Systems  A complete 10 system review of systems was obtained and all systems are negative except as noted in the HPI and PMH.   Patient's Health History    Past Medical History:  Diagnosis Date  . Asthma   . Hypertension     Past Surgical History:  Procedure Laterality Date  . KNEE SURGERY    . LEFT HEART CATH AND CORONARY ANGIOGRAPHY N/A 10/20/2016   Procedure: Left Heart Cath and Coronary Angiography;  Surgeon: Orpah Cobb, MD;  Location: [facility] INVASIVE CV LAB;  Service: Cardiovascular;  Laterality: N/A;  . TUBAL LIGATION      Family History  Problem Relation Age of Onset  . Diabetes Mother   . Cancer Mother     Social History   Socioeconomic History  . Marital status: Widowed    Spouse name: Not on file  . Number of children: 11  . Years of education: Not on file  . Highest education level: Not on file  Occupational History  . Occupation: CNA  Social Needs  . Financial resource strain: Not on file  . Food insecurity:    Worry: Not on file    Inability: Not on file  . Transportation needs:    Medical: Not on file   Non-medical: Not on file  Tobacco Use  . Smoking status: Never Smoker  . Smokeless tobacco: Never Used  Substance and Sexual Activity  . Alcohol use: No  . Drug use: No  . Sexual activity: Yes  Lifestyle  . Physical activity:    Days per week: Not on file    Minutes per session: Not on file  . Stress: Not on file  Relationships  . Social connections:    Talks on phone: Not on file    Gets together: Not on file    Attends religious service: Not on file    Active member of club or organization: Not on file    Attends meetings of clubs or organizations: Not on file    Relationship status: Not on file  . Intimate partner violence:    Fear of current or ex partner: Not on file    Emotionally abused: Not on file    Physically abused: Not on file    Forced sexual activity: Not on file  Other Topics Concern  . Not on file  Social History Narrative  . Not on file     Physical Exam  Vital Signs and Nursing Notes reviewed Vitals:   08/20/18 0259 08/20/18 0611  BP: (!) 158/89 (!) 145/95  Pulse: 83 86  Resp: 16 (!)  22  Temp: 97.8 F (36.6 C) 98.8 F (37.1 C)  SpO2: 96% (!) 83%    CONSTITUTIONAL: Well-appearing, in moderate distress due to pain with any movement NEURO:  Alert and oriented x 3, no focal deficits EYES:  eyes equal and reactive ENT/NECK:  no LAD, no JVD CARDIO: Regular rate, well-perfused, normal S1 and S2 PULM:  CTAB no wheezing or rhonchi GI/GU:  normal bowel sounds, non-distended, non-tender MSK/SPINE:  No gross deformities, no edema SKIN:  no rash, atraumatic PSYCH:  Appropriate speech and behavior  Diagnostic and Interventional Summary    Labs Reviewed  URINALYSIS, ROUTINE W REFLEX MICROSCOPIC    No orders to display    Medications  methocarbamol (ROBAXIN) injection 1,000 mg (has no administration in time range)  midazolam (VERSED) injection 2 mg (2 mg Intramuscular Given 08/20/18 0611)  acetaminophen (TYLENOL) tablet 1,000 mg (1,000 mg Oral Given  08/20/18 9753)     Procedures Critical Care  ED Course and Medical Decision Making  I have reviewed the triage vital signs and the nursing notes.  Pertinent labs & imaging results that were available during my care of the patient were reviewed by me and considered in my medical decision making (see below for details).  Will trial IM Versed for muscle spasm and reassess.  Patient is without red flags, nothing to suggest myelopathy.  Feeling better after medications listed above, ambulating in the ED with minimal assistance, appropriate for discharge.  A low oxygen saturation was documented prior to discharge, patient endorsing no shortness of breath, ambulated without issue, this was with a poor waveform, felt to be documented in error.  On my evaluation patient's O2 saturation is 96%.  After the discussed management above, the patient was determined to be safe for discharge.  The patient was in agreement with this plan and all questions regarding their care were answered.  ED return precautions were discussed and the patient will return to the ED with any significant worsening of condition.  Elmer Sow. Pilar Plate, MD [facility] Emergency Medicine [facility] [email or web address]  Final Clinical Impressions(s) / ED Diagnoses     ICD-10-CM   1. Acute right-sided low back pain with right-sided sciatica M54.41     ED Discharge Orders         Ordered    methocarbamol (ROBAXIN) 500 MG tablet  Every 8 hours PRN     08/20/18 0704    lidocaine (LIDODERM) 5 %  Every 24 hours     08/20/18 0704             Sabas Sous, MD 08/20/18 0051    Sabas Sous, MD 08/20/18 [phone]

## 2018-11-04 DIAGNOSIS — R0602 Shortness of breath: Secondary | ICD-10-CM | POA: Diagnosis not present

## 2018-11-04 DIAGNOSIS — I10 Essential (primary) hypertension: Secondary | ICD-10-CM | POA: Diagnosis not present

## 2018-11-04 DIAGNOSIS — R002 Palpitations: Secondary | ICD-10-CM | POA: Diagnosis not present

## 2018-11-04 DIAGNOSIS — R072 Precordial pain: Secondary | ICD-10-CM | POA: Diagnosis not present

## 2018-12-07 DIAGNOSIS — R0602 Shortness of breath: Secondary | ICD-10-CM | POA: Diagnosis not present

## 2018-12-07 DIAGNOSIS — R072 Precordial pain: Secondary | ICD-10-CM | POA: Diagnosis not present

## 2018-12-07 DIAGNOSIS — I10 Essential (primary) hypertension: Secondary | ICD-10-CM | POA: Diagnosis not present

## 2018-12-07 DIAGNOSIS — R002 Palpitations: Secondary | ICD-10-CM | POA: Diagnosis not present

## 2018-12-21 DIAGNOSIS — I10 Essential (primary) hypertension: Secondary | ICD-10-CM | POA: Diagnosis not present

## 2018-12-21 DIAGNOSIS — R002 Palpitations: Secondary | ICD-10-CM | POA: Diagnosis not present

## 2018-12-21 DIAGNOSIS — R072 Precordial pain: Secondary | ICD-10-CM | POA: Diagnosis not present

## 2018-12-21 DIAGNOSIS — R0602 Shortness of breath: Secondary | ICD-10-CM | POA: Diagnosis not present

## 2018-12-24 DIAGNOSIS — E7849 Other hyperlipidemia: Secondary | ICD-10-CM | POA: Diagnosis not present

## 2018-12-24 DIAGNOSIS — E876 Hypokalemia: Secondary | ICD-10-CM | POA: Diagnosis not present

## 2018-12-24 DIAGNOSIS — D649 Anemia, unspecified: Secondary | ICD-10-CM | POA: Diagnosis not present

## 2018-12-24 DIAGNOSIS — E559 Vitamin D deficiency, unspecified: Secondary | ICD-10-CM | POA: Diagnosis not present

## 2018-12-24 DIAGNOSIS — Z79899 Other long term (current) drug therapy: Secondary | ICD-10-CM | POA: Diagnosis not present

## 2019-01-20 DIAGNOSIS — I10 Essential (primary) hypertension: Secondary | ICD-10-CM | POA: Diagnosis not present

## 2019-01-20 DIAGNOSIS — R0602 Shortness of breath: Secondary | ICD-10-CM | POA: Diagnosis not present

## 2019-01-20 DIAGNOSIS — R072 Precordial pain: Secondary | ICD-10-CM | POA: Diagnosis not present

## 2019-03-02 DIAGNOSIS — R072 Precordial pain: Secondary | ICD-10-CM | POA: Diagnosis not present

## 2019-03-02 DIAGNOSIS — N912 Amenorrhea, unspecified: Secondary | ICD-10-CM | POA: Diagnosis not present

## 2019-03-02 DIAGNOSIS — R0602 Shortness of breath: Secondary | ICD-10-CM | POA: Diagnosis not present

## 2019-03-02 DIAGNOSIS — I10 Essential (primary) hypertension: Secondary | ICD-10-CM | POA: Diagnosis not present

## 2019-05-06 ENCOUNTER — Emergency Department (HOSPITAL_COMMUNITY): Payer: Medicaid Other

## 2019-05-06 ENCOUNTER — Other Ambulatory Visit: Payer: Self-pay

## 2019-05-06 ENCOUNTER — Emergency Department (HOSPITAL_COMMUNITY)
Admission: EM | Admit: 2019-05-06 | Discharge: 2019-05-06 | Disposition: A | Discharge status: Home / Self Care | Payer: Medicaid Other | Attending: Emergency Medicine | Admitting: Emergency Medicine

## 2019-05-06 DIAGNOSIS — I10 Essential (primary) hypertension: Secondary | ICD-10-CM | POA: Diagnosis not present

## 2019-05-06 DIAGNOSIS — R0902 Hypoxemia: Secondary | ICD-10-CM | POA: Diagnosis not present

## 2019-05-06 DIAGNOSIS — Z79899 Other long term (current) drug therapy: Secondary | ICD-10-CM | POA: Diagnosis not present

## 2019-05-06 DIAGNOSIS — J45901 Unspecified asthma with (acute) exacerbation: Secondary | ICD-10-CM | POA: Insufficient documentation

## 2019-05-06 DIAGNOSIS — R0602 Shortness of breath: Secondary | ICD-10-CM | POA: Diagnosis not present

## 2019-05-06 DIAGNOSIS — J80 Acute respiratory distress syndrome: Secondary | ICD-10-CM | POA: Diagnosis not present

## 2019-05-06 DIAGNOSIS — R0789 Other chest pain: Secondary | ICD-10-CM | POA: Diagnosis not present

## 2019-05-06 LAB — CBC WITH DIFFERENTIAL/PLATELET
Abs Immature Granulocytes: 0.03 10*3/uL (ref 0.00–0.07)
Basophils Absolute: 0 10*3/uL (ref 0.0–0.1)
Basophils Relative: 0 %
Eosinophils Absolute: 0.2 10*3/uL (ref 0.0–0.5)
Eosinophils Relative: 2 %
HCT: 33.3 % — ABNORMAL LOW (ref 36.0–46.0)
Hemoglobin: 10.7 g/dL — ABNORMAL LOW (ref 12.0–15.0)
Immature Granulocytes: 0 %
Lymphocytes Relative: 48 %
Lymphs Abs: 3.3 10*3/uL (ref 0.7–4.0)
MCH: 26.8 pg (ref 26.0–34.0)
MCHC: 32.1 g/dL (ref 30.0–36.0)
MCV: 83.5 fL (ref 80.0–100.0)
Monocytes Absolute: 0.5 10*3/uL (ref 0.1–1.0)
Monocytes Relative: 8 %
Neutro Abs: 2.9 10*3/uL (ref 1.7–7.7)
Neutrophils Relative %: 42 %
Platelets: 292 10*3/uL (ref 150–400)
RBC: 3.99 MIL/uL (ref 3.87–5.11)
RDW: 14.4 % (ref 11.5–15.5)
WBC: 7 10*3/uL (ref 4.0–10.5)
nRBC: 0 % (ref 0.0–0.2)

## 2019-05-06 LAB — BASIC METABOLIC PANEL
Anion gap: 9 (ref 5–15)
BUN: 8 mg/dL (ref 6–20)
CO2: 23 mmol/L (ref 22–32)
Calcium: 9 mg/dL (ref 8.9–10.3)
Chloride: 106 mmol/L (ref 98–111)
Creatinine, Ser: 0.97 mg/dL (ref 0.44–1.00)
GFR calc Af Amer: >60 mL/min (ref >60)
GFR calc non Af Amer: >60 mL/min (ref >60)
Glucose, Bld: 109 mg/dL — ABNORMAL HIGH (ref 70–99)
Potassium: 3.6 mmol/L (ref 3.5–5.1)
Sodium: 138 mmol/L (ref 135–145)

## 2019-05-06 LAB — BRAIN NATRIURETIC PEPTIDE: B Natriuretic Peptide: 50.1 pg/mL (ref 0.0–100.0)

## 2019-05-06 LAB — TROPONIN I (HIGH SENSITIVITY): Troponin I (High Sensitivity): 4 ng/L (ref <18)

## 2019-05-06 MED ORDER — METHYLPREDNISOLONE 4 MG PO TBPK: ORAL_TABLET | ORAL | 0 refills | Status: DC

## 2019-05-06 MED ORDER — ALBUTEROL SULFATE HFA 108 (90 BASE) MCG/ACT IN AERS: 1.0000 | INHALATION_SPRAY | Freq: Four times a day (QID) | RESPIRATORY_TRACT | 2 refills | Status: DC | PRN

## 2019-05-06 MED ORDER — IPRATROPIUM-ALBUTEROL 0.5-2.5 (3) MG/3ML IN SOLN
3.0000 mL | Freq: Three times a day (TID) | RESPIRATORY_TRACT | Status: DC | PRN
  Administered 2019-05-06 (×2): 3 mL via RESPIRATORY_TRACT
  Filled 2019-05-06: qty 6
  Filled 2019-05-06: qty 3

## 2019-05-06 NOTE — ED Notes (Signed)
Pt was verbalized discharge instructions. Pt had no further questions at this time. NAD. 

## 2019-05-06 NOTE — ED Provider Notes (Signed)
[facility]-EMERGENCY DEPT Provider Note   CSN: [id number] Arrival date & time: 05/06/19  1648     History   Chief Complaint Chief Complaint  Patient presents with  . Asthma    HPI Sheri Park is a 53 y.o. female with a history of asthma, obesity, "a heart condition," on lasix, presenting with chest tightness and shortness of breath.  She reports that she near a couple of dogs yesterday, who were receiving a hair cut, and dog hair is a big trigger for her asthma.  She states she began coughing yesterday and overnight had worsening to the morning.  She feels like her chest is very tight and she cannot catch her breath.  She did try taking albuterol nebulizers at home every 6 hours, but they are not relieving her symptoms.  EMS gave the patient 125 mg of Solu-Medrol through an IV.  She received a nebulizer with albuterol and Atrovent en route.  Patient denies any prior history of intubation for her asthma.  She states that her last steroid course was 2 years ago.  She separately reports that she has had lower leg swelling bilaterally for several weeks.  The swelling is symmetrical.  She says she only walk a few blocks were feeling short of breath.  She denies that she is ever been told that she has heart failure.  She does have a cardiac catheterization 2 years ago for recurrent chest pain which showed no significant plaque build up.  She is on a diuretic medication.   Patient denies personal or family history of DVT or PE. No recent hormone use (including OCP); travel for >6 hours; prolonged immobilization for greater than 3 days; surgeries or trauma in the last 4 weeks; or malignancy with treatment within 6 months.     HPI  Past Medical History:  Diagnosis Date  . Asthma   . Hypertension     Patient Active Problem List   Diagnosis Date Noted  . Acute coronary syndrome (HCC) 10/18/2016  . Chest pain on exertion 10/17/2016    Past Surgical History:   Procedure Laterality Date  . KNEE SURGERY    . LEFT HEART CATH AND CORONARY ANGIOGRAPHY N/A 10/20/2016   Procedure: Left Heart Cath and Coronary Angiography;  Surgeon: Orpah Cobb, MD;  Location: [facility];  Service: Cardiovascular;  Laterality: N/A;  . TUBAL LIGATION       OB History   No obstetric history on file.      Home Medications    Prior to Admission medications   Medication Sig Start Date End Date Taking? Authorizing Provider  acetaminophen (TYLENOL) 500 MG tablet Take 500 mg by mouth every 6 (six) hours as needed for moderate pain.   Yes [provider]  albuterol (PROVENTIL HFA;VENTOLIN HFA) 108 (90 BASE) MCG/ACT inhaler Inhale 1-2 puffs into the lungs every 6 (six) hours as needed for wheezing or shortness of breath.   Yes [provider]  amLODipine (NORVASC) 5 MG tablet Take 1 tablet (5 mg total) by mouth daily. 04/07/16  Yes [name], MD  [employer] D3 25 MCG (1000 UT) capsule Take 1,000 Units by mouth daily. 01/12/19  Yes [provider]  hydrochlorothiazide (HYDRODIURIL) 12.5 MG tablet Take 12.5 mg by mouth daily.   Yes [provider]  KLOR-CON M10 10 MEQ tablet Take 10 mEq by mouth daily. 12/16/18  Yes [provider]  lidocaine (LIDODERM) 5 % Place 1 patch onto the skin daily.  Remove & Discard patch within 12 hours or as directed by MD Patient taking differently: Place 1 patch onto the skin daily as needed (pain). Remove & Discard patch within 12 hours or as directed by MD 08/20/18  Yes Sabas Sous, MD  losartan (COZAAR) 25 MG tablet Take 50 mg by mouth daily. 01/12/19  Yes [provider]  methocarbamol (ROBAXIN) 500 MG tablet Take 1 tablet (500 mg total) by mouth every 8 (eight) hours as needed for muscle spasms. 08/20/18  Yes Sabas Sous, MD  potassium chloride (K-DUR) 10 MEQ tablet Take 10 mEq by mouth daily.   Yes [provider]  vitamin C (ASCORBIC ACID) 250 MG tablet Take 250 mg by mouth  daily.   Yes [provider]  albuterol (VENTOLIN HFA) 108 (90 Base) MCG/ACT inhaler Inhale 1-2 puffs into the lungs every 6 (six) hours as needed for wheezing or shortness of breath. 05/06/19   Terald Sleeper, MD  chlorpheniramine-HYDROcodone (TUSSIONEX PENNKINETIC ER) 10-8 MG/5ML SUER Take 5 mLs by mouth every 12 (twelve) hours as needed for cough. Patient not taking: Reported on 08/20/2018 08/14/18   Lorre Nick, MD  ferrous sulfate 325 (65 FE) MG tablet Take 1 tablet (325 mg total) by mouth 2 (two) times daily with a meal. Patient not taking: Reported on 05/06/2019 10/21/16   Orpah Cobb, MD  fluticasone (FLOVENT HFA) 110 MCG/ACT inhaler Inhale 1 puff into the lungs 2 (two) times daily. Patient not taking: Reported on 05/06/2019 12/06/14   Linwood Dibbles, MD  furosemide (LASIX) 40 MG tablet Take 40 mg by mouth. Takes on Monday, Wednesday, and friday    [provider]  methylPREDNISolone (MEDROL DOSEPAK) 4 MG TBPK tablet Take as directed on package 05/07/19   Terald Sleeper, MD  mupirocin ointment (BACTROBAN) 2 % Apply 1 application topically 2 (two) times daily. Patient not taking: Reported on 08/20/2018 12/14/17   Belinda Fisher, PA-C  nystatin cream (MYCOSTATIN) Apply to affected area 2 times daily Patient not taking: Reported on 08/20/2018 12/14/17   Belinda Fisher, PA-C  ondansetron (ZOFRAN ODT) 8 MG disintegrating tablet Take 1 tablet (8 mg total) by mouth every 8 (eight) hours as needed for nausea or vomiting. Patient not taking: Reported on 08/20/2018 08/14/18   Lorre Nick, MD  oseltamivir (TAMIFLU) 75 MG capsule Take 1 capsule (75 mg total) by mouth every 12 (twelve) hours. Patient not taking: Reported on 08/20/2018 06/16/18   Rodriguez-Southworth, Nettie Elm, PA-C  polyethylene glycol [facility]) packet Take 17 g by mouth daily. Patient not taking: Reported on 08/20/2018 12/14/17   Belinda Fisher, PA-C  promethazine (PHENERGAN) 25 MG tablet Take 1 tablet (25 mg total) by mouth every 6 (six) hours as  needed for nausea or vomiting. Patient not taking: Reported on 08/20/2018 06/16/18   Rodriguez-Southworth, Nettie Elm, PA-C  vitamin C (VITAMIN C) 500 MG tablet Take 1 tablet (500 mg total) by mouth 2 (two) times daily with a meal. Patient not taking: Reported on 05/06/2019 10/21/16   Orpah Cobb, MD    Family History Family History  Problem Relation Age of Onset  . Diabetes Mother   . Cancer Mother     Social History Social History   Tobacco Use  . Smoking status: Never Smoker  . Smokeless tobacco: Never Used  Substance Use Topics  . Alcohol use: No  . Drug use: No     Allergies   Peanut-containing drug products, Shellfish allergy, Codeine, and Ketorolac tromethamine   Review  of Systems Review of Systems  Constitutional: Negative for chills and fever.  Respiratory: Positive for chest tightness, shortness of breath and wheezing. Negative for cough.   Cardiovascular: Positive for leg swelling. Negative for chest pain and palpitations.  Gastrointestinal: Negative for nausea and vomiting.  Musculoskeletal: Negative for arthralgias and back pain.  Skin: Negative for rash and wound.  Neurological: Negative for syncope and headaches.  All other systems reviewed and are negative.    Physical Exam Updated Vital Signs BP (!) 167/78   Pulse 69   Temp 98.1 F (36.7 C) (Oral)   Resp (!) 25   Ht 5\' 4"  (1.626 m)   Wt (!) 147.4 kg   LMP 04/18/2019   SpO2 100%   BMI 55.79 kg/m   Physical Exam Vitals signs and nursing note reviewed.  Constitutional:      General: She is not in acute distress.    Appearance: She is well-developed. She is not ill-appearing.     Comments: Wheezy, soft voice  HENT:     Head: Normocephalic and atraumatic.  Eyes:     Conjunctiva/sclera: Conjunctivae normal.  Neck:     Musculoskeletal: Neck supple.  Cardiovascular:     Rate and Rhythm: Normal rate and regular rhythm.     Pulses: Normal pulses.     Heart sounds: No murmur.  Pulmonary:      Effort: Pulmonary effort is normal. No respiratory distress.     Breath sounds: No rhonchi or rales.     Comments: 100% on room air Breath sounds distant due to large body habitus Very mild expiratory wheezing audible bilaterally Musculoskeletal:     Comments: Symmetrical bilateral +1 pitting edema of the lower extremities No focal calf tenderness or venous tenderness  Skin:    General: Skin is warm and dry.  Neurological:     Mental Status: She is alert.  Psychiatric:        Mood and Affect: Mood normal.        Behavior: Behavior normal.      ED Treatments / Results  Labs (all labs ordered are listed, but only abnormal results are displayed) Labs Reviewed  BASIC METABOLIC PANEL - Abnormal; Notable for the following components:      Result Value   Glucose, Bld 109 (*)    All other components within normal limits  CBC WITH DIFFERENTIAL/PLATELET - Abnormal; Notable for the following components:   Hemoglobin 10.7 (*)    HCT 33.3 (*)    All other components within normal limits  BRAIN NATRIURETIC PEPTIDE  TROPONIN I (HIGH SENSITIVITY)    EKG EKG Interpretation  Date/Time:  Friday May 06 2019 17:15:02 EDT Ventricular Rate:  76 PR Interval:    QRS Duration: 91 QT Interval:  440 QTC Calculation: 495 R Axis:   25 Text Interpretation:  Sinus rhythm Low voltage, precordial leads Borderline prolonged QT interval No STEMI  Confirmed by Octaviano Glow [phone]) on 05/06/2019 5:34:40 PM   Radiology Dg Chest Portable 1 View  Result Date: 05/06/2019 CLINICAL DATA:  Asthma attack EXAM: PORTABLE CHEST 1 VIEW COMPARISON:  08/14/2018 FINDINGS: The heart size and mediastinal contours are within normal limits. Both lungs are clear. The visualized skeletal structures are unremarkable. IMPRESSION: No acute abnormality of the lungs in AP portable projection. Electronically Signed   By: Eddie Candle M.D.   On: 05/06/2019 18:08    Procedures Procedures (including critical care time)   Medications Ordered in ED Medications - No data to display  Initial Impression / Assessment and Plan / ED Course  I have reviewed the triage vital signs and the nursing notes.  Pertinent labs & imaging results that were available during my care of the patient were reviewed by me and considered in my medical decision making (see chart for details).  53 yo female presenting to ED with shortness of breath after being exposed to dog hair yesterday.  History is consistent with asthma exacerbation.  No concern for anaphylaxis at this time.  Patient already received steroids IV from EMS, will give 3 rounds of duonebs here and reassess her status.  She is LOW RISK for covid-19 based on her clinical history; I do not believe she needs Covid testing at this time.  She reports a history of "heart problems," and is taking a diuretic, but denies being told she has heart failure.  Her cardiac cath 2 years ago showed no obstruction. I do not see an echocardiogram in our records or [facility]where. I believe it is reasonable to check BNP, trop, ECG here given this reported cardiac hx and her leg swelling.  I have a much lower suspicion for PE at this time.  Her leg swelling is bilateral, symmetrical, and inconsistent with DVT.  She has no tachycardia.  Finally, the onset of her symptoms is consistent more with asthma and pulmonary embolism.  This note was dictated using dragon dictation software.  Please be aware that there may be minor translation errors as a result of this oral dictation   Clinical Course as of May 06 1100  Fri May 06, 2019  1919 Did not get duonebs yet, I spoke to the nurse and asked that 3 rounds be administered.  Low risk for COVID.   [MT]  2044 Subjective improvement after 3 duonebs, will discharge home   [MT]    Clinical Course User Index [MT] Jaymarion Trombly, Kermit BaloMatthew J, MD    Final Clinical Impressions(s) / ED Diagnoses   Final diagnoses:  Exacerbation of asthma, unspecified asthma  severity, unspecified whether persistent    ED Discharge Orders         Ordered    methylPREDNISolone (MEDROL DOSEPAK) 4 MG TBPK tablet     05/06/19 2045    albuterol (VENTOLIN HFA) 108 (90 Base) MCG/ACT inhaler  Every 6 hours PRN     05/06/19 2045           Terald Sleeperrifan, Berthold Glace J, MD 05/07/19 1101

## 2019-05-06 NOTE — ED Notes (Signed)
Pt able to speak in complete sentences. No audible wheezing or distress

## 2019-05-06 NOTE — ED Triage Notes (Signed)
Per EMS, patient was driving and had asthma attack. Patient drove home and was unable to find rescue inhaler. When EMS arrived patient had audible inspiratory and expiratory wheezes. In route patient received 5 albuterol, 0.5atrovent, 125mg  solumedrol . Patient had c/o chest tightness with EMS.   Vitals reported by EMS 75 heartrate 158/91 97% on 10L NRB

## 2019-05-06 NOTE — ED Notes (Signed)
Xray at bedisde

## 2019-05-06 NOTE — ED Notes (Signed)
Trifan MD at bedside 

## 2019-07-11 DIAGNOSIS — R072 Precordial pain: Secondary | ICD-10-CM | POA: Diagnosis not present

## 2019-07-11 DIAGNOSIS — R0602 Shortness of breath: Secondary | ICD-10-CM | POA: Diagnosis not present

## 2019-07-11 DIAGNOSIS — I10 Essential (primary) hypertension: Secondary | ICD-10-CM | POA: Diagnosis not present

## 2019-09-30 DIAGNOSIS — I10 Essential (primary) hypertension: Secondary | ICD-10-CM | POA: Diagnosis not present

## 2019-09-30 DIAGNOSIS — R0602 Shortness of breath: Secondary | ICD-10-CM | POA: Diagnosis not present

## 2019-09-30 DIAGNOSIS — R072 Precordial pain: Secondary | ICD-10-CM | POA: Diagnosis not present

## 2019-12-04 IMAGING — CR DG LUMBAR SPINE COMPLETE 4+V
5 series · 5 of 5 positions shown · non-contrast
Comparison: CT scan January 28, 2014

CLINICAL DATA: Low back pain with left leg numbness.

EXAM:
LUMBAR SPINE - COMPLETE 4+ VIEW

[t l-spine a.p.]
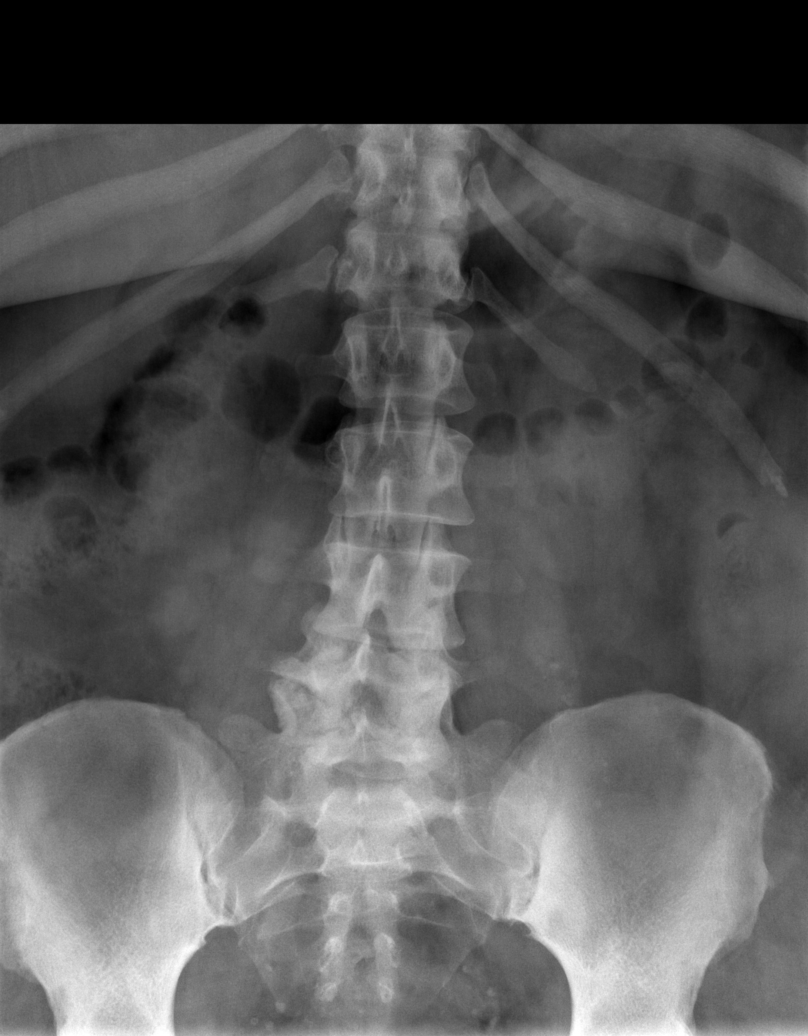

[t l-spine oblique exposure * (1 of 2)]
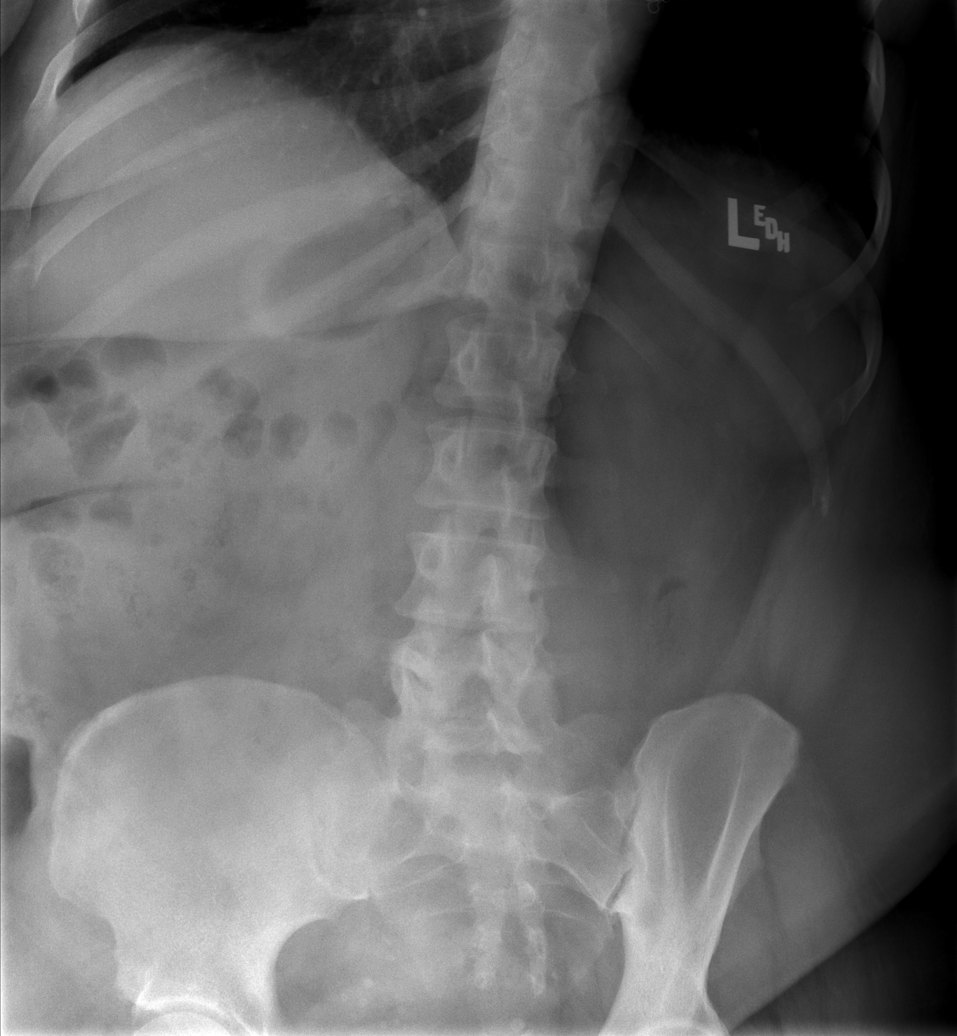

[t l-spine oblique exposure * (2 of 2)]
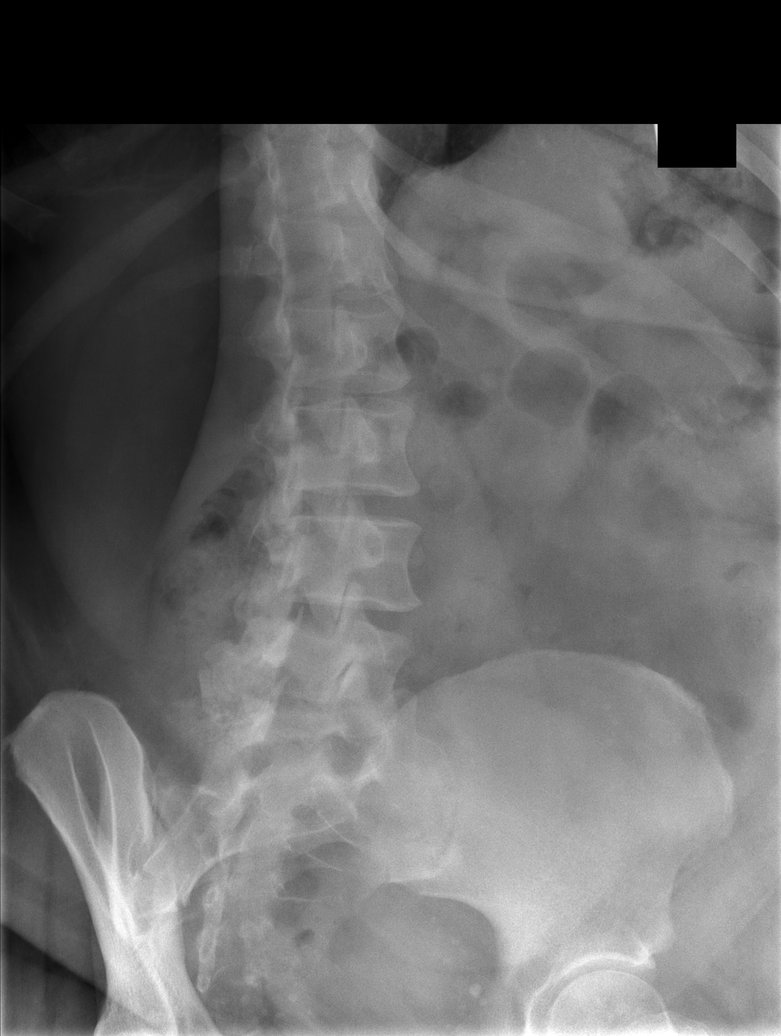

[t l-spine lat *]
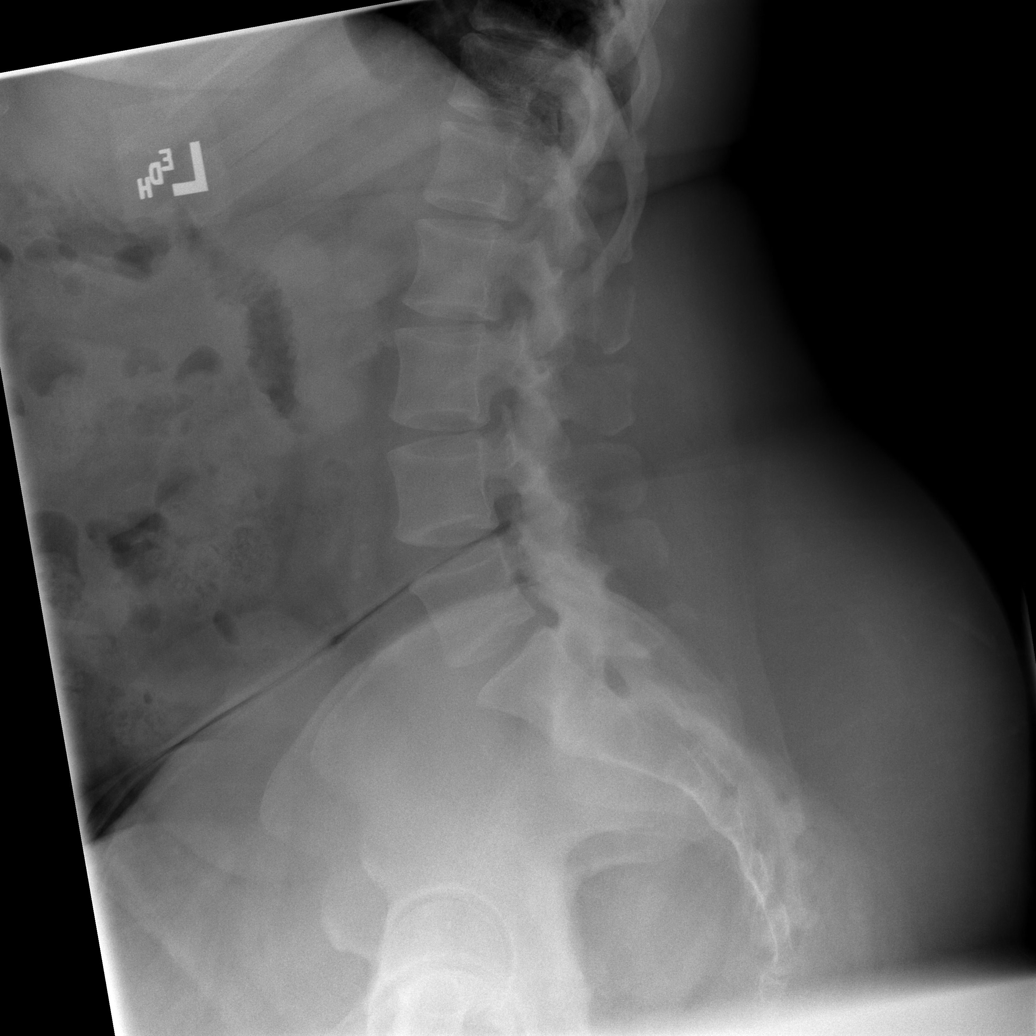

[t l-spine l5-s1 spot *]
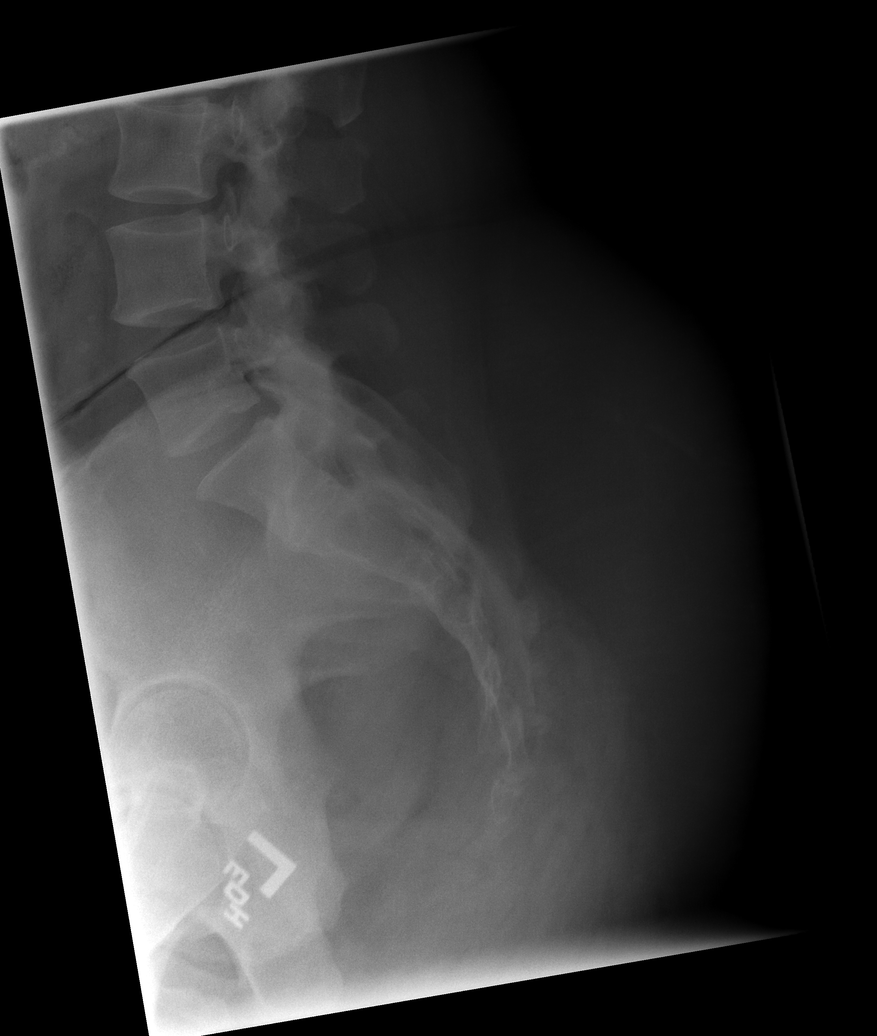

[5 of 5 positions shown; findings below may reference images not displayed]

FINDINGS: Calcifications the left lower lumbar spine and over the medial left
iliac crests are noted to be venous in nature on previous CT
imaging. Calcifications in the pelvis are likely phleboliths. No
other soft tissue abnormalities.

No malalignment. No fractures. Minimal degenerative changes with
tiny anterior osteophytes. Lower lumbar facet degenerative changes,
mild.
IMPRESSION: 1. No fracture or traumatic malalignment.
2. Minimal degenerative disc disease. Mild lower lumbar facet
degenerative changes.

## 2019-12-22 ENCOUNTER — Other Ambulatory Visit: Payer: Self-pay

## 2019-12-22 ENCOUNTER — Ambulatory Visit (HOSPITAL_COMMUNITY)
Admission: EM | Admit: 2019-12-22 | Discharge: 2019-12-22 | Disposition: A | Discharge status: Home / Self Care | Payer: Medicaid Other | Attending: Physician Assistant | Admitting: Physician Assistant

## 2019-12-22 ENCOUNTER — Encounter (HOSPITAL_COMMUNITY): Payer: Self-pay

## 2019-12-22 DIAGNOSIS — R102 Pelvic and perineal pain: Secondary | ICD-10-CM | POA: Diagnosis not present

## 2019-12-22 DIAGNOSIS — Z98891 History of uterine scar from previous surgery: Secondary | ICD-10-CM | POA: Insufficient documentation

## 2019-12-22 DIAGNOSIS — R1033 Periumbilical pain: Secondary | ICD-10-CM

## 2019-12-22 DIAGNOSIS — N946 Dysmenorrhea, unspecified: Secondary | ICD-10-CM | POA: Diagnosis present

## 2019-12-22 DIAGNOSIS — R109 Unspecified abdominal pain: Secondary | ICD-10-CM | POA: Diagnosis not present

## 2019-12-22 LAB — POCT URINALYSIS DIP (DEVICE)
Bilirubin Urine: NEGATIVE
Glucose, UA: NEGATIVE mg/dL
Hgb urine dipstick: NEGATIVE
Ketones, ur: NEGATIVE mg/dL
Leukocytes,Ua: NEGATIVE
Nitrite: NEGATIVE
Protein, ur: NEGATIVE mg/dL
Specific Gravity, Urine: 1.025 (ref 1.005–1.030)
Urobilinogen, UA: 0.2 mg/dL (ref 0.0–1.0)
pH: 5.5 (ref 5.0–8.0)

## 2019-12-22 MED ORDER — ACETAMINOPHEN 325 MG PO TABS: 650.0000 mg | ORAL_TABLET | Freq: Four times a day (QID) | ORAL | 0 refills | Status: DC | PRN

## 2019-12-22 NOTE — ED Provider Notes (Signed)
[facility]   MRN: [id number] DOB: 1966-07-04  Subjective:   Sheri Park is a 54 y.o. female presenting for 1 week history of recurrent lower belly pain from the pelvic area up to her umbilicus.  Patient states that she regularly gets these pains around her cycle.  Has a history of a C-section and is wondering if this plays a role.  She just finished her cycle, typically has heavy bleeding, pelvic pains like this.  She has not been able to control her pain however, cannot take NSAIDs and does not tolerate tramadol and narcotic medications well.  She does not have any nausea, vomiting, dysuria, vaginal discharge, genital rash.  Patient is sexually active with her husband only.  However, she is not opposed to STI testing.  No current facility-administered medications for this encounter.  Current Outpatient Medications:  .  acetaminophen (TYLENOL) 500 MG tablet, Take 500 mg by mouth every 6 (six) hours as needed for moderate pain., Disp: , Rfl:  .  albuterol (VENTOLIN HFA) 108 (90 Base) MCG/ACT inhaler, Inhale 1-2 puffs into the lungs every 6 (six) hours as needed for wheezing or shortness of breath., Disp: 8 g, Rfl: 2 .  amLODipine (NORVASC) 5 MG tablet, Take 1 tablet (5 mg total) by mouth daily., Disp: 90 tablet, Rfl: 3 .  CVS D3 25 MCG (1000 UT) capsule, Take 1,000 Units by mouth daily., Disp: , Rfl:  .  furosemide (LASIX) 40 MG tablet, Take 40 mg by mouth. Takes on Monday, Wednesday, and friday, Disp: , Rfl:  .  hydrochlorothiazide (HYDRODIURIL) 12.5 MG tablet, Take 12.5 mg by mouth daily., Disp: , Rfl:  .  lidocaine (LIDODERM) 5 %, Place 1 patch onto the skin daily. Remove & Discard patch within 12 hours or as directed by MD (Patient taking differently: Place 1 patch onto the skin daily as needed (pain). Remove & Discard patch within 12 hours or as directed by MD), Disp: 5 patch, Rfl: 0 .  losartan (COZAAR) 25 MG tablet, Take 50 mg by mouth daily., Disp: , Rfl:  .  vitamin C  (ASCORBIC ACID) 250 MG tablet, Take 250 mg by mouth daily., Disp: , Rfl:    Allergies  Allergen Reactions  . Peanut-Containing Drug Products Anaphylaxis    Oil, nuts, anything peanut related  . Shellfish Allergy Anaphylaxis  . Codeine Itching  . Ketorolac Tromethamine Swelling    Past Medical History:  Diagnosis Date  . Asthma   . Hypertension      Past Surgical History:  Procedure Laterality Date  . KNEE SURGERY    . LEFT HEART CATH AND CORONARY ANGIOGRAPHY N/A 10/20/2016   Procedure: Left Heart Cath and Coronary Angiography;  Surgeon: Orpah Cobb, MD;  Location: [facility];  Service: Cardiovascular;  Laterality: N/A;  . TUBAL LIGATION      Family History  Problem Relation Age of Onset  . Diabetes Mother   . Cancer Mother     Social History   Tobacco Use  . Smoking status: Never Smoker  . Smokeless tobacco: Never Used  Vaping Use  . Vaping Use: Never used  Substance Use Topics  . Alcohol use: No  . Drug use: No    ROS   Objective:   Vitals: BP (!) 145/90 (BP Location: Right Arm)   Pulse 72   Temp 98.1 F (36.7 C) (Oral)   Resp (!) 21   Wt (!) 340 lb 12.8 oz (154.6 kg)   LMP 12/15/2019  SpO2 100%   BMI 58.50 kg/m   Physical Exam Constitutional:      General: She is not in acute distress.    Appearance: Normal appearance. She is well-developed. She is not ill-appearing, toxic-appearing or diaphoretic.  HENT:     Head: Normocephalic and atraumatic.     Nose: Nose normal.     Mouth/Throat:     Mouth: Mucous membranes are moist.     Pharynx: Oropharynx is clear.  Eyes:     General: No scleral icterus.    Extraocular Movements: Extraocular movements intact.     Pupils: Pupils are equal, round, and reactive to light.  Cardiovascular:     Rate and Rhythm: Normal rate.  Pulmonary:     Effort: Pulmonary effort is normal.  Abdominal:     Tenderness: There is abdominal tenderness in the periumbilical area and suprapubic area.  Skin:     General: Skin is warm and dry.  Neurological:     General: No focal deficit present.     Mental Status: She is alert and oriented to person, place, and time.  Psychiatric:        Mood and Affect: Mood normal.        Behavior: Behavior normal.     Results for orders placed or performed during the hospital encounter of 12/22/19 (from the past 24 hour(s))  POCT urinalysis dip (device)     Status: None   Collection Time: 12/22/19 11:36 AM  Result Value Ref Range   Glucose, UA NEGATIVE NEGATIVE mg/dL   Bilirubin Urine NEGATIVE NEGATIVE   Ketones, ur NEGATIVE NEGATIVE mg/dL   Specific Gravity, Urine 1.025 1.005 - 1.030   Hgb urine dipstick NEGATIVE NEGATIVE   pH 5.5 5.0 - 8.0   Protein, ur NEGATIVE NEGATIVE mg/dL   Urobilinogen, UA 0.2 0.0 - 1.0 mg/dL   Nitrite NEGATIVE NEGATIVE   Leukocytes,Ua NEGATIVE NEGATIVE    Assessment and Plan :   PDMP not reviewed this encounter.  1. Pelvic pain   2. Periumbilical pain   3. History of cesarean section   4. Dysmenorrhea     We will treat based off of lab results.  Patient is to schedule Tylenol, prescription provided to her for this.  Recommended follow-up with gynecology practice for consideration of pelvic and transvaginal ultrasound, consult on dysmenorrhea. Counseled patient on potential for adverse effects with medications prescribed/recommended today, ER and return-to-clinic precautions discussed, patient verbalized understanding.    Jaynee Eagles, [place] 12/22/19 1448

## 2019-12-22 NOTE — Discharge Instructions (Signed)
Talk with your heart doctor about being on HCTZ and Lasix as this can lower your potassium and cause muscle aches, pains, cramps.

## 2019-12-22 NOTE — ED Triage Notes (Signed)
Pt is here with abdominal pain for a week now, pt has taken Tylenol to relieve discomfort.

## 2019-12-23 ENCOUNTER — Telehealth (HOSPITAL_COMMUNITY): Payer: Self-pay | Admitting: Orthopedic Surgery

## 2019-12-23 LAB — CERVICOVAGINAL ANCILLARY ONLY
Bacterial Vaginitis (gardnerella): POSITIVE — AB
Chlamydia: NEGATIVE
Comment: NEGATIVE
Comment: NEGATIVE
Comment: NEGATIVE
Comment: NORMAL
Neisseria Gonorrhea: NEGATIVE
Trichomonas: NEGATIVE

## 2019-12-23 MED ORDER — METRONIDAZOLE 500 MG PO TABS: 500.0000 mg | ORAL_TABLET | Freq: Two times a day (BID) | ORAL | 0 refills | Status: DC

## 2020-01-06 ENCOUNTER — Other Ambulatory Visit: Payer: Self-pay | Admitting: Cardiovascular Disease

## 2020-01-06 DIAGNOSIS — N6019 Diffuse cystic mastopathy of unspecified breast: Secondary | ICD-10-CM

## 2020-01-06 DIAGNOSIS — N644 Mastodynia: Secondary | ICD-10-CM

## 2020-01-24 ENCOUNTER — Ambulatory Visit
Admission: RE | Admit: 2020-01-24 | Discharge: 2020-01-24 | Disposition: A | Discharge status: Home / Self Care | Payer: Medicaid Other | Source: Ambulatory Visit | Attending: Cardiovascular Disease | Admitting: Cardiovascular Disease

## 2020-01-24 ENCOUNTER — Other Ambulatory Visit: Payer: Self-pay

## 2020-01-24 DIAGNOSIS — N644 Mastodynia: Secondary | ICD-10-CM

## 2020-01-24 DIAGNOSIS — R928 Other abnormal and inconclusive findings on diagnostic imaging of breast: Secondary | ICD-10-CM | POA: Diagnosis not present

## 2020-01-24 DIAGNOSIS — N6452 Nipple discharge: Secondary | ICD-10-CM | POA: Diagnosis not present

## 2020-01-24 DIAGNOSIS — N6019 Diffuse cystic mastopathy of unspecified breast: Secondary | ICD-10-CM

## 2020-08-01 ENCOUNTER — Encounter (HOSPITAL_COMMUNITY): Payer: Self-pay

## 2020-08-01 ENCOUNTER — Emergency Department (HOSPITAL_COMMUNITY): Payer: Medicaid Other

## 2020-08-01 ENCOUNTER — Emergency Department (HOSPITAL_COMMUNITY)
Admission: EM | Admit: 2020-08-01 | Discharge: 2020-08-01 | Disposition: A | Discharge status: Home / Self Care | Payer: Medicaid Other | Attending: Emergency Medicine | Admitting: Emergency Medicine

## 2020-08-01 ENCOUNTER — Encounter (HOSPITAL_COMMUNITY): Payer: Self-pay | Admitting: Emergency Medicine

## 2020-08-01 ENCOUNTER — Ambulatory Visit (HOSPITAL_COMMUNITY)
Admission: EM | Admit: 2020-08-01 | Discharge: 2020-08-01 | Disposition: A | Discharge status: Home / Self Care | Payer: Medicaid Other | Attending: Emergency Medicine | Admitting: Emergency Medicine

## 2020-08-01 ENCOUNTER — Other Ambulatory Visit: Payer: Self-pay

## 2020-08-01 DIAGNOSIS — Z885 Allergy status to narcotic agent status: Secondary | ICD-10-CM | POA: Insufficient documentation

## 2020-08-01 DIAGNOSIS — Z20822 Contact with and (suspected) exposure to covid-19: Secondary | ICD-10-CM | POA: Diagnosis not present

## 2020-08-01 DIAGNOSIS — R059 Cough, unspecified: Secondary | ICD-10-CM | POA: Diagnosis not present

## 2020-08-01 DIAGNOSIS — I10 Essential (primary) hypertension: Secondary | ICD-10-CM | POA: Diagnosis not present

## 2020-08-01 DIAGNOSIS — R21 Rash and other nonspecific skin eruption: Secondary | ICD-10-CM | POA: Diagnosis not present

## 2020-08-01 DIAGNOSIS — M791 Myalgia, unspecified site: Secondary | ICD-10-CM | POA: Diagnosis not present

## 2020-08-01 DIAGNOSIS — R52 Pain, unspecified: Secondary | ICD-10-CM | POA: Diagnosis not present

## 2020-08-01 DIAGNOSIS — M62838 Other muscle spasm: Secondary | ICD-10-CM | POA: Insufficient documentation

## 2020-08-01 DIAGNOSIS — R0602 Shortness of breath: Secondary | ICD-10-CM | POA: Diagnosis not present

## 2020-08-01 DIAGNOSIS — Z5321 Procedure and treatment not carried out due to patient leaving prior to being seen by health care provider: Secondary | ICD-10-CM | POA: Diagnosis not present

## 2020-08-01 DIAGNOSIS — Z79899 Other long term (current) drug therapy: Secondary | ICD-10-CM | POA: Insufficient documentation

## 2020-08-01 DIAGNOSIS — J9811 Atelectasis: Secondary | ICD-10-CM | POA: Diagnosis not present

## 2020-08-01 LAB — BASIC METABOLIC PANEL
Anion gap: 11 (ref 5–15)
BUN: 7 mg/dL (ref 6–20)
CO2: 25 mmol/L (ref 22–32)
Calcium: 9 mg/dL (ref 8.9–10.3)
Chloride: 104 mmol/L (ref 98–111)
Creatinine, Ser: 0.86 mg/dL (ref 0.44–1.00)
GFR, Estimated: >60 mL/min (ref >60)
Glucose, Bld: 103 mg/dL — ABNORMAL HIGH (ref 70–99)
Potassium: 3.8 mmol/L (ref 3.5–5.1)
Sodium: 140 mmol/L (ref 135–145)

## 2020-08-01 LAB — CBC
HCT: 36.4 % (ref 36.0–46.0)
Hemoglobin: 11.6 g/dL — ABNORMAL LOW (ref 12.0–15.0)
MCH: 24.2 pg — ABNORMAL LOW (ref 26.0–34.0)
MCHC: 31.9 g/dL (ref 30.0–36.0)
MCV: 75.8 fL — ABNORMAL LOW (ref 80.0–100.0)
Platelets: 291 10*3/uL (ref 150–400)
RBC: 4.8 MIL/uL (ref 3.87–5.11)
RDW: 16.5 % — ABNORMAL HIGH (ref 11.5–15.5)
WBC: 6.2 10*3/uL (ref 4.0–10.5)
nRBC: 0 % (ref 0.0–0.2)

## 2020-08-01 MED ORDER — ALBUTEROL SULFATE HFA 108 (90 BASE) MCG/ACT IN AERS
INHALATION_SPRAY | RESPIRATORY_TRACT | Status: AC
  Filled 2020-08-01: qty 6.7

## 2020-08-01 MED ORDER — ALBUTEROL SULFATE HFA 108 (90 BASE) MCG/ACT IN AERS: 1.0000 | INHALATION_SPRAY | Freq: Four times a day (QID) | RESPIRATORY_TRACT | 0 refills | Status: AC | PRN

## 2020-08-01 MED ORDER — ACETAMINOPHEN 325 MG PO TABS
ORAL_TABLET | ORAL | Status: AC
  Filled 2020-08-01: qty 3

## 2020-08-01 MED ORDER — ACETAMINOPHEN 325 MG PO TABS
975.0000 mg | ORAL_TABLET | Freq: Once | ORAL | Status: AC
  Administered 2020-08-01: 975 mg via ORAL

## 2020-08-01 MED ORDER — ACETAMINOPHEN 500 MG PO TABS: 1000.0000 mg | ORAL_TABLET | Freq: Three times a day (TID) | ORAL | 0 refills | Status: DC | PRN

## 2020-08-01 MED ORDER — CYCLOBENZAPRINE HCL 5 MG PO TABS: 5.0000 mg | ORAL_TABLET | Freq: Every day | ORAL | 0 refills | Status: DC

## 2020-08-01 MED ORDER — TRIAMCINOLONE ACETONIDE 0.1 % EX CREA: 1.0000 "application " | TOPICAL_CREAM | Freq: Two times a day (BID) | CUTANEOUS | 0 refills | Status: DC

## 2020-08-01 MED ORDER — ALBUTEROL SULFATE HFA 108 (90 BASE) MCG/ACT IN AERS
2.0000 | INHALATION_SPRAY | Freq: Once | RESPIRATORY_TRACT | Status: AC
  Administered 2020-08-01: 2 via RESPIRATORY_TRACT

## 2020-08-01 NOTE — ED Notes (Signed)
O2 96% HR 97 in lobby upon arrival.  Patient speaking in clear complete sentences.  NAD at this time.

## 2020-08-01 NOTE — ED Provider Notes (Signed)
[facility]    CSN: [id number] Arrival date & time: 08/01/20  1418      History   Chief Complaint Chief Complaint  Patient presents with  . Shortness of Breath    HPI Sheri Park is a 55 y.o. female.   Sheri Park presents with complaints of fatigue, body aches, neck ache, wheezing, shortness of breath , dry cough which started three days ago. She is out of her inhaler. She also complaints of itching rash to her low back, after taking mucinex. She hasn't taken her BP medications in the past two days. No gi symptoms. Outside of mucinex hasn't taken any medications for symptoms as she got too nervous to take any more, as she feels the rash was an allergic response to the mucinex. Denies any known covid-19 exposures, denies any previous covid-19, denies any previous vaccination for covid-19. She has a history of asthma and hypertension. Denies chest pain, but feels respiratory "tightness" with deep inspiration.    ROS per HPI, negative if not otherwise mentioned.      Past Medical History:  Diagnosis Date  . Asthma   . Hypertension     Patient Active Problem List   Diagnosis Date Noted  . Acute coronary syndrome (HCC) 10/18/2016  . Chest pain on exertion 10/17/2016    Past Surgical History:  Procedure Laterality Date  . KNEE SURGERY    . LEFT HEART CATH AND CORONARY ANGIOGRAPHY N/A 10/20/2016   Procedure: Left Heart Cath and Coronary Angiography;  Surgeon: Orpah Cobb, MD;  Location: [facility];  Service: Cardiovascular;  Laterality: N/A;  . TUBAL LIGATION      OB History   No obstetric history on file.      Home Medications    Prior to Admission medications   Medication Sig Start Date End Date Taking? Authorizing Provider  acetaminophen (TYLENOL) 500 MG tablet Take 2 tablets (1,000 mg total) by mouth every 8 (eight) hours as needed. 08/01/20  Yes Burky, Dorene Grebe B, NP  amLODipine (NORVASC) 5 MG tablet Take 1 tablet (5 mg total)  by mouth daily. 04/07/16  Yes [name], MD  cyclobenzaprine (FLEXERIL) 5 MG tablet Take 1 tablet (5 mg total) by mouth at bedtime. 08/01/20  Yes Burky, Dorene Grebe B, NP  hydrochlorothiazide (HYDRODIURIL) 12.5 MG tablet Take 12.5 mg by mouth daily.   Yes [provider]  albuterol (VENTOLIN HFA) 108 (90 Base) MCG/ACT inhaler Inhale 1-2 puffs into the lungs every 6 (six) hours as needed for wheezing or shortness of breath. 08/01/20   Georgetta Haber, NP  [facility] D3 25 MCG (1000 UT) capsule Take 1,000 Units by mouth daily. 01/12/19   [provider]  furosemide (LASIX) 40 MG tablet Take 40 mg by mouth. Takes on Monday, Wednesday, and friday    [provider]  lidocaine (LIDODERM) 5 % Place 1 patch onto the skin daily. Remove & Discard patch within 12 hours or as directed by MD Patient taking differently: Place 1 patch onto the skin daily as needed (pain). Remove & Discard patch within 12 hours or as directed by MD 08/20/18   Sabas Sous, MD  losartan (COZAAR) 25 MG tablet Take 50 mg by mouth daily. 01/12/19   [provider]  metroNIDAZOLE (FLAGYL) 500 MG tablet Take 1 tablet (500 mg total) by mouth 2 (two) times daily. 12/23/19   LampteyBritta Mccreedy, MD  vitamin C (ASCORBIC ACID) 250 MG tablet Take 250 mg by mouth  daily.    [provider]  ferrous sulfate 325 (65 FE) MG tablet Take 1 tablet (325 mg total) by mouth 2 (two) times daily with a meal. Patient not taking: Reported on 05/06/2019 10/21/16 12/22/19  Orpah Cobb, MD  fluticasone (FLOVENT HFA) 110 MCG/ACT inhaler Inhale 1 puff into the lungs 2 (two) times daily. Patient not taking: Reported on 05/06/2019 12/06/14 12/22/19  Linwood Dibbles, MD  potassium chloride (K-DUR) 10 MEQ tablet Take 10 mEq by mouth daily.  12/22/19  [provider]  promethazine (PHENERGAN) 25 MG tablet Take 1 tablet (25 mg total) by mouth every 6 (six) hours as needed for nausea or vomiting. Patient not taking: Reported on  08/20/2018 06/16/18 12/22/19  Rodriguez-Southworth, Nettie Elm, PA-C    Family History Family History  Problem Relation Age of Onset  . Diabetes Mother   . Cancer Mother   . Breast cancer Mother 75  . Breast cancer Maternal Aunt     Social History Social History   Tobacco Use  . Smoking status: Never Smoker  . Smokeless tobacco: Never Used  Vaping Use  . Vaping Use: Never used  Substance Use Topics  . Alcohol use: No  . Drug use: No     Allergies   Peanut-containing drug products, Shellfish allergy, Codeine, and Ketorolac tromethamine   Review of Systems Review of Systems   Physical Exam Triage Vital Signs ED Triage Vitals  Enc Vitals Group     BP 08/01/20 1437 (!) 190/122     Pulse Rate 08/01/20 1437 86     Resp 08/01/20 1437 (!) 24     Temp 08/01/20 1437 98.4 F (36.9 C)     Temp Source 08/01/20 1437 Oral     SpO2 08/01/20 1437 100 %     Weight 08/01/20 1438 (!) 357 lb (161.9 kg)     Height 08/01/20 1438 5\' 4"  (1.626 m)     Head Circumference --      Peak Flow --      Pain Score 08/01/20 1438 9     Pain Loc --      Pain Edu? --      Excl. in GC? --    No data found.  Updated Vital Signs BP (!) 190/122 (BP Location: Right Wrist)   Pulse 86   Temp 98.4 F (36.9 C) (Oral)   Resp (!) 24   Ht 5\' 4"  (1.626 m)   Wt (!) 336 lb 12.8 oz (152.8 kg)   LMP 12/15/2019   SpO2 100%   BMI 57.81 kg/m   Visual Acuity Right Eye Distance:   Left Eye Distance:   Bilateral Distance:    Right Eye Near:   Left Eye Near:    Bilateral Near:     Physical Exam Constitutional:      General: She is not in acute distress.    Appearance: She is well-developed. She is ill-appearing.  Cardiovascular:     Rate and Rhythm: Normal rate.  Pulmonary:     Effort: Pulmonary effort is normal. Tachypnea present. No accessory muscle usage or respiratory distress.  Skin:    General: Skin is warm and dry.  Neurological:     Mental Status: She is alert and oriented to person,  place, and time.      UC Treatments / Results  Labs (all labs ordered are listed, but only abnormal results are displayed) Labs Reviewed  SARS CORONAVIRUS 2 (TAT 6-24 HRS)    EKG   Radiology DG  Chest 2 View  Result Date: 08/01/2020 CLINICAL DATA:  Shortness of breath. EXAM: CHEST - 2 VIEW COMPARISON:  Chest x-ray 05/06/2019. FINDINGS: Mediastinum and hilar structures normal. Low lung volumes with mild bibasilar atelectasis. No pleural effusion or pneumothorax. IMPRESSION: Low lung volumes with mild bibasilar atelectasis. Electronically Signed   By: Maisie Fus  Register   On: 08/01/2020 08:14    Procedures Procedures (including critical care time)  Medications Ordered in UC Medications  albuterol (VENTOLIN HFA) 108 (90 Base) MCG/ACT inhaler 2 puff (has no administration in time range)  acetaminophen (TYLENOL) tablet 975 mg (has no administration in time range)    Initial Impression / Assessment and Plan / UC Course  I have reviewed the triage vital signs and the nursing notes.  Pertinent labs & imaging results that were available during my care of the patient were reviewed by me and considered in my medical decision making (see chart for details).     Labs and xray reviewed from er from earlier today without impressive findings. Mild tachypnea noted. Inhaler provided. Pain management and symptom control discussed. Emphasized taking BP medications once she is home. covid testing pending. Return precautions provided. Patient verbalized understanding and agreeable to plan.   Final Clinical Impressions(s) / UC Diagnoses   Final diagnoses:  Cough  Body aches  Muscle spasms of neck     Discharge Instructions     Self isolate until covid results are back.  We will notify you by phone if it is positive. Your negative results will be sent through your MyChart.    If it is positive you need to isolate from others for a total of 5 days. If no fever for 24 hours without  medications, and symptoms improving you may end isolation on day 6, but wear a mask if around any others for an additional 5 days.   Use of inhaler as needed for wheezing or shortness of breath.   Tylenol every 8 hours as needed for pain. Flexeril as needed for muscle spasms.  Please take your blood pressure medication.  Go to the ER for any worsening of symptoms.    ED Prescriptions    Medication Sig Dispense Auth. Provider   acetaminophen (TYLENOL) 500 MG tablet Take 2 tablets (1,000 mg total) by mouth every 8 (eight) hours as needed. 30 tablet Linus Mako B, NP   albuterol (VENTOLIN HFA) 108 (90 Base) MCG/ACT inhaler Inhale 1-2 puffs into the lungs every 6 (six) hours as needed for wheezing or shortness of breath. 8 g Linus Mako B, NP   cyclobenzaprine (FLEXERIL) 5 MG tablet Take 1 tablet (5 mg total) by mouth at bedtime. 15 tablet Georgetta Haber, NP     PDMP not reviewed this encounter.   Georgetta Haber, NP 08/01/20 1655

## 2020-08-01 NOTE — Discharge Instructions (Addendum)
Self isolate until covid results are back.  We will notify you by phone if it is positive. Your negative results will be sent through your MyChart.    If it is positive you need to isolate from others for a total of 5 days. If no fever for 24 hours without medications, and symptoms improving you may end isolation on day 6, but wear a mask if around any others for an additional 5 days.   Use of inhaler as needed for wheezing or shortness of breath.   Tylenol every 8 hours as needed for pain. Flexeril as needed for muscle spasms.  Please take your blood pressure medication.  Go to the ER for any worsening of symptoms.

## 2020-08-01 NOTE — ED Triage Notes (Signed)
Patient complains of a cough, neck and shoulder tightness and stiffness x2 days. Patient took mucinex for her symptoms and broke out in a rash, did not take any medication for the rash. States her neck is tight, tried war,m compress and is out of her muscle relaxer. Tested COVID negative last thursday.

## 2020-08-01 NOTE — ED Triage Notes (Addendum)
Pt c/o SOB, congestion for approx 3 days.   Denies fever, n/v/d, dysphagia, abdominal pain. Had negative COVID test on Thursday but did not have any symptoms at that time. States her children tested positive with symptoms on Thursday.  States she took Mucinex on Sunday and then experienced rash to arms and back, with itching.  Was waiting at [facility] ED since 0600 and had CXR and labs drawn. States she is out of her albuterol inhaler Notified K. Arlana Pouch of pt status and C/c, stable v/s and advised for pt to return to lobby and will be seen in order of time of arrival. Pt instructed if condition worsens, to notify staff.

## 2020-08-02 LAB — SARS CORONAVIRUS 2 (TAT 6-24 HRS): SARS Coronavirus 2: NEGATIVE

## 2020-08-05 ENCOUNTER — Other Ambulatory Visit: Payer: Self-pay

## 2020-08-05 ENCOUNTER — Inpatient Hospital Stay (HOSPITAL_COMMUNITY)
Admission: EM | Admit: 2020-08-05 | Discharge: 2020-08-10 | DRG: 871 | Disposition: A | Discharge status: Home / Self Care | Payer: Medicaid Other | Attending: Internal Medicine | Admitting: Internal Medicine

## 2020-08-05 ENCOUNTER — Encounter (HOSPITAL_COMMUNITY): Payer: Self-pay | Admitting: Emergency Medicine

## 2020-08-05 ENCOUNTER — Inpatient Hospital Stay (HOSPITAL_COMMUNITY): Payer: Medicaid Other

## 2020-08-05 ENCOUNTER — Other Ambulatory Visit (HOSPITAL_COMMUNITY): Payer: Self-pay

## 2020-08-05 ENCOUNTER — Emergency Department (HOSPITAL_COMMUNITY): Payer: Medicaid Other

## 2020-08-05 DIAGNOSIS — I480 Paroxysmal atrial fibrillation: Secondary | ICD-10-CM | POA: Diagnosis present

## 2020-08-05 DIAGNOSIS — Z6841 Body Mass Index (BMI) 40.0 and over, adult: Secondary | ICD-10-CM | POA: Diagnosis not present

## 2020-08-05 DIAGNOSIS — A419 Sepsis, unspecified organism: Secondary | ICD-10-CM | POA: Diagnosis not present

## 2020-08-05 DIAGNOSIS — R0602 Shortness of breath: Secondary | ICD-10-CM

## 2020-08-05 DIAGNOSIS — D509 Iron deficiency anemia, unspecified: Secondary | ICD-10-CM | POA: Diagnosis not present

## 2020-08-05 DIAGNOSIS — I214 Non-ST elevation (NSTEMI) myocardial infarction: Secondary | ICD-10-CM | POA: Diagnosis not present

## 2020-08-05 DIAGNOSIS — Y92239 Unspecified place in hospital as the place of occurrence of the external cause: Secondary | ICD-10-CM | POA: Diagnosis not present

## 2020-08-05 DIAGNOSIS — I517 Cardiomegaly: Secondary | ICD-10-CM | POA: Diagnosis not present

## 2020-08-05 DIAGNOSIS — J1282 Pneumonia due to coronavirus disease 2019: Secondary | ICD-10-CM | POA: Diagnosis present

## 2020-08-05 DIAGNOSIS — Z803 Family history of malignant neoplasm of breast: Secondary | ICD-10-CM

## 2020-08-05 DIAGNOSIS — E876 Hypokalemia: Secondary | ICD-10-CM | POA: Diagnosis present

## 2020-08-05 DIAGNOSIS — R509 Fever, unspecified: Secondary | ICD-10-CM | POA: Diagnosis not present

## 2020-08-05 DIAGNOSIS — J9601 Acute respiratory failure with hypoxia: Secondary | ICD-10-CM | POA: Diagnosis present

## 2020-08-05 DIAGNOSIS — L270 Generalized skin eruption due to drugs and medicaments taken internally: Secondary | ICD-10-CM | POA: Diagnosis present

## 2020-08-05 DIAGNOSIS — U071 COVID-19: Secondary | ICD-10-CM | POA: Diagnosis present

## 2020-08-05 DIAGNOSIS — I4891 Unspecified atrial fibrillation: Secondary | ICD-10-CM | POA: Diagnosis not present

## 2020-08-05 DIAGNOSIS — I110 Hypertensive heart disease with heart failure: Secondary | ICD-10-CM | POA: Diagnosis present

## 2020-08-05 DIAGNOSIS — T380X5A Adverse effect of glucocorticoids and synthetic analogues, initial encounter: Secondary | ICD-10-CM | POA: Diagnosis not present

## 2020-08-05 DIAGNOSIS — I10 Essential (primary) hypertension: Secondary | ICD-10-CM | POA: Diagnosis not present

## 2020-08-05 DIAGNOSIS — I071 Rheumatic tricuspid insufficiency: Secondary | ICD-10-CM | POA: Diagnosis present

## 2020-08-05 DIAGNOSIS — I313 Pericardial effusion (noninflammatory): Secondary | ICD-10-CM | POA: Diagnosis not present

## 2020-08-05 DIAGNOSIS — L509 Urticaria, unspecified: Secondary | ICD-10-CM

## 2020-08-05 DIAGNOSIS — E8809 Other disorders of plasma-protein metabolism, not elsewhere classified: Secondary | ICD-10-CM | POA: Diagnosis not present

## 2020-08-05 DIAGNOSIS — Z885 Allergy status to narcotic agent status: Secondary | ICD-10-CM

## 2020-08-05 DIAGNOSIS — R072 Precordial pain: Secondary | ICD-10-CM | POA: Diagnosis not present

## 2020-08-05 DIAGNOSIS — I272 Pulmonary hypertension, unspecified: Secondary | ICD-10-CM | POA: Diagnosis present

## 2020-08-05 DIAGNOSIS — Z91010 Allergy to peanuts: Secondary | ICD-10-CM | POA: Diagnosis not present

## 2020-08-05 DIAGNOSIS — Z91013 Allergy to seafood: Secondary | ICD-10-CM

## 2020-08-05 DIAGNOSIS — I454 Nonspecific intraventricular block: Secondary | ICD-10-CM | POA: Diagnosis present

## 2020-08-05 DIAGNOSIS — Y929 Unspecified place or not applicable: Secondary | ICD-10-CM

## 2020-08-05 DIAGNOSIS — R609 Edema, unspecified: Secondary | ICD-10-CM

## 2020-08-05 DIAGNOSIS — Z79899 Other long term (current) drug therapy: Secondary | ICD-10-CM | POA: Diagnosis not present

## 2020-08-05 DIAGNOSIS — J9811 Atelectasis: Secondary | ICD-10-CM | POA: Diagnosis not present

## 2020-08-05 DIAGNOSIS — E1165 Type 2 diabetes mellitus with hyperglycemia: Secondary | ICD-10-CM | POA: Diagnosis not present

## 2020-08-05 DIAGNOSIS — I5031 Acute diastolic (congestive) heart failure: Secondary | ICD-10-CM | POA: Diagnosis present

## 2020-08-05 DIAGNOSIS — I248 Other forms of acute ischemic heart disease: Secondary | ICD-10-CM | POA: Diagnosis not present

## 2020-08-05 DIAGNOSIS — K449 Diaphragmatic hernia without obstruction or gangrene: Secondary | ICD-10-CM | POA: Diagnosis not present

## 2020-08-05 DIAGNOSIS — R079 Chest pain, unspecified: Secondary | ICD-10-CM

## 2020-08-05 DIAGNOSIS — N179 Acute kidney failure, unspecified: Secondary | ICD-10-CM | POA: Diagnosis not present

## 2020-08-05 DIAGNOSIS — I409 Acute myocarditis, unspecified: Secondary | ICD-10-CM | POA: Diagnosis present

## 2020-08-05 DIAGNOSIS — I471 Supraventricular tachycardia: Secondary | ICD-10-CM | POA: Diagnosis present

## 2020-08-05 DIAGNOSIS — Z833 Family history of diabetes mellitus: Secondary | ICD-10-CM | POA: Diagnosis not present

## 2020-08-05 DIAGNOSIS — I308 Other forms of acute pericarditis: Secondary | ICD-10-CM | POA: Diagnosis not present

## 2020-08-05 DIAGNOSIS — T484X5A Adverse effect of expectorants, initial encounter: Secondary | ICD-10-CM | POA: Diagnosis present

## 2020-08-05 DIAGNOSIS — R059 Cough, unspecified: Secondary | ICD-10-CM | POA: Diagnosis not present

## 2020-08-05 DIAGNOSIS — R5383 Other fatigue: Secondary | ICD-10-CM | POA: Diagnosis not present

## 2020-08-05 DIAGNOSIS — Z23 Encounter for immunization: Secondary | ICD-10-CM

## 2020-08-05 DIAGNOSIS — R0789 Other chest pain: Secondary | ICD-10-CM | POA: Diagnosis not present

## 2020-08-05 DIAGNOSIS — Z9109 Other allergy status, other than to drugs and biological substances: Secondary | ICD-10-CM | POA: Diagnosis not present

## 2020-08-05 DIAGNOSIS — R7989 Other specified abnormal findings of blood chemistry: Secondary | ICD-10-CM | POA: Diagnosis present

## 2020-08-05 DIAGNOSIS — J45909 Unspecified asthma, uncomplicated: Secondary | ICD-10-CM | POA: Diagnosis present

## 2020-08-05 DIAGNOSIS — E860 Dehydration: Secondary | ICD-10-CM | POA: Diagnosis present

## 2020-08-05 LAB — COMPREHENSIVE METABOLIC PANEL
ALT: 18 U/L (ref 0–44)
AST: 27 U/L (ref 15–41)
Albumin: 2.8 g/dL — ABNORMAL LOW (ref 3.5–5.0)
Alkaline Phosphatase: 47 U/L (ref 38–126)
Anion gap: 11 (ref 5–15)
BUN: 11 mg/dL (ref 6–20)
CO2: 22 mmol/L (ref 22–32)
Calcium: 8.1 mg/dL — ABNORMAL LOW (ref 8.9–10.3)
Chloride: 101 mmol/L (ref 98–111)
Creatinine, Ser: 1.4 mg/dL — ABNORMAL HIGH (ref 0.44–1.00)
GFR, Estimated: 45 mL/min — ABNORMAL LOW (ref >60)
Glucose, Bld: 156 mg/dL — ABNORMAL HIGH (ref 70–99)
Potassium: 3 mmol/L — ABNORMAL LOW (ref 3.5–5.1)
Sodium: 134 mmol/L — ABNORMAL LOW (ref 135–145)
Total Bilirubin: 1.1 mg/dL (ref 0.3–1.2)
Total Protein: 6.8 g/dL (ref 6.5–8.1)

## 2020-08-05 LAB — C-REACTIVE PROTEIN: CRP: 20 mg/dL — ABNORMAL HIGH (ref <1.0)

## 2020-08-05 LAB — LACTIC ACID, PLASMA
Lactic Acid, Venous: 1.8 mmol/L (ref 0.5–1.9)
Lactic Acid, Venous: 1 mmol/L (ref 0.5–1.9)

## 2020-08-05 LAB — URINALYSIS, ROUTINE W REFLEX MICROSCOPIC
Bilirubin Urine: NEGATIVE
Glucose, UA: NEGATIVE mg/dL
Ketones, ur: NEGATIVE mg/dL
Leukocytes,Ua: NEGATIVE
Nitrite: NEGATIVE
Protein, ur: 100 mg/dL — AB
RBC / HPF: >50 RBC/hpf — ABNORMAL HIGH (ref 0–5)
Specific Gravity, Urine: 1.017 (ref 1.005–1.030)
WBC, UA: >50 WBC/hpf — ABNORMAL HIGH (ref 0–5)
pH: 5 (ref 5.0–8.0)

## 2020-08-05 LAB — BASIC METABOLIC PANEL
Anion gap: 11 (ref 5–15)
BUN: 11 mg/dL (ref 6–20)
CO2: 23 mmol/L (ref 22–32)
Calcium: 8 mg/dL — ABNORMAL LOW (ref 8.9–10.3)
Chloride: 102 mmol/L (ref 98–111)
Creatinine, Ser: 1.18 mg/dL — ABNORMAL HIGH (ref 0.44–1.00)
GFR, Estimated: 55 mL/min — ABNORMAL LOW (ref >60)
Glucose, Bld: 113 mg/dL — ABNORMAL HIGH (ref 70–99)
Potassium: 3.3 mmol/L — ABNORMAL LOW (ref 3.5–5.1)
Sodium: 136 mmol/L (ref 135–145)

## 2020-08-05 LAB — HCG, SERUM, QUALITATIVE: Preg, Serum: NEGATIVE

## 2020-08-05 LAB — CBC
HCT: 32.1 % — ABNORMAL LOW (ref 36.0–46.0)
Hemoglobin: 9.8 g/dL — ABNORMAL LOW (ref 12.0–15.0)
MCH: 23.8 pg — ABNORMAL LOW (ref 26.0–34.0)
MCHC: 30.5 g/dL (ref 30.0–36.0)
MCV: 78.1 fL — ABNORMAL LOW (ref 80.0–100.0)
Platelets: 258 10*3/uL (ref 150–400)
RBC: 4.11 MIL/uL (ref 3.87–5.11)
RDW: 17.5 % — ABNORMAL HIGH (ref 11.5–15.5)
WBC: 9.3 10*3/uL (ref 4.0–10.5)
nRBC: 0 % (ref 0.0–0.2)

## 2020-08-05 LAB — CBC WITH DIFFERENTIAL/PLATELET
Abs Immature Granulocytes: 0.07 10*3/uL (ref 0.00–0.07)
Basophils Absolute: 0 10*3/uL (ref 0.0–0.1)
Basophils Relative: 0 %
Eosinophils Absolute: 0.3 10*3/uL (ref 0.0–0.5)
Eosinophils Relative: 2 %
HCT: 31.2 % — ABNORMAL LOW (ref 36.0–46.0)
Hemoglobin: 10.2 g/dL — ABNORMAL LOW (ref 12.0–15.0)
Immature Granulocytes: 1 %
Lymphocytes Relative: 18 %
Lymphs Abs: 2 10*3/uL (ref 0.7–4.0)
MCH: 24.8 pg — ABNORMAL LOW (ref 26.0–34.0)
MCHC: 32.7 g/dL (ref 30.0–36.0)
MCV: 75.7 fL — ABNORMAL LOW (ref 80.0–100.0)
Monocytes Absolute: 0.8 10*3/uL (ref 0.1–1.0)
Monocytes Relative: 7 %
Neutro Abs: 7.9 10*3/uL — ABNORMAL HIGH (ref 1.7–7.7)
Neutrophils Relative %: 72 %
Platelets: 275 10*3/uL (ref 150–400)
RBC: 4.12 MIL/uL (ref 3.87–5.11)
RDW: 17.4 % — ABNORMAL HIGH (ref 11.5–15.5)
WBC: 11.1 10*3/uL — ABNORMAL HIGH (ref 4.0–10.5)
nRBC: 0 % (ref 0.0–0.2)

## 2020-08-05 LAB — BRAIN NATRIURETIC PEPTIDE: B Natriuretic Peptide: 113 pg/mL — ABNORMAL HIGH (ref 0.0–100.0)

## 2020-08-05 LAB — PROTIME-INR
INR: 1.1 (ref 0.8–1.2)
Prothrombin Time: 13.7 seconds (ref 11.4–15.2)

## 2020-08-05 LAB — I-STAT BETA HCG BLOOD, ED (MC, WL, AP ONLY): I-stat hCG, quantitative: 18.3 m[IU]/mL — ABNORMAL HIGH (ref <5)

## 2020-08-05 LAB — TROPONIN I (HIGH SENSITIVITY)
Troponin I (High Sensitivity): 1248 ng/L (ref <18)
Troponin I (High Sensitivity): 1029 ng/L (ref <18)

## 2020-08-05 LAB — HEPARIN LEVEL (UNFRACTIONATED): Heparin Unfractionated: <0.1 IU/mL — ABNORMAL LOW (ref 0.30–0.70)

## 2020-08-05 LAB — D-DIMER, QUANTITATIVE: D-Dimer, Quant: 6.68 ug/mL-FEU — ABNORMAL HIGH (ref 0.00–0.50)

## 2020-08-05 LAB — MAGNESIUM: Magnesium: 1.8 mg/dL (ref 1.7–2.4)

## 2020-08-05 LAB — APTT: aPTT: 39 seconds — ABNORMAL HIGH (ref 24–36)

## 2020-08-05 LAB — HIV ANTIBODY (ROUTINE TESTING W REFLEX): HIV Screen 4th Generation wRfx: NONREACTIVE

## 2020-08-05 LAB — TSH: TSH: 1.264 u[IU]/mL (ref 0.350–4.500)

## 2020-08-05 LAB — POC SARS CORONAVIRUS 2 AG -  ED: SARS Coronavirus 2 Ag: NEGATIVE

## 2020-08-05 LAB — PROCALCITONIN: Procalcitonin: 0.24 ng/mL

## 2020-08-05 MED ORDER — SODIUM CHLORIDE 0.9 % IV SOLN
200.0000 mg | Freq: Once | INTRAVENOUS | Status: AC
  Administered 2020-08-05: 200 mg via INTRAVENOUS
  Filled 2020-08-05: qty 40
  Filled 2020-08-05: qty 200

## 2020-08-05 MED ORDER — SODIUM CHLORIDE 0.9 % IV SOLN
2.0000 g | Freq: Once | INTRAVENOUS | Status: AC
  Administered 2020-08-05: 2 g via INTRAVENOUS
  Filled 2020-08-05: qty 2

## 2020-08-05 MED ORDER — BENZONATATE 100 MG PO CAPS
200.0000 mg | ORAL_CAPSULE | Freq: Three times a day (TID) | ORAL | Status: AC
  Administered 2020-08-05 (×3): 200 mg via ORAL
  Filled 2020-08-05 (×3): qty 2

## 2020-08-05 MED ORDER — MORPHINE SULFATE (PF) 2 MG/ML IV SOLN
1.0000 mg | INTRAVENOUS | Status: AC | PRN
  Administered 2020-08-05 (×2): 1 mg via INTRAVENOUS
  Filled 2020-08-05 (×2): qty 1

## 2020-08-05 MED ORDER — ACETAMINOPHEN 325 MG PO TABS
650.0000 mg | ORAL_TABLET | Freq: Four times a day (QID) | ORAL | Status: DC | PRN
  Administered 2020-08-05 – 2020-08-09 (×5): 650 mg via ORAL
  Filled 2020-08-05 (×5): qty 2

## 2020-08-05 MED ORDER — HEPARIN BOLUS VIA INFUSION
4000.0000 [IU] | Freq: Once | INTRAVENOUS | Status: AC
  Administered 2020-08-05: 4000 [IU] via INTRAVENOUS
  Filled 2020-08-05: qty 4000

## 2020-08-05 MED ORDER — IOHEXOL 350 MG/ML SOLN
75.0000 mL | Freq: Once | INTRAVENOUS | Status: AC | PRN
  Administered 2020-08-05: 75 mL via INTRAVENOUS

## 2020-08-05 MED ORDER — PANTOPRAZOLE SODIUM 40 MG PO TBEC
40.0000 mg | DELAYED_RELEASE_TABLET | Freq: Every day | ORAL | Status: DC
  Administered 2020-08-05 – 2020-08-10 (×6): 40 mg via ORAL
  Filled 2020-08-05 (×6): qty 1

## 2020-08-05 MED ORDER — METRONIDAZOLE IN NACL 5-0.79 MG/ML-% IV SOLN
500.0000 mg | Freq: Once | INTRAVENOUS | Status: AC
  Administered 2020-08-05: 500 mg via INTRAVENOUS
  Filled 2020-08-05: qty 100

## 2020-08-05 MED ORDER — LACTATED RINGERS IV BOLUS
2000.0000 mL | Freq: Once | INTRAVENOUS | Status: AC
  Administered 2020-08-05: 2000 mL via INTRAVENOUS

## 2020-08-05 MED ORDER — VANCOMYCIN HCL 1500 MG/300ML IV SOLN: 1500.0000 mg | INTRAVENOUS | Status: DC

## 2020-08-05 MED ORDER — SODIUM CHLORIDE 0.9 % IV SOLN
100.0000 mg | Freq: Every day | INTRAVENOUS | Status: AC
  Administered 2020-08-06 – 2020-08-09 (×4): 100 mg via INTRAVENOUS
  Filled 2020-08-05 (×2): qty 20
  Filled 2020-08-05: qty 100
  Filled 2020-08-05: qty 20

## 2020-08-05 MED ORDER — LACTATED RINGERS IV BOLUS
1000.0000 mL | Freq: Once | INTRAVENOUS | Status: AC
  Administered 2020-08-05: 1000 mL via INTRAVENOUS

## 2020-08-05 MED ORDER — LACTATED RINGERS IV SOLN: INTRAVENOUS | Status: DC

## 2020-08-05 MED ORDER — DILTIAZEM LOAD VIA INFUSION
20.0000 mg | Freq: Once | INTRAVENOUS | Status: AC
  Administered 2020-08-05: 20 mg via INTRAVENOUS
  Filled 2020-08-05: qty 20

## 2020-08-05 MED ORDER — DIGOXIN 0.25 MG/ML IJ SOLN
0.2500 mg | Freq: Once | INTRAMUSCULAR | Status: AC
  Administered 2020-08-05: 0.25 mg via INTRAVENOUS

## 2020-08-05 MED ORDER — VANCOMYCIN HCL IN DEXTROSE 1-5 GM/200ML-% IV SOLN: 1000.0000 mg | Freq: Once | INTRAVENOUS | Status: DC

## 2020-08-05 MED ORDER — METRONIDAZOLE IN NACL 5-0.79 MG/ML-% IV SOLN
500.0000 mg | Freq: Three times a day (TID) | INTRAVENOUS | Status: DC
  Administered 2020-08-05: 500 mg via INTRAVENOUS
  Filled 2020-08-05: qty 100

## 2020-08-05 MED ORDER — ENOXAPARIN SODIUM 40 MG/0.4ML ~~LOC~~ SOLN: 40.0000 mg | SUBCUTANEOUS | Status: DC

## 2020-08-05 MED ORDER — LORATADINE 10 MG PO TABS
10.0000 mg | ORAL_TABLET | Freq: Every day | ORAL | Status: DC
  Administered 2020-08-05 – 2020-08-10 (×7): 10 mg via ORAL
  Filled 2020-08-05 (×6): qty 1

## 2020-08-05 MED ORDER — METOPROLOL TARTRATE 12.5 MG HALF TABLET
12.5000 mg | ORAL_TABLET | Freq: Two times a day (BID) | ORAL | Status: DC
  Administered 2020-08-05 – 2020-08-10 (×12): 12.5 mg via ORAL
  Filled 2020-08-05 (×11): qty 1

## 2020-08-05 MED ORDER — POTASSIUM CHLORIDE CRYS ER 20 MEQ PO TBCR
40.0000 meq | EXTENDED_RELEASE_TABLET | Freq: Once | ORAL | Status: AC
  Administered 2020-08-05: 40 meq via ORAL
  Filled 2020-08-05: qty 2

## 2020-08-05 MED ORDER — METHYLPREDNISOLONE SODIUM SUCC 125 MG IJ SOLR
60.0000 mg | Freq: Two times a day (BID) | INTRAMUSCULAR | Status: DC
  Administered 2020-08-05 – 2020-08-07 (×4): 60 mg via INTRAVENOUS
  Filled 2020-08-05 (×4): qty 2

## 2020-08-05 MED ORDER — DILTIAZEM HCL-DEXTROSE 125-5 MG/125ML-% IV SOLN (PREMIX)
5.0000 mg/h | INTRAVENOUS | Status: DC
  Administered 2020-08-05: 15 mg/h via INTRAVENOUS
  Administered 2020-08-05: 5 mg/h via INTRAVENOUS
  Filled 2020-08-05 (×4): qty 125

## 2020-08-05 MED ORDER — METOPROLOL TARTRATE 5 MG/5ML IV SOLN
5.0000 mg | Freq: Once | INTRAVENOUS | Status: AC
  Administered 2020-08-05: 5 mg via INTRAVENOUS
  Filled 2020-08-05: qty 5

## 2020-08-05 MED ORDER — SODIUM CHLORIDE 0.9 % IV SOLN
2.0000 g | Freq: Three times a day (TID) | INTRAVENOUS | Status: DC
  Administered 2020-08-05: 2 g via INTRAVENOUS
  Filled 2020-08-05: qty 2

## 2020-08-05 MED ORDER — VANCOMYCIN HCL 10 G IV SOLR
2500.0000 mg | Freq: Once | INTRAVENOUS | Status: AC
  Administered 2020-08-05: 2500 mg via INTRAVENOUS
  Filled 2020-08-05: qty 2500

## 2020-08-05 MED ORDER — HYDROCOD POLST-CPM POLST ER 10-8 MG/5ML PO SUER
5.0000 mL | Freq: Two times a day (BID) | ORAL | Status: DC | PRN
  Administered 2020-08-05 – 2020-08-08 (×5): 5 mL via ORAL
  Filled 2020-08-05 (×5): qty 5

## 2020-08-05 MED ORDER — ALBUTEROL SULFATE (2.5 MG/3ML) 0.083% IN NEBU
2.5000 mg | INHALATION_SOLUTION | Freq: Four times a day (QID) | RESPIRATORY_TRACT | Status: DC | PRN
  Administered 2020-08-05: 2.5 mg via RESPIRATORY_TRACT
  Filled 2020-08-05: qty 3

## 2020-08-05 MED ORDER — HEPARIN (PORCINE) 25000 UT/250ML-% IV SOLN
1800.0000 [IU]/h | INTRAVENOUS | Status: DC
  Administered 2020-08-05 (×2): 1400 [IU]/h via INTRAVENOUS
  Filled 2020-08-05 (×2): qty 250

## 2020-08-05 MED ORDER — ACETAMINOPHEN 650 MG RE SUPP: 650.0000 mg | Freq: Four times a day (QID) | RECTAL | Status: DC | PRN

## 2020-08-05 MED ORDER — IBUPROFEN 400 MG PO TABS
400.0000 mg | ORAL_TABLET | Freq: Once | ORAL | Status: AC
  Administered 2020-08-05: 400 mg via ORAL
  Filled 2020-08-05: qty 1

## 2020-08-05 NOTE — Progress Notes (Signed)
ANTICOAGULATION CONSULT NOTE - Initial Consult  Pharmacy Consult for heparin Indication: chest pain/ACS and atrial fibrillation  Allergies  Allergen Reactions  . Peanut-Containing Drug Products Anaphylaxis    Oil, nuts, anything peanut related  . Shellfish Allergy Anaphylaxis  . Codeine Itching  . Ketorolac Tromethamine Swelling  . Toradol [Ketorolac Tromethamine] Swelling  . Mucinex [Guaifenesin Er] Rash    Patient Measurements: Height: 5\' 4"  (162.6 cm) Weight: (!) 152.8 kg (336 lb 13.8 oz) IBW/kg (Calculated) : 54.7 Heparin Dosing Weight: 95g  Vital Signs: Temp: 99.6 F (37.6 C) (01/23 0049) Temp Source: Oral (01/23 0049) BP: 91/66 (01/23 0630) Pulse Rate: 121 (01/23 0630)  Labs: Recent Labs    08/05/20 0115 08/05/20 0422  HGB 10.2*  --   HCT 31.2*  --   PLT 275  --   APTT 39*  --   LABPROT 13.7  --   INR 1.1  --   CREATININE 1.40*  --   TROPONINIHS  --  1,248*    Estimated Creatinine Clearance: 68.9 mL/min (A) (by C-G formula based on SCr of 1.4 mg/dL (H)).   Medical History: Past Medical History:  Diagnosis Date  . Asthma   . Hypertension     Assessment: 55yo female w/ recent Covid infection (initially positive 1/10, now testing negative) c/o SOB and CP; found to be in Afib with elevated troponin, to begin heparin.  Goal of Therapy:  Heparin level 0.3-0.7 units/ml Monitor platelets by anticoagulation protocol: Yes   Plan:  Will give heparin 4000 units IV bolus x1 followed by gtt at 1400 units/hr and monitor heparin levels and CBC.  55yo, PharmD, BCPS  08/05/2020,7:04 AM

## 2020-08-05 NOTE — Progress Notes (Signed)
Pharmacy Antibiotic Note  Sheri Park is a 55 y.o. female admitted on 08/05/2020 with sepsis.  Pharmacy has been consulted for vancomycin and cefepime dosing.  Pt w/ AKI likely 2/2 dehydration, baseline SCr 0.8, now 1.4.  Plan: Vancomycin 2500mg  x1 then 1500mg  IV Q24H. Goal AUC 400-550.   Expected AUC 520.  SCr used 1.4.  Monitor for improvement.  Cefepime 2g IV Q8H.  Height: 5\' 4"  (162.6 cm) Weight: (!) 152.8 kg (336 lb 13.8 oz) IBW/kg (Calculated) : 54.7  Temp (24hrs), Avg:99.6 F (37.6 C), Min:99.6 F (37.6 C), Max:99.6 F (37.6 C)  Recent Labs  Lab 08/01/20 0818 08/05/20 0115 08/05/20 0123 08/05/20 0424  WBC 6.2 11.1*  --   --   CREATININE 0.86 1.40*  --   --   LATICACIDVEN  --   --  1.8 1.0    Estimated Creatinine Clearance: 68.9 mL/min (A) (by C-G formula based on SCr of 1.4 mg/dL (H)).    Allergies  Allergen Reactions  . Peanut-Containing Drug Products Anaphylaxis    Oil, nuts, anything peanut related  . Shellfish Allergy Anaphylaxis  . Codeine Itching  . Ketorolac Tromethamine Swelling  . Toradol [Ketorolac Tromethamine] Swelling  . Mucinex [Guaifenesin Er] Rash    Thank you for allowing pharmacy to be a part of this patient's care.  08/07/20, PharmD, BCPS  08/05/2020 7:19 AM

## 2020-08-05 NOTE — Consult Note (Signed)
Referring Physician: Ernestene Kiel, MD  Sheri Park is an 55 y.o. female.                       Chief Complaint: Shortness of breath and atrial fibrillation with RVR  HPI: 55 years old black female with h/o asthma, hypertension, morbid obesity, iron deficiency anemia has cough, congestion, shortness of breath and COVID 19 infection. Her chest x-ray is clear but her monitor shows atrial fibrillation with RVR. She used Mucinex that gave her rash.  She is on Cardizem drip with little drop in heart rate to 130's.  She also has chest pain and elevated troponin I at 1248 ng. Her D-dimer is elevated. Her CRP is also elevated at 20.00 mg/dL. Her BNP is mildly elevated. CT angio chest result is pending. EKG shows atrial fibrillation with inferior ischemia and possible high lateral acute MI.   Past Medical History:  Diagnosis Date  . Asthma   . Hypertension       Past Surgical History:  Procedure Laterality Date  . KNEE SURGERY    . LEFT HEART CATH AND CORONARY ANGIOGRAPHY N/A 10/20/2016   Procedure: Left Heart Cath and Coronary Angiography;  Surgeon: Dixie Dials, MD;  Location: [facility];  Service: Cardiovascular;  Laterality: N/A;  . TUBAL LIGATION      Family History  Problem Relation Age of Onset  . Diabetes Mother   . Cancer Mother   . Breast cancer Mother 78  . Breast cancer Maternal Aunt    Social History:  reports that she has never smoked. She has never used smokeless tobacco. She reports that she does not drink alcohol and does not use drugs.  Allergies:  Allergies  Allergen Reactions  . Peanut-Containing Drug Products Anaphylaxis    Oil, nuts, anything peanut related  . Shellfish Allergy Anaphylaxis  . Codeine Itching  . Ketorolac Tromethamine Swelling  . Toradol [Ketorolac Tromethamine] Swelling  . Mucinex [Guaifenesin Er] Rash    (Not in a hospital admission)   Results for orders placed or performed during the hospital encounter of 08/05/20 (from the  past 48 hour(s))  Comprehensive metabolic panel     Status: Abnormal   Collection Time: 08/05/20  1:15 AM  Result Value Ref Range   Sodium 134 (L) 135 - 145 mmol/L   Potassium 3.0 (L) 3.5 - 5.1 mmol/L   Chloride 101 98 - 111 mmol/L   CO2 22 22 - 32 mmol/L   Glucose, Bld 156 (H) 70 - 99 mg/dL    Comment: Glucose reference range applies only to samples taken after fasting for at least 8 hours.   BUN 11 6 - 20 mg/dL   Creatinine, Ser 1.40 (H) 0.44 - 1.00 mg/dL   Calcium 8.1 (L) 8.9 - 10.3 mg/dL   Total Protein 6.8 6.5 - 8.1 g/dL   Albumin 2.8 (L) 3.5 - 5.0 g/dL   AST 27 15 - 41 U/L   ALT 18 0 - 44 U/L   Alkaline Phosphatase 47 38 - 126 U/L   Total Bilirubin 1.1 0.3 - 1.2 mg/dL   GFR, Estimated 45 (L) >60 mL/min    Comment: (NOTE) Calculated using the CKD-EPI Creatinine Equation (2021)    Anion gap 11 5 - 15    Comment: Performed at [facility], [place] [address] [place], [place] [address]  CBC WITH DIFFERENTIAL     Status: Abnormal   Collection Time: 08/05/20  1:15 AM  Result Value Ref Range   WBC 11.1 (H) 4.0 - 10.5 K/uL   RBC 4.12 3.87 - 5.11 MIL/uL   Hemoglobin 10.2 (L) 12.0 - 15.0 g/dL   HCT 31.2 (L) 36.0 - 46.0 %   MCV 75.7 (L) 80.0 - 100.0 fL   MCH 24.8 (L) 26.0 - 34.0 pg   MCHC 32.7 30.0 - 36.0 g/dL   RDW 17.4 (H) 11.5 - 15.5 %   Platelets 275 150 - 400 K/uL   nRBC 0.0 0.0 - 0.2 %   Neutrophils Relative % 72 %   Neutro Abs 7.9 (H) 1.7 - 7.7 K/uL   Lymphocytes Relative 18 %   Lymphs Abs 2.0 0.7 - 4.0 K/uL   Monocytes Relative 7 %   Monocytes Absolute 0.8 0.1 - 1.0 K/uL   Eosinophils Relative 2 %   Eosinophils Absolute 0.3 0.0 - 0.5 K/uL   Basophils Relative 0 %   Basophils Absolute 0.0 0.0 - 0.1 K/uL   Immature Granulocytes 1 %   Abs Immature Granulocytes 0.07 0.00 - 0.07 K/uL    Comment: Performed at [facility], [address], [place], [place] [address]  Protime-INR     Status: None   Collection Time: 08/05/20  1:15 AM  Result Value Ref Range    Prothrombin Time 13.7 11.4 - 15.2 seconds   INR 1.1 0.8 - 1.2    Comment: (NOTE) INR goal varies based on device and disease states. Performed at [facility], [place] [address] [place], [place] [address]   APTT     Status: Abnormal   Collection Time: 08/05/20  1:15 AM  Result Value Ref Range   aPTT 39 (H) 24 - 36 seconds    Comment:        IF BASELINE aPTT IS ELEVATED, SUGGEST PATIENT RISK ASSESSMENT BE USED TO DETERMINE APPROPRIATE ANTICOAGULANT THERAPY. Performed at [facility], [place] [address] [place], [place] [address]   Blood Culture (routine x 2)     Status: None (Preliminary result)   Collection Time: 08/05/20  1:15 AM   Specimen: BLOOD  Result Value Ref Range   Specimen Description BLOOD RIGHT ANTECUBITAL    Special Requests      BOTTLES DRAWN AEROBIC AND ANAEROBIC Blood Culture adequate volume   Culture      NO GROWTH < 12 HOURS Performed at [facility], [place] [address] [place],  [address]    Report Status PENDING   I-Stat beta hCG blood, ED     Status: Abnormal   Collection Time: 08/05/20  1:22 AM  Result Value Ref Range   I-stat hCG, quantitative 18.3 (H) <5 mIU/mL   Comment 3            Comment:   GEST. AGE      CONC.  (mIU/mL)   <=1 WEEK        5 - 50     2 WEEKS       50 - 500     3 WEEKS       100 - 10,000     4 WEEKS     1,000 - 30,000        FEMALE AND NON-PREGNANT FEMALE:     LESS THAN 5 mIU/mL   Lactic acid, plasma     Status: None   Collection Time: 08/05/20  1:23 AM  Result Value Ref Range   Lactic Acid, Venous 1.8 0.5 - 1.9 mmol/L  Comment: Performed at [facility], [place] [address], [place], [place] [address]  POC SARS Coronavirus 2 Ag-ED - Nasal Swab (BD Veritor Kit)     Status: None   Collection Time: 08/05/20  1:38 AM  Result Value Ref Range   SARS Coronavirus 2 Ag NEGATIVE NEGATIVE    Comment: (NOTE) SARS-CoV-2 antigen NOT DETECTED.   Negative results are presumptive.  Negative results do not  preclude SARS-CoV-2 infection and should not be used as the sole basis for treatment or other patient management decisions, including infection  control decisions, particularly in the presence of clinical signs and  symptoms consistent with COVID-19, or in those who have been in contact with the virus.  Negative results must be combined with clinical observations, patient history, and epidemiological information. The expected result is Negative.  Fact Sheet for Patients: HandmadeRecipes.com.cy  Fact Sheet for Healthcare Providers: FuneralLife.at  This test is not yet approved or cleared by the [place] FDA and  has been authorized for detection and/or diagnosis of SARS-CoV-2 by FDA under an Emergency Use Authorization (EUA).  This EUA will remain in effect (meaning this test can be used) for the duration of  the COV ID-19 declaration under Section 564(b)(1) of the Act, 21 U.S.C. section 360bbb-3(b)(1), unless the authorization is terminated or revoked sooner.    Brain natriuretic peptide     Status: Abnormal   Collection Time: 08/05/20  3:29 AM  Result Value Ref Range   B Natriuretic Peptide 113.0 (H) 0.0 - 100.0 pg/mL    Comment: Performed at [facility] [address], [place], [place] [address]  Urinalysis, Routine w reflex microscopic Urine, Clean Catch     Status: Abnormal   Collection Time: 08/05/20  4:20 AM  Result Value Ref Range   Color, Urine AMBER (A) YELLOW    Comment: BIOCHEMICALS MAY BE AFFECTED BY COLOR   APPearance TURBID (A) CLEAR   Specific Gravity, Urine 1.017 1.005 - 1.030   pH 5.0 5.0 - 8.0   Glucose, UA NEGATIVE NEGATIVE mg/dL   Hgb urine dipstick LARGE (A) NEGATIVE   Bilirubin Urine NEGATIVE NEGATIVE   Ketones, ur NEGATIVE NEGATIVE mg/dL   Protein, ur 100 (A) NEGATIVE mg/dL   Nitrite NEGATIVE NEGATIVE   Leukocytes,Ua NEGATIVE NEGATIVE   RBC / HPF >50 (H) 0 - 5 RBC/hpf   WBC, UA >50 (H) 0 - 5  WBC/hpf   Bacteria, UA RARE (A) NONE SEEN   Squamous Epithelial / LPF 0-5 0 - 5    Comment: Performed at [facility], [address], [place], [place] [address]  Troponin I (High Sensitivity)     Status: Abnormal   Collection Time: 08/05/20  4:22 AM  Result Value Ref Range   Troponin I (High Sensitivity) 1,248 (HH) <18 ng/L    Comment: CRITICAL RESULT CALLED TO, READ BACK BY AND VERIFIED WITH: Duanne Guess RN [phone] M GARRETT (NOTE) Elevated high sensitivity troponin I (hsTnI) values and significant  changes across serial measurements may suggest ACS but many other  chronic and acute conditions are known to elevate hsTnI results.  Refer to the Links section for chest pain algorithms and additional  guidance. Performed at [facility], [place] [address], [place], [place] [address]   Lactic acid, plasma     Status: None   Collection Time: 08/05/20  4:24 AM  Result Value Ref Range   Lactic Acid, Venous 1.0 0.5 - 1.9 mmol/L  Comment: Performed at [facility], [place] [address] [place], [place] [address]  Troponin I (High Sensitivity)     Status: Abnormal   Collection Time: 08/05/20  7:29 AM  Result Value Ref Range   Troponin I (High Sensitivity) 1,029 (HH) <18 ng/L    Comment: CRITICAL VALUE NOTED.  VALUE IS CONSISTENT WITH PREVIOUSLY REPORTED AND CALLED VALUE. (NOTE) Elevated high sensitivity troponin I (hsTnI) values and significant  changes across serial measurements may suggest ACS but many other  chronic and acute conditions are known to elevate hsTnI results.  Refer to the Links section for chest pain algorithms and additional  guidance. Performed at [facility], [place] [address], [place], [place] [address]   HIV Antibody (routine testing w rflx)     Status: None   Collection Time: 08/05/20  7:29 AM  Result Value Ref Range   HIV Screen 4th Generation wRfx Non Reactive Non Reactive    Comment: Performed at [facility], [place] [address] [place], [place] [address]  Magnesium     Status: None   Collection Time: 08/05/20  7:29 AM  Result Value Ref Range   Magnesium 1.8 1.7 - 2.4 mg/dL    Comment: Performed at [facility] [address] [place],  [address]  D-dimer, quantitative (not at [facility])     Status: Abnormal   Collection Time: 08/05/20  7:29 AM  Result Value Ref Range   D-Dimer, Quant 6.68 (H) 0.00 - 0.50 ug/mL-FEU    Comment: (NOTE) At the manufacturer cut-off value of 0.5 g/mL FEU, this assay has a negative predictive value of 95-100%.This assay is intended for use in conjunction with a clinical pretest probability (PTP) assessment model to exclude pulmonary embolism (PE) and deep venous thrombosis (DVT) in outpatients suspected of PE or DVT. Results should be correlated with clinical presentation. Performed at [facility], [place] [address], [place], [place] [address]   Procalcitonin - Baseline     Status: None   Collection Time: 08/05/20  7:29 AM  Result Value Ref Range   Procalcitonin 0.24 ng/mL    Comment:        Interpretation: PCT (Procalcitonin) <= 0.5 ng/mL: Systemic infection (sepsis) is not likely. Local bacterial infection is possible. (NOTE)       Sepsis PCT Algorithm           Lower Respiratory Tract                                      Infection PCT Algorithm    ----------------------------     ----------------------------         PCT < 0.25 ng/mL                PCT < 0.10 ng/mL          Strongly encourage             Strongly discourage   discontinuation of antibiotics    initiation of antibiotics    ----------------------------     -----------------------------       PCT 0.25 - 0.50 ng/mL            PCT 0.10 - 0.25 ng/mL               OR       >80% decrease in PCT  Discourage initiation of                                            antibiotics      Encourage discontinuation           of antibiotics    ----------------------------      -----------------------------         PCT >= 0.50 ng/mL              PCT 0.26 - 0.50 ng/mL               AND        <80% decrease in PCT             Encourage initiation of                                             antibiotics       Encourage continuation           of antibiotics    ----------------------------     -----------------------------        PCT >= 0.50 ng/mL                  PCT > 0.50 ng/mL               AND         increase in PCT                  Strongly encourage                                      initiation of antibiotics    Strongly encourage escalation           of antibiotics                                     -----------------------------                                           PCT <= 0.25 ng/mL                                                 OR                                        > 80% decrease in PCT                                      Discontinue / Do not initiate  antibiotics  Performed at [facility], [place] [address] [place], [place] [address]   Basic metabolic panel     Status: Abnormal   Collection Time: 08/05/20  7:29 AM  Result Value Ref Range   Sodium 136 135 - 145 mmol/L   Potassium 3.3 (L) 3.5 - 5.1 mmol/L   Chloride 102 98 - 111 mmol/L   CO2 23 22 - 32 mmol/L   Glucose, Bld 113 (H) 70 - 99 mg/dL    Comment: Glucose reference range applies only to samples taken after fasting for at least 8 hours.   BUN 11 6 - 20 mg/dL   Creatinine, Ser 1.18 (H) 0.44 - 1.00 mg/dL   Calcium 8.0 (L) 8.9 - 10.3 mg/dL   GFR, Estimated 55 (L) >60 mL/min    Comment: (NOTE) Calculated using the CKD-EPI Creatinine Equation (2021)    Anion gap 11 5 - 15    Comment: Performed at [facility] [address], [place], [place] [address]  TSH     Status: None   Collection Time: 08/05/20  7:32 AM  Result Value Ref Range   TSH 1.264 0.350 - 4.500 uIU/mL    Comment: Performed by a 3rd Generation assay  with a functional sensitivity of <=0.01 uIU/mL. Performed at [facility], [place] [address] [place], [place] [address]   C-reactive protein     Status: Abnormal   Collection Time: 08/05/20  7:32 AM  Result Value Ref Range   CRP 20.0 (H) <1.0 mg/dL    Comment: Performed at [facility] [address] [place], [place] [address]  CBC     Status: Abnormal   Collection Time: 08/05/20  7:32 AM  Result Value Ref Range   WBC 9.3 4.0 - 10.5 K/uL   RBC 4.11 3.87 - 5.11 MIL/uL   Hemoglobin 9.8 (L) 12.0 - 15.0 g/dL   HCT 32.1 (L) 36.0 - 46.0 %   MCV 78.1 (L) 80.0 - 100.0 fL   MCH 23.8 (L) 26.0 - 34.0 pg   MCHC 30.5 30.0 - 36.0 g/dL   RDW 17.5 (H) 11.5 - 15.5 %   Platelets 258 150 - 400 K/uL   nRBC 0.0 0.0 - 0.2 %    Comment: Performed at [facility], [place] [address], [place], [place] [address]  hCG, serum, qualitative     Status: None   Collection Time: 08/05/20  8:45 AM  Result Value Ref Range   Preg, Serum NEGATIVE NEGATIVE    Comment: NEGATIVE        THE SENSITIVITY OF THIS METHODOLOGY IS >10 mIU/mL. Performed at [facility], [place] [address] [place], [place] [address]    DG Chest Port 1 View  Result Date: 08/05/2020 CLINICAL DATA:  Sepsis, cough EXAM: PORTABLE CHEST 1 VIEW COMPARISON:  08/01/2020 FINDINGS: The heart size and mediastinal contours are within normal limits. Both lungs are clear. The visualized skeletal structures are unremarkable. IMPRESSION: No active disease. Electronically Signed   By: Fidela Salisbury MD   On: 08/05/2020 01:10   VAS [place] LOWER EXTREMITY VENOUS (DVT)  Result Date: 08/05/2020  Lower Venous DVT Study Indications: Edema, SOB.  Limitations: Body habitus and poor ultrasound/tissue interface. Comparison Study: No prior studies. Performing Technologist: Darlin Coco, RDMS  Examination Guidelines: A complete evaluation includes B-mode imaging, spectral Doppler, color Doppler, and power Doppler as needed of all accessible portions of each  vessel. Bilateral testing is considered an integral  part of a complete examination. Limited examinations for reoccurring indications may be performed as noted. The reflux portion of the exam is performed with the patient in reverse Trendelenburg.  +---------+---------------+---------+-----------+----------+--------------+ RIGHT    CompressibilityPhasicitySpontaneityPropertiesThrombus Aging +---------+---------------+---------+-----------+----------+--------------+ CFV      Full           Yes      Yes                                 +---------+---------------+---------+-----------+----------+--------------+ SFJ      Full                                                        +---------+---------------+---------+-----------+----------+--------------+ FV Prox  Full                                                        +---------+---------------+---------+-----------+----------+--------------+ FV Mid                  Yes      Yes                                 +---------+---------------+---------+-----------+----------+--------------+ FV DistalFull           Yes      Yes                                 +---------+---------------+---------+-----------+----------+--------------+ PFV      Full                                                        +---------+---------------+---------+-----------+----------+--------------+ POP      Full           Yes      Yes                                 +---------+---------------+---------+-----------+----------+--------------+ PTV      Full                                                        +---------+---------------+---------+-----------+----------+--------------+   +---------+---------------+---------+-----------+----------+--------------+ LEFT     CompressibilityPhasicitySpontaneityPropertiesThrombus Aging +---------+---------------+---------+-----------+----------+--------------+ CFV      Full            Yes      Yes                                 +---------+---------------+---------+-----------+----------+--------------+ SFJ      Full                                                        +---------+---------------+---------+-----------+----------+--------------+  FV Prox  Full                                                        +---------+---------------+---------+-----------+----------+--------------+ FV Mid                  Yes      Yes                                 +---------+---------------+---------+-----------+----------+--------------+ FV Distal               Yes      Yes                                 +---------+---------------+---------+-----------+----------+--------------+ PFV      Full                                                        +---------+---------------+---------+-----------+----------+--------------+ POP      Full           Yes      Yes                                 +---------+---------------+---------+-----------+----------+--------------+ PTV      Full           Yes      Yes                                 +---------+---------------+---------+-----------+----------+--------------+     Summary: RIGHT: - There is no evidence of deep vein thrombosis in the lower extremity. However, portions of this examination were limited- see technologist comments above.  - No cystic structure found in the popliteal fossa.  LEFT: - There is no evidence of deep vein thrombosis in the lower extremity. However, portions of this examination were limited- see technologist comments above.  - No cystic structure found in the popliteal fossa.  *See table(s) above for measurements and observations. Electronically signed by Deitra Mayo MD on 08/05/2020 at 11:57:07 AM.    Final     Review Of Systems Constitutional: Positive fever, chills, Chronic weight gain. Eyes: No vision change, wears glasses. No discharge or pain. Ears: No hearing loss,  No tinnitus. Respiratory: No asthma, COPD, pneumonias. Positive shortness of breath. No hemoptysis. Cardiovascular: Positive chest pain, palpitation, leg edema. Gastrointestinal: No nausea, vomiting, diarrhea, constipation. No GI bleed. No hepatitis. Genitourinary: No dysuria, hematuria, kidney stone. No incontinance. Neurological: Positive headache, no stroke, seizures.  Psychiatry: No psych facility admission for anxiety, depression, suicide. No detox. Skin: No rash. Musculoskeletal: Positive joint pain, fibromyalgia, neck pain, back pain. Lymphadenopathy: No lymphadenopathy. Hematology: Positive anemia, no easy bruising.   Blood pressure 130/73, pulse (!) 119, temperature 99.6 F (37.6 C), temperature source Oral, resp. rate (!) 24, height '5\' 4"'  (1.626 m), weight (!) 152.8 kg, last menstrual period 12/15/2019, SpO2 100 %. Body mass index is 57.82 kg/m.  General appearance: alert, cooperative, appears stated age and moderate respiratory distress Head: Normocephalic, atraumatic. Eyes: Brown eyes, pink conjunctiva, corneas clear.  Neck: No adenopathy, no carotid bruit, no JVD, supple, symmetrical, trachea midline and thyroid not enlarged. Resp: Rhonchi to auscultation bilaterally. Cardio: Irregular rate and rhythm, S1, S2 normal, II/VI systolic murmur, no click, rub or gallop GI: Soft, non-tender; bowel sounds normal; no organomegaly. Extremities: 2 + edema, no cyanosis or clubbing. Skin: Warm and dry.  Neurologic: Alert and oriented X 3, normal strength. Normal coordination and slow gait.  Assessment/Plan Acute high lateral wall MI Atrial fibrillation with RVR Acute respiratory failure with hypoxia R/O PE Acute diastolic left heart failure COVID - 19 infection Morbid obesity Asthma Iron deficiency anemia  Agree with IV heparin and IV diltiazem. Add Metoprolol and digoxin for heart rate control. Will use amiodarone if above do not work. Check CT angio chest result when  available. Check LV function when echocardiogram is done.  Time spent: Review of old records, Lab, x-rays, EKG, other cardiac tests, examination, discussion with patient/Nurse/Physician over 70 minutes.  Birdie Riddle, MD  08/05/2020, 1:09 PM

## 2020-08-05 NOTE — H&P (Addendum)
History and Physical    Sheri Park WGN:[id number] DOB: 02/26/1966 DOA: 08/05/2020  PCP: Orpah CobbKadakia, Ajay, MD Patient coming from: Home  Chief Complaint: Shortness of breath  HPI: Sheri Park is a 55 y.o. female with medical history significant of asthma, hypertension presenting to the ED via EMS for evaluation of shortness of breath, fatigue, cough, and poor oral intake for the past 5 days.  Febrile with EMS with temperature 104 F, was given 1 g Tylenol prior to arrival.  Episodes of SVT noted by EMS in route.  She was given 500 cc normal saline bolus and aspirin 324 mg. Patient states she tested positive for COVID-19 on 07/23/2020. She is not vaccinated against COVID or influenza.  She is a feeling ill for the past 1 week - having high fevers, chills, cough, and shortness of breath. About a week ago she took Mucinex and soon after started having a pruritic rash on her arms and legs. For the past 2 days she is having constant left-sided chest tightness. States recently she went to urgent care and they did another COVID test and she was told it was negative. Denies history of A. fib.  ED Course: In A. fib with rate up to 150s.  Not hypotensive.  Not hypoxic, she was placed on supplemental oxygen for comfort.  SARS-CoV-2 rapid antigen test negative.  WBC 11.1, hemoglobin 10.2 (no significant change from baseline), platelet count 275K.  Sodium 134, potassium 3.0, chloride 101, bicarb 22, BUN 11, creatinine 1.4 (baseline 0.8), glucose 156.  LFTs normal.  Blood culture x2 pending.  INR 1.1.  Beta hCG 18.3.  Lactic acid normal x2.  BNP 113.  UA with negative nitrite, negative leukocytes, greater than 50 RBCs, greater than 50 WBCs, and rare bacteria.  Urine culture pending.  Chest x-ray showing no active disease. Patient was given Cardizem load and started on infusion.  She was also given ibuprofen, vancomycin, cefepime, Flagyl, and 2 L LR boluses.  Review of Systems:  All systems reviewed and  apart from history of presenting illness, are negative.  Past Medical History:  Diagnosis Date  . Asthma   . Hypertension     Past Surgical History:  Procedure Laterality Date  . KNEE SURGERY    . LEFT HEART CATH AND CORONARY ANGIOGRAPHY N/A 10/20/2016   Procedure: Left Heart Cath and Coronary Angiography;  Surgeon: Orpah CobbAjay Kadakia, MD;  Location: [facility];  Service: Cardiovascular;  Laterality: N/A;  . TUBAL LIGATION       reports that she has never smoked. She has never used smokeless tobacco. She reports that she does not drink alcohol and does not use drugs.  Allergies  Allergen Reactions  . Peanut-Containing Drug Products Anaphylaxis    Oil, nuts, anything peanut related  . Shellfish Allergy Anaphylaxis  . Codeine Itching  . Ketorolac Tromethamine Swelling  . Toradol [Ketorolac Tromethamine] Swelling  . Mucinex [Guaifenesin Er] Rash    Family History  Problem Relation Age of Onset  . Diabetes Mother   . Cancer Mother   . Breast cancer Mother [id number]  . Breast cancer Maternal Aunt     Prior to Admission medications   Medication Sig Start Date End Date Taking? Authorizing Provider  acetaminophen (TYLENOL) 500 MG tablet Take 2 tablets (1,000 mg total) by mouth every 8 (eight) hours as needed. Patient taking differently: Take 1,000 mg by mouth every 8 (eight) hours as needed for moderate pain or headache. 08/01/20  Yes Georgetta HaberBurky, Natalie B,  NP  albuterol (VENTOLIN HFA) 108 (90 Base) MCG/ACT inhaler Inhale 1-2 puffs into the lungs every 6 (six) hours as needed for wheezing or shortness of breath. 08/01/20  Yes Burky, Dorene Grebe B, NP  amLODipine (NORVASC) 5 MG tablet Take 1 tablet (5 mg total) by mouth daily. 04/07/16  Yes [name], MD  [facility] D3 25 MCG (1000 UT) capsule Take 1,000 Units by mouth daily. 01/12/19  Yes [provider]  cyclobenzaprine (FLEXERIL) 5 MG tablet Take 1 tablet (5 mg total) by mouth at bedtime. 08/01/20  Yes Burky, Dorene Grebe B, NP   hydrochlorothiazide (HYDRODIURIL) 12.5 MG tablet Take 12.5 mg by mouth daily.   Yes [provider]  OVER THE COUNTER MEDICATION Take 1 tablet by mouth daily. [place]- vitamin   Yes [provider]  triamcinolone (KENALOG) 0.1 % Apply 1 application topically 2 (two) times daily. 08/01/20  Yes Burky, Barron Alvine, NP  vitamin B-12 (CYANOCOBALAMIN) 1000 MCG tablet Take 1,000 mcg by mouth daily.   Yes [provider]  vitamin C (ASCORBIC ACID) 250 MG tablet Take 250 mg by mouth daily.   Yes [provider]  Zinc Sulfate (ZINC-220 PO) Take 220 mg by mouth daily.   Yes [provider]  ferrous sulfate 325 (65 FE) MG tablet Take 1 tablet (325 mg total) by mouth 2 (two) times daily with a meal. Patient not taking: Reported on 05/06/2019 10/21/16 12/22/19  Orpah Cobb, MD  fluticasone (FLOVENT HFA) 110 MCG/ACT inhaler Inhale 1 puff into the lungs 2 (two) times daily. Patient not taking: Reported on 05/06/2019 12/06/14 12/22/19  Linwood Dibbles, MD  potassium chloride (K-DUR) 10 MEQ tablet Take 10 mEq by mouth daily.  12/22/19  [provider]  promethazine (PHENERGAN) 25 MG tablet Take 1 tablet (25 mg total) by mouth every 6 (six) hours as needed for nausea or vomiting. Patient not taking: Reported on 08/20/2018 06/16/18 12/22/19  Garey Ham, PA-C    Physical Exam: Vitals:   08/05/20 0534 08/05/20 0545 08/05/20 0630 08/05/20 0700  BP: 100/79 (!) 90/56 91/66   Pulse: (!) 125 (!) 120 (!) 121   Resp: (!) 25 (!) 26 (!) 21   Temp:      TempSrc:      SpO2: 100% 99% 100%   Weight:    (!) 152.8 kg  Height:    5\' 4"  (1.626 m)    Physical Exam Constitutional:      General: She is not in acute distress. HENT:     Head: Normocephalic and atraumatic.  Eyes:     Extraocular Movements: Extraocular movements intact.     Conjunctiva/sclera: Conjunctivae normal.  Cardiovascular:     Rate and Rhythm: Normal rate and regular rhythm.     Pulses: Normal  pulses.  Pulmonary:     Effort: Pulmonary effort is normal. No respiratory distress.     Breath sounds: No wheezing or rales.     Comments: Satting 99-100% on room air Abdominal:     General: Bowel sounds are normal. There is no distension.     Palpations: Abdomen is soft.     Tenderness: There is no abdominal tenderness.  Musculoskeletal:        General: No swelling or tenderness.     Cervical back: Normal range of motion and neck supple. No rigidity.  Skin:    General: Skin is warm and dry.     Comments: Raised erythematous plaques noted on the trunk and bilateral upper and lower extremities  Neurological:     General: No focal deficit present.     Mental Status: She is alert and oriented to person, place, and time.     Labs on Admission: I have personally reviewed following labs and imaging studies  CBC: Recent Labs  Lab 08/01/20 0818 08/05/20 0115  WBC 6.2 11.1*  NEUTROABS  --  7.9*  HGB 11.6* 10.2*  HCT 36.4 31.2*  MCV 75.8* 75.7*  PLT 291 275   Basic Metabolic Panel: Recent Labs  Lab 08/01/20 0818 08/05/20 0115  NA 140 134*  K 3.8 3.0*  CL 104 101  CO2 25 22  GLUCOSE 103* 156*  BUN 7 11  CREATININE 0.86 1.40*  CALCIUM 9.0 8.1*   GFR: Estimated Creatinine Clearance: 68.9 mL/min (A) (by C-G formula based on SCr of 1.4 mg/dL (H)). Liver Function Tests: Recent Labs  Lab 08/05/20 0115  AST 27  ALT 18  ALKPHOS 47  BILITOT 1.1  PROT 6.8  ALBUMIN 2.8*   No results for input(s): LIPASE, AMYLASE in the last 168 hours. No results for input(s): AMMONIA in the last 168 hours. Coagulation Profile: Recent Labs  Lab 08/05/20 0115  INR 1.1   Cardiac Enzymes: No results for input(s): CKTOTAL, CKMB, CKMBINDEX, TROPONINI in the last 168 hours. BNP (last 3 results) No results for input(s): PROBNP in the last 8760 hours. HbA1C: No results for input(s): HGBA1C in the last 72 hours. CBG: No results for input(s): GLUCAP in the last 168 hours. Lipid  Profile: No results for input(s): CHOL, HDL, LDLCALC, TRIG, CHOLHDL, LDLDIRECT in the last 72 hours. Thyroid Function Tests: No results for input(s): TSH, T4TOTAL, FREET4, T3FREE, THYROIDAB in the last 72 hours. Anemia Panel: No results for input(s): VITAMINB12, FOLATE, FERRITIN, TIBC, IRON, RETICCTPCT in the last 72 hours. Urine analysis:    Component Value Date/Time   COLORURINE AMBER (A) 08/05/2020 0420   APPEARANCEUR TURBID (A) 08/05/2020 0420   LABSPEC 1.017 08/05/2020 0420   PHURINE 5.0 08/05/2020 0420   GLUCOSEU NEGATIVE 08/05/2020 0420   HGBUR LARGE (A) 08/05/2020 0420   BILIRUBINUR NEGATIVE 08/05/2020 0420   KETONESUR NEGATIVE 08/05/2020 0420   PROTEINUR 100 (A) 08/05/2020 0420   UROBILINOGEN 0.2 12/22/2019 1136   NITRITE NEGATIVE 08/05/2020 0420   LEUKOCYTESUR NEGATIVE 08/05/2020 0420    Radiological Exams on Admission: DG Chest Port 1 View  Result Date: 08/05/2020 CLINICAL DATA:  Sepsis, cough EXAM: PORTABLE CHEST 1 VIEW COMPARISON:  08/01/2020 FINDINGS: The heart size and mediastinal contours are within normal limits. Both lungs are clear. The visualized skeletal structures are unremarkable. IMPRESSION: No active disease. Electronically Signed   By: Helyn Numbers MD   On: 08/05/2020 01:10    EKG: Independently reviewed.  A. fib, T wave abnormality in inferior and lateral leads.  Assessment/Plan Principal Problem:   Sepsis (HCC) Active Problems:   Atrial fibrillation with rapid ventricular response (HCC)   Chest pain   Urticaria   AKI (acute kidney injury) (HCC)   Sepsis, unclear source: Febrile with temperature 104 F by EMS. Tachycardic. Blood pressure soft, however, lactic acid x2 normal.  No significant leukocytosis on labs.  Patient reports testing positive for COVID on 1/10 at an outside facility. Subsequently had a negative SARS-CoV-2 PCR test at [facility] urgent care on 08/01/2020. SARS-CoV-2 rapid antigen test done in the ED today negative. Chest x-ray not  suggestive of pneumonia. UA not strongly suggestive of UTI (negative nitrite, negative leukocytes, greater than 50 WBCs, and rare bacteria). Patient is  not endorsing UTI symptoms. No confusion or meningeal signs. -Continue broad-spectrum antibiotics including vancomycin, Zosyn, and Flagyl at this time. Urine and blood cultures pending. Check procalcitonin level. Influenza panel ordered.  New onset A. fib with RVR: In A. fib with rate up to 150s initially, now improved to 110s after Cardizem bolus and infusion. Given fever, suspect precipitating factor is underlying infection. PE is a consideration but less likely as patient is not hypoxic. She is satting 99-100% on room air at present and no signs of respiratory distress. -Cardiac monitoring. Continue Cardizem infusion. CHA2DSVASc 2.  Started on heparin for anticoagulation.  Echocardiogram ordered. Check TSH level. Check D-dimer, if elevated, order CT angiogram to rule out PE.  Please consult cardiology in the morning.  Will defer decision regarding long-term anticoagulation to cardiology.  Chest pain: Patient endorsing constant left-sided chest tightness for the past 2 days. Appears comfortable on exam.  Chest discomfort could possibly be related to A. fib, however, EKG showing T wave abnormality in inferior and lateral leads, ?rate related. -Cardiac monitoring.  Stat high-sensitivity troponin ordered. Addendum: High-sensitivity troponin came back significantly elevated at 1248.  Discussed with Dr. Wyline Mood, cardiology will consult.  Recommended continuing heparin.  Urticaria: Patient complaining of a pruritic rash for the past 1 week after she took Mucinex for cough. She has raised erythematous plaques on her trunk and bilateral upper and lower extremities. No signs of angioedema. -Claritin 10 mg daily  Hypokalemia: Likely due to poor oral intake in the setting of acute illness. -Replete potassium. Check magnesium level and replete if low. Continue to  monitor electrolytes.  AKI: Creatinine 1.4, baseline 0.8. Likely prerenal due to dehydration in setting of acute illness. -IV fluid hydration, continue to monitor renal function  Asthma: Stable. No wheezing. -Albuterol nebulizer as needed  Hypertension -Hold antihypertensives given sepsis and soft blood pressure readings  Elevated beta-hCG: Beta-hCG 18.3. Patient states she has not had menstrual cycles for a year. -Repeat lab to confirm  DVT prophylaxis: Lovenox Code Status: Full code Family Communication: No family available at this time. Disposition Plan: Status is: Inpatient  Remains inpatient appropriate because:IV treatments appropriate due to intensity of illness or inability to take PO and Inpatient level of care appropriate due to severity of illness   Dispo: The patient is from: Home              Anticipated d/c is to: Home              Anticipated d/c date is: 3 days              Patient currently is not medically stable to d/c.   Difficult to place patient No  The medical decision making on this patient was of high complexity and the patient is at high risk for clinical deterioration, therefore this is a level 3 visit.  John Giovanni MD [facility]  If 7PM-7AM, please contact night-coverage [email or web address]  08/05/2020, 7:08 AM

## 2020-08-05 NOTE — ED Triage Notes (Signed)
Pt BIB [facility] from home, c/o increased shortness of breath, fatigue, cough and poor oral intake x 5 days. +covid on 1/10, - on 1/19. Febrile with EMS at 104, given 1g tylenol pta. EMS noted pt tachycardic, "in and out of SVT", given 500ccNS and 324mg  asa.

## 2020-08-05 NOTE — Progress Notes (Signed)
Lower extremity venous bilateral study completed.   Please see CV Proc for preliminary results.   Shiraz Bastyr, RDMS  

## 2020-08-05 NOTE — Progress Notes (Addendum)
PROGRESS NOTE    Sheri Park  YQI:[id number] DOB: [birth year] DOA: 08/05/2020 PCP: Orpah Cobb, MD  Brief Narrative: This is a 55 year old female with history of asthma, hypertension, morbid obesity, iron deficiency anemia presented to the ED early this morning with cough congestion fatigue shortness of breath chest pain fever and chills.  She reports having a COVID exposure over 2 weeks ago, [place], she is unvaccinated since 1/14 started developing cough congestion fatigue body aches etc., started taking Mucinex and then developed pruritic rash, last couple of days has had left-sided chest tightness, went to urgent care recently and had another COVID PCR which was negative -Came to the emergency room today noted to be tachycardic with A. fib RVR, heart rate in the 150s, white count of 11.1, hemoglobin of 10.2 creatinine of 1.4 abnormal urinalysis, chest x-ray was unremarkable -Started on a Cardizem drip, along with fluid boluses  Assessment & Plan:   Atrial fibrillation with RVR -Continue Cardizem drip, heparin drip -add PO metoprolol  -Follow-up echocardiogram -CTA chest to rule out PE  NSTEMi vs Demand ischemia -Could be secondary to A. fib RVR, history of left heart cath in 2018 which noted normal coronaries -EKG with ST depression in inferior leads and ST elevation in V2 -Will obtain echocardiogram, cardiology Dr. Algie Coffer consulted -continue IV Heparin, add metoprolol  SARS COVID 19 infection -Tested positive on 1/10 -She is unvaccinated, no evidence of pneumonia or hypoxemia at this time -However PE is high possibility, will obtain CTA chest to rule out pulmonary embolism, repeat BMP notes improvement in creatinine to 1.1 -Continue IV heparin -Clinically I do not suspect bacterial sepsis at this time, will stop vancomycin and cefepime and monitor, procalcitonin is low, CRP is profoundly elevated c/w Covid -FU CT  chest -DC IV fluids, has received 3 L of IVF in the ED so far -Add isolation  Urticaria -Drug rash following Mucinex use, continue Claritin  Mild AKI -Resolved with hydration, monitor  Morbid obesity  -BMI is 57.8, needs lifestyle modification  Abnormal i-STAT hCG was a lab error, serum hCG is negative  DVT prophylaxis: Heparin drip Code Status: Full code Family Communication: No family at bedside Disposition Plan:  Status is: Inpatient  Remains inpatient appropriate because:Inpatient level of care appropriate due to severity of illness   Dispo: The patient is from: Home              Anticipated d/c is to: Home              Anticipated d/c date is: > 3 days              Patient currently is not medically stable to d/c.   Difficult to place patient No  Consultants:   Cardiology   Procedures:   Antimicrobials:    Subjective: -Complains of cough congestion shortness of breath and some chest pain  Objective: Vitals:   08/05/20 1000 08/05/20 1015 08/05/20 1030 08/05/20 1115  BP: 128/70 130/72 (!) 143/62 (!) 153/89  Pulse: (!) 124  (!) 125 (!) 128  Resp: 17   (!) 30  Temp:      TempSrc:      SpO2: 100%  100% 100%  Weight:      Height:       No intake or output data in the 24 hours ending 08/05/20 1200 Filed Weights   08/05/20 0700  Weight: (!) 152.8 kg    Examination:  General  exam: Obese pleasant female uncomfortable appearing, AAOx3 CVS: S1-S2, irregularly irregular rhythm Lungs: Distant breath sounds otherwise clear Abdomen: Soft, nontender, bowel sounds present Extremities: No edema Neuro: Moves all extremities, no localizing signs Skin: Rash on her arms especially right upper extremity Psych: Appropriate mood and affect  Data Reviewed:   CBC: Recent Labs  Lab 08/01/20 0818 08/05/20 0115  WBC 6.2 11.1*  NEUTROABS  --  7.9*  HGB 11.6* 10.2*  HCT 36.4 31.2*  MCV 75.8* 75.7*  PLT 291 275   Basic Metabolic Panel: Recent Labs  Lab  08/01/20 0818 08/05/20 0115 08/05/20 0729  NA 140 134* 136  K 3.8 3.0* 3.3*  CL 104 101 102  CO2 25 22 23   GLUCOSE 103* 156* 113*  BUN 7 11 11   CREATININE 0.86 1.40* 1.18*  CALCIUM 9.0 8.1* 8.0*  MG  --   --  1.8   GFR: Estimated Creatinine Clearance: 81.7 mL/min (A) (by C-G formula based on SCr of 1.18 mg/dL (H)). Liver Function Tests: Recent Labs  Lab 08/05/20 0115  AST 27  ALT 18  ALKPHOS 47  BILITOT 1.1  PROT 6.8  ALBUMIN 2.8*   No results for input(s): LIPASE, AMYLASE in the last 168 hours. No results for input(s): AMMONIA in the last 168 hours. Coagulation Profile: Recent Labs  Lab 08/05/20 0115  INR 1.1   Cardiac Enzymes: No results for input(s): CKTOTAL, CKMB, CKMBINDEX, TROPONINI in the last 168 hours. BNP (last 3 results) No results for input(s): PROBNP in the last 8760 hours. HbA1C: No results for input(s): HGBA1C in the last 72 hours. CBG: No results for input(s): GLUCAP in the last 168 hours. Lipid Profile: No results for input(s): CHOL, HDL, LDLCALC, TRIG, CHOLHDL, LDLDIRECT in the last 72 hours. Thyroid Function Tests: Recent Labs    08/05/20 0732  TSH 1.264   Anemia Panel: No results for input(s): VITAMINB12, FOLATE, FERRITIN, TIBC, IRON, RETICCTPCT in the last 72 hours. Urine analysis:    Component Value Date/Time   COLORURINE AMBER (A) 08/05/2020 0420   APPEARANCEUR TURBID (A) 08/05/2020 0420   LABSPEC 1.017 08/05/2020 0420   PHURINE 5.0 08/05/2020 0420   GLUCOSEU NEGATIVE 08/05/2020 0420   HGBUR LARGE (A) 08/05/2020 0420   BILIRUBINUR NEGATIVE 08/05/2020 0420   KETONESUR NEGATIVE 08/05/2020 0420   PROTEINUR 100 (A) 08/05/2020 0420   UROBILINOGEN 0.2 12/22/2019 1136   NITRITE NEGATIVE 08/05/2020 0420   LEUKOCYTESUR NEGATIVE 08/05/2020 0420   Sepsis Labs: @LABRCNTIP (procalcitonin:4,lacticidven:4)  ) Recent Results (from the past 240 hour(s))  SARS CORONAVIRUS 2 (TAT 6-24 HRS) Nasopharyngeal Nasopharyngeal Swab     Status: None    Collection Time: 08/01/20  2:56 PM   Specimen: Nasopharyngeal Swab  Result Value Ref Range Status   SARS Coronavirus 2 NEGATIVE NEGATIVE Final    Comment: (NOTE) SARS-CoV-2 target nucleic acids are NOT DETECTED.  The SARS-CoV-2 RNA is generally detectable in upper and lower respiratory specimens during the acute phase of infection. Negative results do not preclude SARS-CoV-2 infection, do not rule out co-infections with other pathogens, and should not be used as the sole basis for treatment or other patient management decisions. Negative results must be combined with clinical observations, patient history, and epidemiological information. The expected result is Negative.  Fact Sheet for Patients: HairSlick.nohttps://www.fda.gov/media/138098/download  Fact Sheet for Healthcare Providers: quierodirigir.comhttps://www.fda.gov/media/138095/download  This test is not yet approved or cleared by the [place] FDA and  has been authorized for detection and/or diagnosis of SARS-CoV-2 by FDA under an Emergency  Use Authorization (EUA). This EUA will remain  in effect (meaning this test can be used) for the duration of the COVID-19 declaration under Se ction 564(b)(1) of the Act, 21 U.S.C. section 360bbb-3(b)(1), unless the authorization is terminated or revoked sooner.  Performed at [facility], [address] [place], [place] [address]   Blood Culture (routine x 2)     Status: None (Preliminary result)   Collection Time: 08/05/20  1:15 AM   Specimen: BLOOD  Result Value Ref Range Status   Specimen Description BLOOD RIGHT ANTECUBITAL  Final   Special Requests   Final    BOTTLES DRAWN AEROBIC AND ANAEROBIC Blood Culture adequate volume   Culture   Final    NO GROWTH < 12 HOURS Performed at [facility], [address] [place], [place] [address]    Report Status PENDING  Incomplete         Radiology Studies: DG Chest Port 1 View  Result Date: 08/05/2020 CLINICAL DATA:  Sepsis, cough  EXAM: PORTABLE CHEST 1 VIEW COMPARISON:  08/01/2020 FINDINGS: The heart size and mediastinal contours are within normal limits. Both lungs are clear. The visualized skeletal structures are unremarkable. IMPRESSION: No active disease. Electronically Signed   By: Helyn Numbers MD   On: 08/05/2020 01:10   VAS [place] LOWER EXTREMITY VENOUS (DVT)  Result Date: 08/05/2020  Lower Venous DVT Study Indications: Edema, SOB.  Limitations: Body habitus and poor ultrasound/tissue interface. Comparison Study: No prior studies. Performing Technologist: Jean Rosenthal, RDMS  Examination Guidelines: A complete evaluation includes B-mode imaging, spectral Doppler, color Doppler, and power Doppler as needed of all accessible portions of each vessel. Bilateral testing is considered an integral part of a complete examination. Limited examinations for reoccurring indications may be performed as noted. The reflux portion of the exam is performed with the patient in reverse Trendelenburg.  +---------+---------------+---------+-----------+----------+--------------+ RIGHT    CompressibilityPhasicitySpontaneityPropertiesThrombus Aging +---------+---------------+---------+-----------+----------+--------------+ CFV      Full           Yes      Yes                                 +---------+---------------+---------+-----------+----------+--------------+ SFJ      Full                                                        +---------+---------------+---------+-----------+----------+--------------+ FV Prox  Full                                                        +---------+---------------+---------+-----------+----------+--------------+ FV Mid                  Yes      Yes                                 +---------+---------------+---------+-----------+----------+--------------+ FV DistalFull           Yes      Yes                                  +---------+---------------+---------+-----------+----------+--------------+  PFV      Full                                                        +---------+---------------+---------+-----------+----------+--------------+ POP      Full           Yes      Yes                                 +---------+---------------+---------+-----------+----------+--------------+ PTV      Full                                                        +---------+---------------+---------+-----------+----------+--------------+   +---------+---------------+---------+-----------+----------+--------------+ LEFT     CompressibilityPhasicitySpontaneityPropertiesThrombus Aging +---------+---------------+---------+-----------+----------+--------------+ CFV      Full           Yes      Yes                                 +---------+---------------+---------+-----------+----------+--------------+ SFJ      Full                                                        +---------+---------------+---------+-----------+----------+--------------+ FV Prox  Full                                                        +---------+---------------+---------+-----------+----------+--------------+ FV Mid                  Yes      Yes                                 +---------+---------------+---------+-----------+----------+--------------+ FV Distal               Yes      Yes                                 +---------+---------------+---------+-----------+----------+--------------+ PFV      Full                                                        +---------+---------------+---------+-----------+----------+--------------+ POP      Full           Yes      Yes                                 +---------+---------------+---------+-----------+----------+--------------+  PTV      Full           Yes      Yes                                  +---------+---------------+---------+-----------+----------+--------------+     Summary: RIGHT: - There is no evidence of deep vein thrombosis in the lower extremity. However, portions of this examination were limited- see technologist comments above.  - No cystic structure found in the popliteal fossa.  LEFT: - There is no evidence of deep vein thrombosis in the lower extremity. However, portions of this examination were limited- see technologist comments above.  - No cystic structure found in the popliteal fossa.  *See table(s) above for measurements and observations. Electronically signed by Waverly Ferrarihristopher Dickson MD on 08/05/2020 at 11:57:07 AM.    Final         Scheduled Meds: . benzonatate  200 mg Oral TID  . loratadine  10 mg Oral Daily   Continuous Infusions: . diltiazem (CARDIZEM) infusion 12.5 mg/hr (08/05/20 1117)  . heparin 1,400 Units/hr (08/05/20 0746)  . lactated ringers       LOS: 0 days    Time spent: 35min    Zannie CovePreetha Ciani Rutten, MD [facility]  08/05/2020, 12:00 PM

## 2020-08-05 NOTE — ED Notes (Signed)
MD paged for patient Troponin level

## 2020-08-05 NOTE — Progress Notes (Signed)
ANTICOAGULATION CONSULT NOTE - Follow Up Consult  Pharmacy Consult for heparin Indication: Afib and acute MI  Labs: Recent Labs    08/05/20 0115 08/05/20 0422 08/05/20 0729 08/05/20 0732 08/05/20 2205  HGB 10.2*  --   --  9.8*  --   HCT 31.2*  --   --  32.1*  --   PLT 275  --   --  258  --   APTT 39*  --   --   --   --   LABPROT 13.7  --   --   --   --   INR 1.1  --   --   --   --   HEPARINUNFRC  --   --   --   --  <0.10*  CREATININE 1.40*  --  1.18*  --   --   TROPONINIHS  --  1,248* 1,029*  --   --     Assessment: 54yo female subtherapeutic on heparin with initial dosing for Afib and MI; no gtt issues or signs of bleeding per RN.  Goal of Therapy:  Heparin level 0.3-0.7 units/ml   Plan:  Will give 4000 units heparin bolus and increase heparin gtt by 4 units/kgABW/hr to 1800 units/hr and check level with am labs.    Vernard Gambles, PharmD, BCPS  08/05/2020,11:14 PM

## 2020-08-05 NOTE — ED Provider Notes (Signed)
[facility] EMERGENCY DEPARTMENT Provider Note   CSN: [id number] Arrival date & time: 08/05/20  0044     History Chief Complaint  Patient presents with  . Shortness of Breath    Sheri Park is a 55 y.o. female.  Patient is somewhat a poor historian but ultimately she is here for shortness of breath fatigue and cough.  Patient states that she been sick for the last 4 to 5 days.  She was recently tested on the 19th and negative for COVID.  She states that she was tested on the 10th as well and was positive at that time.  Unclear how this has changed.  Patient called EMS today and apparently she was febrile to 104 and she was 105 at home.  They gave her 1 g of Tylenol.  They states that she was intermittently in SVT with them.  Was given fluids and aspirin.  The patient is also quite tachypneic but not hypoxic with them.  At this time patient just feels unwell. Has had productive cough of yellow sputum. No sick contacts that she knows of.    Shortness of Breath      Past Medical History:  Diagnosis Date  . Asthma   . Hypertension     Patient Active Problem List   Diagnosis Date Noted  . Acute coronary syndrome (HCC) 10/18/2016  . Chest pain on exertion 10/17/2016    Past Surgical History:  Procedure Laterality Date  . KNEE SURGERY    . LEFT HEART CATH AND CORONARY ANGIOGRAPHY N/A 10/20/2016   Procedure: Left Heart Cath and Coronary Angiography;  Surgeon: Orpah Cobb, MD;  Location: [facility] INVASIVE CV LAB;  Service: Cardiovascular;  Laterality: N/A;  . TUBAL LIGATION       OB History   No obstetric history on file.     Family History  Problem Relation Age of Onset  . Diabetes Mother   . Cancer Mother   . Breast cancer Mother 68  . Breast cancer Maternal Aunt     Social History   Tobacco Use  . Smoking status: Never Smoker  . Smokeless tobacco: Never Used  Vaping Use  . Vaping Use: Never used  Substance Use Topics  . Alcohol use: No  .  Drug use: No    Home Medications Prior to Admission medications   Medication Sig Start Date End Date Taking? Authorizing Provider  acetaminophen (TYLENOL) 500 MG tablet Take 2 tablets (1,000 mg total) by mouth every 8 (eight) hours as needed. Patient taking differently: Take 1,000 mg by mouth every 8 (eight) hours as needed for moderate pain or headache. 08/01/20  Yes Burky, Barron Alvine, NP  albuterol (VENTOLIN HFA) 108 (90 Base) MCG/ACT inhaler Inhale 1-2 puffs into the lungs every 6 (six) hours as needed for wheezing or shortness of breath. 08/01/20  Yes Burky, Dorene Grebe B, NP  amLODipine (NORVASC) 5 MG tablet Take 1 tablet (5 mg total) by mouth daily. 04/07/16  Yes [name], MD  [employer] D3 25 MCG (1000 UT) capsule Take 1,000 Units by mouth daily. 01/12/19  Yes [provider]  cyclobenzaprine (FLEXERIL) 5 MG tablet Take 1 tablet (5 mg total) by mouth at bedtime. 08/01/20  Yes Burky, Dorene Grebe B, NP  hydrochlorothiazide (HYDRODIURIL) 12.5 MG tablet Take 12.5 mg by mouth daily.   Yes [provider]  OVER THE COUNTER MEDICATION Take 1 tablet by mouth daily. [place]- vitamin   Yes [provider]  triamcinolone (KENALOG) 0.1 %  Apply 1 application topically 2 (two) times daily. 08/01/20  Yes Burky, Barron Alvine, NP  vitamin B-12 (CYANOCOBALAMIN) 1000 MCG tablet Take 1,000 mcg by mouth daily.   Yes [provider]  vitamin C (ASCORBIC ACID) 250 MG tablet Take 250 mg by mouth daily.   Yes [provider]  Zinc Sulfate (ZINC-220 PO) Take 220 mg by mouth daily.   Yes [provider]  ferrous sulfate 325 (65 FE) MG tablet Take 1 tablet (325 mg total) by mouth 2 (two) times daily with a meal. Patient not taking: Reported on 05/06/2019 10/21/16 12/22/19  Orpah Cobb, MD  fluticasone (FLOVENT HFA) 110 MCG/ACT inhaler Inhale 1 puff into the lungs 2 (two) times daily. Patient not taking: Reported on 05/06/2019 12/06/14 12/22/19  Linwood Dibbles, MD  potassium chloride  (K-DUR) 10 MEQ tablet Take 10 mEq by mouth daily.  12/22/19  [provider]  promethazine (PHENERGAN) 25 MG tablet Take 1 tablet (25 mg total) by mouth every 6 (six) hours as needed for nausea or vomiting. Patient not taking: Reported on 08/20/2018 06/16/18 12/22/19  Rodriguez-Southworth, Nettie Elm, PA-C    Allergies    Peanut-containing drug products, Shellfish allergy, Codeine, Ketorolac tromethamine, Toradol [ketorolac tromethamine], and Mucinex [guaifenesin er]  Review of Systems   Review of Systems  Respiratory: Positive for shortness of breath.   All other systems reviewed and are negative.   Physical Exam Updated Vital Signs BP (!) 117/99   Pulse (!) 123   Temp 99.6 F (37.6 C) (Oral)   Resp (!) 23   LMP 12/15/2019   SpO2 100%   Physical Exam Vitals and nursing note reviewed.  Constitutional:      Appearance: She is well-developed and well-nourished.  HENT:     Head: Normocephalic and atraumatic.  Cardiovascular:     Rate and Rhythm: Regular rhythm. Tachycardia present.  Pulmonary:     Effort: Tachypnea present. No respiratory distress.     Breath sounds: No stridor. Decreased breath sounds (right) present. No wheezing or rhonchi.  Abdominal:     General: There is no distension.  Musculoskeletal:        General: Normal range of motion.     Cervical back: Normal range of motion.  Skin:    General: Skin is warm.  Neurological:     General: No focal deficit present.     Mental Status: She is alert.     ED Results / Procedures / Treatments   Labs (all labs ordered are listed, but only abnormal results are displayed) Labs Reviewed  COMPREHENSIVE METABOLIC PANEL - Abnormal; Notable for the following components:      Result Value   Sodium 134 (*)    Potassium 3.0 (*)    Glucose, Bld 156 (*)    Creatinine, Ser 1.40 (*)    Calcium 8.1 (*)    Albumin 2.8 (*)    GFR, Estimated 45 (*)    All other components within normal limits  CBC WITH  DIFFERENTIAL/PLATELET - Abnormal; Notable for the following components:   WBC 11.1 (*)    Hemoglobin 10.2 (*)    HCT 31.2 (*)    MCV 75.7 (*)    MCH 24.8 (*)    RDW 17.4 (*)    Neutro Abs 7.9 (*)    All other components within normal limits  APTT - Abnormal; Notable for the following components:   aPTT 39 (*)    All other components within normal limits  I-STAT BETA HCG BLOOD,  ED ([facility], [facility], [facility] ONLY) - Abnormal; Notable for the following components:   I-stat hCG, quantitative 18.3 (*)    All other components within normal limits  CULTURE, BLOOD (ROUTINE X 2)  CULTURE, BLOOD (ROUTINE X 2)  URINE CULTURE  LACTIC ACID, PLASMA  PROTIME-INR  LACTIC ACID, PLASMA  URINALYSIS, ROUTINE W REFLEX MICROSCOPIC  POC SARS CORONAVIRUS 2 AG -  ED    EKG None  Radiology DG Chest Port 1 View  Result Date: 08/05/2020 CLINICAL DATA:  Sepsis, cough EXAM: PORTABLE CHEST 1 VIEW COMPARISON:  08/01/2020 FINDINGS: The heart size and mediastinal contours are within normal limits. Both lungs are clear. The visualized skeletal structures are unremarkable. IMPRESSION: No active disease. Electronically Signed   By: Helyn Numbers MD   On: 08/05/2020 01:10    Procedures .Critical Care Performed by: Marily Memos, MD Authorized by: Marily Memos, MD   Critical care provider statement:    Critical care time (minutes):  75   Critical care was necessary to treat or prevent imminent or life-threatening deterioration of the following conditions:  Sepsis, circulatory failure and cardiac failure   Critical care was time spent personally by me on the following activities:  Discussions with consultants, evaluation of patient's response to treatment, examination of patient, ordering and performing treatments and interventions, ordering and review of laboratory studies, ordering and review of radiographic studies, pulse oximetry, re-evaluation of patient's condition, obtaining history from patient or surrogate and review  of old charts   (including critical care time)  Medications Ordered in ED Medications  lactated ringers infusion (has no administration in time range)  metroNIDAZOLE (FLAGYL) IVPB 500 mg (500 mg Intravenous New Bag/Given 08/05/20 0136)  vancomycin (VANCOCIN) 2,500 mg in sodium chloride 0.9 % 500 mL IVPB (has no administration in time range)  ceFEPIme (MAXIPIME) 2 g in sodium chloride 0.9 % 100 mL IVPB (2 g Intravenous New Bag/Given 08/05/20 0135)  lactated ringers bolus 2,000 mL (2,000 mLs Intravenous New Bag/Given 08/05/20 0112)    ED Course  I have reviewed the triage vital signs and the nursing notes.  Pertinent labs & imaging results that were available during my care of the patient were reviewed by me and considered in my medical decision making (see chart for details).    MDM Rules/Calculators/A&P                         Code sepsis initiated on arrival. antibitotics for unknown source startted however it seems to be pulmonary in nature.  Overall her infectious work-up was relatively unremarkable.  She did go into A. fib with rapid ventricular response.  She has some ST changes related to the surgery troponin was done which showed an elevated troponin 1200.  Started on heparin.  No meningismus or concern for meningitis at this time.  One possible thought was a myocarditis.  Her Covid test was negative.  At this time will admit to hospitalist for further management of the same  Final Clinical Impression(s) / ED Diagnoses Final diagnoses:  Shortness of breath  Sepsis, due to unspecified organism, unspecified whether acute organ dysfunction present [facility])  Atrial fibrillation with rapid ventricular response (HCC)  NSTEMI (non-ST elevated myocardial infarction) [facility])    Rx / DC Orders ED Discharge Orders    None       Eleesha Purkey, Barbara Cower, MD 08/06/20 [phone]

## 2020-08-05 NOTE — ED Notes (Addendum)
Unable to titrate patient blood pressure due to her bp. Pt bp is running on the lower end of limits.

## 2020-08-06 ENCOUNTER — Inpatient Hospital Stay (HOSPITAL_COMMUNITY): Payer: Medicaid Other

## 2020-08-06 DIAGNOSIS — I4891 Unspecified atrial fibrillation: Secondary | ICD-10-CM

## 2020-08-06 DIAGNOSIS — I214 Non-ST elevation (NSTEMI) myocardial infarction: Secondary | ICD-10-CM

## 2020-08-06 DIAGNOSIS — N179 Acute kidney failure, unspecified: Secondary | ICD-10-CM

## 2020-08-06 DIAGNOSIS — A419 Sepsis, unspecified organism: Secondary | ICD-10-CM | POA: Diagnosis not present

## 2020-08-06 LAB — C-REACTIVE PROTEIN: CRP: <0.5 mg/dL (ref <1.0)

## 2020-08-06 LAB — HEPARIN LEVEL (UNFRACTIONATED)
Heparin Unfractionated: 1.36 IU/mL — ABNORMAL HIGH (ref 0.30–0.70)
Heparin Unfractionated: <0.1 IU/mL — ABNORMAL LOW (ref 0.30–0.70)

## 2020-08-06 LAB — COMPREHENSIVE METABOLIC PANEL
ALT: 29 U/L (ref 0–44)
AST: 21 U/L (ref 15–41)
Albumin: 2.2 g/dL — ABNORMAL LOW (ref 3.5–5.0)
Alkaline Phosphatase: 25 U/L — ABNORMAL LOW (ref 38–126)
Anion gap: 11 (ref 5–15)
BUN: 50 mg/dL — ABNORMAL HIGH (ref 6–20)
CO2: 21 mmol/L — ABNORMAL LOW (ref 22–32)
Calcium: 8.5 mg/dL — ABNORMAL LOW (ref 8.9–10.3)
Chloride: 109 mmol/L (ref 98–111)
Creatinine, Ser: 1.97 mg/dL — ABNORMAL HIGH (ref 0.44–1.00)
GFR, Estimated: 30 mL/min — ABNORMAL LOW (ref >60)
Glucose, Bld: 102 mg/dL — ABNORMAL HIGH (ref 70–99)
Potassium: 4 mmol/L (ref 3.5–5.1)
Sodium: 141 mmol/L (ref 135–145)
Total Bilirubin: 1.1 mg/dL (ref 0.3–1.2)
Total Protein: 5.7 g/dL — ABNORMAL LOW (ref 6.5–8.1)

## 2020-08-06 LAB — CBC
HCT: 32.3 % — ABNORMAL LOW (ref 36.0–46.0)
Hemoglobin: 10.5 g/dL — ABNORMAL LOW (ref 12.0–15.0)
MCH: 26.9 pg (ref 26.0–34.0)
MCHC: 32.5 g/dL (ref 30.0–36.0)
MCV: 82.8 fL (ref 80.0–100.0)
Platelets: 338 10*3/uL (ref 150–400)
RBC: 3.9 MIL/uL (ref 3.87–5.11)
RDW: 14.6 % (ref 11.5–15.5)
WBC: 8.9 10*3/uL (ref 4.0–10.5)
nRBC: 0 % (ref 0.0–0.2)

## 2020-08-06 LAB — ECHOCARDIOGRAM COMPLETE
Area-P 1/2: 3.06 cm2
Calc EF: 69.5 %
Height: 64 in
S' Lateral: 2.8 cm
Single Plane A2C EF: 72.4 %
Single Plane A4C EF: 66.3 %
Weight: 5389.81 oz

## 2020-08-06 LAB — D-DIMER, QUANTITATIVE: D-Dimer, Quant: 9.29 ug/mL-FEU — ABNORMAL HIGH (ref 0.00–0.50)

## 2020-08-06 LAB — URINE CULTURE

## 2020-08-06 LAB — FERRITIN: Ferritin: 260 ng/mL (ref 11–307)

## 2020-08-06 MED ORDER — DILTIAZEM HCL ER COATED BEADS 120 MG PO CP24
120.0000 mg | ORAL_CAPSULE | Freq: Two times a day (BID) | ORAL | Status: DC
  Administered 2020-08-06: 120 mg via ORAL
  Filled 2020-08-06 (×2): qty 1

## 2020-08-06 MED ORDER — HEPARIN (PORCINE) 25000 UT/250ML-% IV SOLN
1900.0000 [IU]/h | INTRAVENOUS | Status: DC
  Filled 2020-08-06 (×2): qty 250

## 2020-08-06 MED ORDER — COLCHICINE 0.6 MG PO TABS
0.6000 mg | ORAL_TABLET | Freq: Every day | ORAL | Status: DC
  Administered 2020-08-07 – 2020-08-10 (×5): 0.6 mg via ORAL
  Filled 2020-08-06 (×4): qty 1

## 2020-08-06 NOTE — Progress Notes (Signed)
  Echocardiogram 2D Echocardiogram has been performed.  Sheri Park 08/06/2020, 12:12 PM

## 2020-08-06 NOTE — Progress Notes (Signed)
Pt. Arrived via stretcher from ED. GCS-15 with VSS. Fluids infusing per orders. Covid isolation explained to pt with pt. Verbalizing understanding. Orders given and carried out. See PCR for vitals.Will continue to monitor closely.

## 2020-08-06 NOTE — Progress Notes (Signed)
ANTICOAGULATION CONSULT NOTE - Follow  Up Consult  Pharmacy Consult for heparin Indication: chest pain/ACS and atrial fibrillation  Allergies  Allergen Reactions  . Peanut-Containing Drug Products Anaphylaxis    Oil, nuts, anything peanut related  . Shellfish Allergy Anaphylaxis  . Codeine Itching  . Ketorolac Tromethamine Swelling  . Toradol [Ketorolac Tromethamine] Swelling  . Mucinex [Guaifenesin Er] Rash    Patient Measurements: Height: 5\' 4"  (162.6 cm) Weight: (!) 152.8 kg (336 lb 13.8 oz) IBW/kg (Calculated) : 54.7 Heparin Dosing Weight: 95g  Vital Signs: Temp: 97.6 F (36.4 C) (01/24 1634) Temp Source: Oral (01/24 1634) BP: 126/83 (01/24 1634) Pulse Rate: 95 (01/24 1634)  Labs: Recent Labs    08/05/20 0115 08/05/20 0422 08/05/20 0729 08/05/20 0732 08/05/20 2205 08/06/20 0712 08/06/20 1545  HGB 10.2*  --   --  9.8*  --  10.5*  --   HCT 31.2*  --   --  32.1*  --  32.3*  --   PLT 275  --   --  258  --  338  --   APTT 39*  --   --   --   --   --   --   LABPROT 13.7  --   --   --   --   --   --   INR 1.1  --   --   --   --   --   --   HEPARINUNFRC  --   --   --   --  <0.10* 1.36* <0.10*  CREATININE 1.40*  --  1.18*  --   --  1.97*  --   TROPONINIHS  --  1,248* 1,029*  --   --   --   --     Estimated Creatinine Clearance: 49 mL/min (A) (by C-G formula based on SCr of 1.97 mg/dL (H)).   Medical History: Past Medical History:  Diagnosis Date  . Asthma   . Hypertension     Assessment: 55yo female w/ recent Covid infection (initially positive 1/10, now testing negative) c/o SOB and CP; found to be in Afib with elevated troponin, to begin heparin. VQ scan negative for PE  Heparin drip started 1/23 with initial HL < 0.1, heparin re-bolused 4000 utsx1 then increased 1400>1800 uts/hr.  HL now 1.3 > goal. Heparin held and decreased to 1500 units/hr - level now <0.1. No bleeding noted, cbc stable Afib RVR > SR now 80s on dilitazem drip > added po metoprolol    Goal of Therapy:  Heparin level 0.3-0.7 units/ml Monitor platelets by anticoagulation protocol: Yes   Plan:  Increase heparin drip to 1700 uts/hr  Check heparin level in 6h  Daily HL, CBC Monitor s/s bleeding  And follow up with MD convert to oral AC when w/u complete   2/23, PharmD, [facility] Clinical Pharmacist Please see AMION for all Pharmacists' Contact Phone Numbers 08/06/2020, 4:51 PM

## 2020-08-06 NOTE — Progress Notes (Addendum)
PROGRESS NOTE    Sheri Park  AUQ:[id number] DOB: 01-15-1966 DOA: 08/05/2020 PCP: Orpah Cobb, MD  Brief Narrative: This is a 55 year old female with history of asthma, hypertension, morbid obesity, iron deficiency anemia presented to the ED early this morning with cough congestion fatigue shortness of breath chest pain fever and chills.  She reports having a COVID exposure over 2 weeks ago, [place], she is unvaccinated since 1/14 started developing cough congestion fatigue body aches etc., started taking Mucinex and then developed pruritic rash, last couple of days has had left-sided chest tightness, went to urgent care recently and had another COVID PCR which was negative -Came to the emergency room today noted to be tachycardic with A. fib RVR, heart rate in the 150s, white count of 11.1, hemoglobin of 10.2 creatinine of 1.4 abnormal urinalysis, chest x-ray was unremarkable -Started on a Cardizem drip, along with fluid boluses  Assessment & Plan:   Atrial fibrillation with RVR -Improved, converted to sinus rhythm, off Cardizem. -Continue oral metoprolol -Follow-up echocardiogram -CTA chest negative for PE  NSTEMi vs Demand ischemia -Suspected to be secondary to A. fib RVR, prior left heart cath in 2018 which noted normal coronaries -EKG with ST depression in inferior leads and ST elevation in V2 -Continue metoprolol and IV heparin today, appreciate cardiology input from Dr. Algie Coffer -Follow-up 2D echocardiogram  SARS COVID 19 infection, COVID pneumonia -Tested positive on 1/10 -She is unvaccinated, initially we were concerned for PE with her symptoms, CTA was negative for pulmonary embolism but did show bilateral infiltrates concerning for Covid pneumonia -Continue IV remdesivir and Decadron, day 2 -Stable off IV antibiotics -Will complete 3-week isolation on 1/31  Urticaria -Drug rash following Mucinex use, continue  Claritin  Mild AKI -Resolved with hydration, monitor  Morbid obesity  -BMI is 57.8, needs lifestyle modification  Abnormal i-STAT hCG was a lab error, serum hCG is negative  DVT prophylaxis: Heparin drip Code Status: Full code Family Communication: No family at bedside Disposition Plan:  Status is: Inpatient  Remains inpatient appropriate because:Inpatient level of care appropriate due to severity of illness   Dispo: The patient is from: Home              Anticipated d/c is to: Home              Anticipated d/c date is: > 3 days              Patient currently is not medically stable to d/c.   Difficult to place patient No  Consultants:   Cardiology   Procedures:   Antimicrobials:    Subjective: -Feels much better today, cough congestion and dyspnea are improving  Objective: Vitals:   08/06/20 0215 08/06/20 0403 08/06/20 0902 08/06/20 1119  BP:  118/87 (!) 135/93 125/80  Pulse: 84 80 88 92  Resp: (!) 26 20 19 18   Temp:  98 F (36.7 C) 98 F (36.7 C) (!) 97.4 F (36.3 C)  TempSrc:  Oral Oral Oral  SpO2: 94% 96% 97% 96%  Weight:      Height:       No intake or output data in the 24 hours ending 08/06/20 1355 Filed Weights   08/05/20 0700  Weight: (!) 152.8 kg    Examination:  General exam: Obese pleasant female sitting up in bed, appears much more comfortable, AAOx3, no distress CVS: S1-S2, regular rate and rhythm Lungs: Distant breath sounds, otherwise clear Abdomen:  Soft, nontender, bowel sounds present Deformities: No edema  Neuro: Moves all extremities, no localizing signs Skin: Rash on her arms especially right upper extremity Psych: Appropriate mood and affect  Data Reviewed:   CBC: Recent Labs  Lab 08/01/20 0818 08/05/20 0115 08/05/20 0732 08/06/20 0712  WBC 6.2 11.1* 9.3 8.9  NEUTROABS  --  7.9*  --   --   HGB 11.6* 10.2* 9.8* 10.5*  HCT 36.4 31.2* 32.1* 32.3*  MCV 75.8* 75.7* 78.1* 82.8  PLT 291 275 258 338   Basic Metabolic  Panel: Recent Labs  Lab 08/01/20 0818 08/05/20 0115 08/05/20 0729 08/06/20 0712  NA 140 134* 136 141  K 3.8 3.0* 3.3* 4.0  CL 104 101 102 109  CO2 25 22 23  21*  GLUCOSE 103* 156* 113* 102*  BUN 7 11 11  50*  CREATININE 0.86 1.40* 1.18* 1.97*  CALCIUM 9.0 8.1* 8.0* 8.5*  MG  --   --  1.8  --    GFR: Estimated Creatinine Clearance: 49 mL/min (A) (by C-G formula based on SCr of 1.97 mg/dL (H)). Liver Function Tests: Recent Labs  Lab 08/05/20 0115 08/06/20 0712  AST 27 21  ALT 18 29  ALKPHOS 47 25*  BILITOT 1.1 1.1  PROT 6.8 5.7*  ALBUMIN 2.8* 2.2*   No results for input(s): LIPASE, AMYLASE in the last 168 hours. No results for input(s): AMMONIA in the last 168 hours. Coagulation Profile: Recent Labs  Lab 08/05/20 0115  INR 1.1   Cardiac Enzymes: No results for input(s): CKTOTAL, CKMB, CKMBINDEX, TROPONINI in the last 168 hours. BNP (last 3 results) No results for input(s): PROBNP in the last 8760 hours. HbA1C: No results for input(s): HGBA1C in the last 72 hours. CBG: No results for input(s): GLUCAP in the last 168 hours. Lipid Profile: No results for input(s): CHOL, HDL, LDLCALC, TRIG, CHOLHDL, LDLDIRECT in the last 72 hours. Thyroid Function Tests: Recent Labs    08/05/20 0732  TSH 1.264   Anemia Panel: Recent Labs    08/06/20 1610  FERRITIN 260   Urine analysis:    Component Value Date/Time   COLORURINE AMBER (A) 08/05/2020 0420   APPEARANCEUR TURBID (A) 08/05/2020 0420   LABSPEC 1.017 08/05/2020 0420   PHURINE 5.0 08/05/2020 0420   GLUCOSEU NEGATIVE 08/05/2020 0420   HGBUR LARGE (A) 08/05/2020 0420   BILIRUBINUR NEGATIVE 08/05/2020 0420   KETONESUR NEGATIVE 08/05/2020 0420   PROTEINUR 100 (A) 08/05/2020 0420   UROBILINOGEN 0.2 12/22/2019 1136   NITRITE NEGATIVE 08/05/2020 0420   LEUKOCYTESUR NEGATIVE 08/05/2020 0420   Sepsis Labs: @LABRCNTIP (procalcitonin:4,lacticidven:4)  ) Recent Results (from the past 240 hour(s))  SARS CORONAVIRUS  2 (TAT 6-24 HRS) Nasopharyngeal Nasopharyngeal Swab     Status: None   Collection Time: 08/01/20  2:56 PM   Specimen: Nasopharyngeal Swab  Result Value Ref Range Status   SARS Coronavirus 2 NEGATIVE NEGATIVE Final    Comment: (NOTE) SARS-CoV-2 target nucleic acids are NOT DETECTED.  The SARS-CoV-2 RNA is generally detectable in upper and lower respiratory specimens during the acute phase of infection. Negative results do not preclude SARS-CoV-2 infection, do not rule out co-infections with other pathogens, and should not be used as the sole basis for treatment or other patient management decisions. Negative results must be combined with clinical observations, patient history, and epidemiological information. The expected result is Negative.  Fact Sheet for Patients: HairSlick.no  Fact Sheet for Healthcare Providers: quierodirigir.com  This test is not yet approved or cleared by  the [employer] and  has been authorized for detection and/or diagnosis of SARS-CoV-2 by FDA under an Emergency Use Authorization (EUA). This EUA will remain  in effect (meaning this test can be used) for the duration of the COVID-19 declaration under Se ction 564(b)(1) of the Act, 21 U.S.C. section 360bbb-3(b)(1), unless the authorization is terminated or revoked sooner.  Performed at [facility], [address] [place], [place] [address]   Blood Culture (routine x 2)     Status: None (Preliminary result)   Collection Time: 08/05/20  1:15 AM   Specimen: BLOOD  Result Value Ref Range Status   Specimen Description BLOOD RIGHT ANTECUBITAL  Final   Special Requests   Final    BOTTLES DRAWN AEROBIC AND ANAEROBIC Blood Culture adequate volume   Culture   Final    NO GROWTH < 12 HOURS Performed at [facility], [address], [place], [place] [address]    Report Status PENDING  Incomplete  Urine culture     Status: Abnormal    Collection Time: 08/05/20  4:13 AM   Specimen: Urine, Random  Result Value Ref Range Status   Specimen Description URINE, RANDOM  Final   Special Requests   Final    NONE Performed at [facility], [address], [place], [place] [address]    Culture MULTIPLE SPECIES PRESENT, SUGGEST RECOLLECTION (A)  Final   Report Status 08/06/2020 FINAL  Final         Radiology Studies: CT ANGIO CHEST PE W OR WO CONTRAST  Result Date: 08/05/2020 CLINICAL DATA:  Patient with cough, congestion and fatigue. Evaluate for pulmonary embolus. EXAM: CT ANGIOGRAPHY CHEST WITH CONTRAST TECHNIQUE: Multidetector CT imaging of the chest was performed using the standard protocol during bolus administration of intravenous contrast. Multiplanar CT image reconstructions and MIPs were obtained to evaluate the vascular anatomy. CONTRAST:  75mL OMNIPAQUE IOHEXOL 350 MG/ML SOLN COMPARISON:  Chest radiograph earlier same day. FINDINGS: Cardiovascular: Heart is enlarged. Small volume pericardial effusion. Edema within the superior mediastinal soft tissues. Adequate opacification of the pulmonary artery. Motion artifact limits evaluation. No evidence for central, main, lobar or proximal segmental pulmonary embolus. Evaluation more distally is limited due to motion artifact. Mediastinum/Nodes: No enlarged axillary, mediastinal hilar lymphadenopathy. Small hiatal hernia. Lungs/Pleura: There are scattered patchy areas of ground-glass attenuation within the lungs bilaterally. No pleural effusion or pneumothorax. Atelectasis within the lung bases bilaterally. Upper Abdomen: No acute process. Musculoskeletal: Thoracic spine degenerative changes. No aggressive or acute appearing osseous lesions. Review of the MIP images confirms the above findings. IMPRESSION: 1. No evidence for acute pulmonary embolus. Evaluation more distally is limited due to motion artifact. 2. Cardiomegaly. Small volume pericardial effusion. 3. Scattered patchy  areas of ground-glass attenuation predominately within the lungs bilaterally which may represent atypical infectious process (COVID-19). Electronically Signed   By: Annia Belt M.D.   On: 08/05/2020 13:19   DG Chest Port 1 View  Result Date: 08/05/2020 CLINICAL DATA:  Sepsis, cough EXAM: PORTABLE CHEST 1 VIEW COMPARISON:  08/01/2020 FINDINGS: The heart size and mediastinal contours are within normal limits. Both lungs are clear. The visualized skeletal structures are unremarkable. IMPRESSION: No active disease. Electronically Signed   By: Helyn Numbers MD   On: 08/05/2020 01:10   ECHOCARDIOGRAM COMPLETE  Result Date: 08/06/2020    ECHOCARDIOGRAM REPORT   Patient Name:   Sheri Park Date of Exam: 08/06/2020 Medical Rec #:  [id number]  Height:       64.0 in Accession #:    [id number]          Weight:       336.9 lb Date of Birth:  09-19-1965          BSA:          2.442 m Patient Age:    54 years            BP:           135/93 mmHg Patient Gender: F                   HR:           82 bpm. Exam Location:  Inpatient Procedure: 2D Echo, 3D Echo, Cardiac Doppler and Color Doppler Indications:     I48.91* Unspeicified atrial fibrillation  History:         Patient has no prior history of Echocardiogram examinations.                  Abnormal ECG, Arrythmias:Atrial Fibrillation;                  Signs/Symptoms:Bacteremia and Chest Pain. Covid positive.  Sonographer:     Sheralyn Boatmanina West RDCS Referring Phys:  [id number] Orpah CobbAJAY KADAKIA Diagnosing Phys: Orpah CobbAjay Kadakia MD  Sonographer Comments: Patient is morbidly obese. Image acquisition challenging due to patient body habitus. Algie CofferKadakia to read. IMPRESSIONS  1. Left ventricular ejection fraction, by estimation, is 65 to 70%. The left ventricle has normal function. The left ventricle has no regional wall motion abnormalities. There is moderate concentric left ventricular hypertrophy. Left ventricular diastolic parameters are consistent with Grade I diastolic dysfunction  (impaired relaxation).  2. Right ventricular systolic function is normal. The right ventricular size is normal. There is moderately elevated pulmonary artery systolic pressure.  3. Left atrial size was moderately dilated.  4. Right atrial size was mildly dilated.  5. The pericardial effusion is circumferential. There is no evidence of cardiac tamponade.  6. The mitral valve is normal in structure. Mild mitral valve regurgitation.  7. The aortic valve is tricuspid. Aortic valve regurgitation is not visualized.  8. The inferior vena cava is dilated in size with <50% respiratory variability, suggesting right atrial pressure of 15 mmHg. FINDINGS  Left Ventricle: Left ventricular ejection fraction, by estimation, is 65 to 70%. The left ventricle has normal function. The left ventricle has no regional wall motion abnormalities. The left ventricular internal cavity size was normal in size. There is  moderate concentric left ventricular hypertrophy. Left ventricular diastolic parameters are consistent with Grade I diastolic dysfunction (impaired relaxation). Right Ventricle: The right ventricular size is normal. No increase in right ventricular wall thickness. Right ventricular systolic function is normal. There is moderately elevated pulmonary artery systolic pressure. The tricuspid regurgitant velocity is 2.82 m/s, and with an assumed right atrial pressure of 15 mmHg, the estimated right ventricular systolic pressure is 46.8 mmHg. Left Atrium: Left atrial size was moderately dilated. Right Atrium: Right atrial size was mildly dilated. Pericardium: Trivial pericardial effusion is present. The pericardial effusion is circumferential. There is no evidence of cardiac tamponade. Mitral Valve: The mitral valve is normal in structure. Mild mitral valve regurgitation. Tricuspid Valve: The tricuspid valve is normal in structure. Tricuspid valve regurgitation is mild. Aortic Valve: The aortic valve is tricuspid. Aortic valve  regurgitation is not visualized. Pulmonic Valve: The pulmonic valve was normal in structure. Pulmonic valve regurgitation is not visualized.  Aorta: The aortic root is normal in size and structure. There is minimal (Grade I) atheroma plaque involving the aortic root. Venous: The inferior vena cava is dilated in size with less than 50% respiratory variability, suggesting right atrial pressure of 15 mmHg. IAS/Shunts: The interatrial septum was not assessed.  LEFT VENTRICLE PLAX 2D LVIDd:         5.00 cm      Diastology LVIDs:         2.80 cm      LV e' medial:    7.83 cm/s LV PW:         1.50 cm      LV E/e' medial:  16.6 LV IVS:        1.40 cm      LV e' lateral:   10.00 cm/s LVOT diam:     2.20 cm      LV E/e' lateral: 13.0 LV SV:         124 LV SV Index:   51 LVOT Area:     3.80 cm  LV Volumes (MOD) LV vol d, MOD A2C: 116.0 ml LV vol d, MOD A4C: 73.2 ml LV vol s, MOD A2C: 32.0 ml LV vol s, MOD A4C: 24.7 ml LV SV MOD A2C:     84.0 ml LV SV MOD A4C:     73.2 ml LV SV MOD BP:      65.3 ml RIGHT VENTRICLE RV S prime:     18.60 cm/s TAPSE (M-mode): 2.6 cm LEFT ATRIUM             Index       RIGHT ATRIUM           Index LA diam:        3.50 cm 1.43 cm/m  RA Area:     19.10 cm LA Vol (A2C):   64.8 ml 26.54 ml/m RA Volume:   59.80 ml  24.49 ml/m LA Vol (A4C):   70.0 ml 28.67 ml/m LA Biplane Vol: 71.8 ml 29.41 ml/m  AORTIC VALVE LVOT Vmax:   153.00 cm/s LVOT Vmean:  113.000 cm/s LVOT VTI:    0.327 m  AORTA Ao Root diam: 3.30 cm MITRAL VALVE                TRICUSPID VALVE MV Area (PHT): 3.06 cm     TR Peak grad:   31.8 mmHg MV Decel Time: 248 msec     TR Vmax:        282.00 cm/s MV E velocity: 130.00 cm/s MV A velocity: 119.00 cm/s  SHUNTS MV E/A ratio:  1.09         Systemic VTI:  0.33 m                             Systemic Diam: 2.20 cm Orpah Cobb MD Electronically signed by Orpah Cobb MD Signature Date/Time: 08/06/2020/12:38:34 PM    Final    VAS [place] LOWER EXTREMITY VENOUS (DVT)  Result Date: 08/05/2020   Lower Venous DVT Study Indications: Edema, SOB.  Limitations: Body habitus and poor ultrasound/tissue interface. Comparison Study: No prior studies. Performing Technologist: Jean Rosenthal, RDMS  Examination Guidelines: A complete evaluation includes B-mode imaging, spectral Doppler, color Doppler, and power Doppler as needed of all accessible portions of each vessel. Bilateral testing is considered an integral part of a complete examination. Limited examinations for reoccurring indications may be performed as noted. The  reflux portion of the exam is performed with the patient in reverse Trendelenburg.  +---------+---------------+---------+-----------+----------+--------------+ RIGHT    CompressibilityPhasicitySpontaneityPropertiesThrombus Aging +---------+---------------+---------+-----------+----------+--------------+ CFV      Full           Yes      Yes                                 +---------+---------------+---------+-----------+----------+--------------+ SFJ      Full                                                        +---------+---------------+---------+-----------+----------+--------------+ FV Prox  Full                                                        +---------+---------------+---------+-----------+----------+--------------+ FV Mid                  Yes      Yes                                 +---------+---------------+---------+-----------+----------+--------------+ FV DistalFull           Yes      Yes                                 +---------+---------------+---------+-----------+----------+--------------+ PFV      Full                                                        +---------+---------------+---------+-----------+----------+--------------+ POP      Full           Yes      Yes                                 +---------+---------------+---------+-----------+----------+--------------+ PTV      Full                                                         +---------+---------------+---------+-----------+----------+--------------+   +---------+---------------+---------+-----------+----------+--------------+ LEFT     CompressibilityPhasicitySpontaneityPropertiesThrombus Aging +---------+---------------+---------+-----------+----------+--------------+ CFV      Full           Yes      Yes                                 +---------+---------------+---------+-----------+----------+--------------+ SFJ      Full                                                        +---------+---------------+---------+-----------+----------+--------------+  FV Prox  Full                                                        +---------+---------------+---------+-----------+----------+--------------+ FV Mid                  Yes      Yes                                 +---------+---------------+---------+-----------+----------+--------------+ FV Distal               Yes      Yes                                 +---------+---------------+---------+-----------+----------+--------------+ PFV      Full                                                        +---------+---------------+---------+-----------+----------+--------------+ POP      Full           Yes      Yes                                 +---------+---------------+---------+-----------+----------+--------------+ PTV      Full           Yes      Yes                                 +---------+---------------+---------+-----------+----------+--------------+     Summary: RIGHT: - There is no evidence of deep vein thrombosis in the lower extremity. However, portions of this examination were limited- see technologist comments above.  - No cystic structure found in the popliteal fossa.  LEFT: - There is no evidence of deep vein thrombosis in the lower extremity. However, portions of this examination were limited- see technologist comments above.  - No  cystic structure found in the popliteal fossa.  *See table(s) above for measurements and observations. Electronically signed by Waverly Ferrari MD on 08/05/2020 at 11:57:07 AM.    Final         Scheduled Meds: . colchicine  0.6 mg Oral Daily  . diltiazem  120 mg Oral BID  . loratadine  10 mg Oral Daily  . methylPREDNISolone (SOLU-MEDROL) injection  60 mg Intravenous Q12H  . metoprolol tartrate  12.5 mg Oral BID  . pantoprazole  40 mg Oral Q1200   Continuous Infusions: . heparin    . remdesivir 100 mg in NS 100 mL 100 mg (08/06/20 0941)     LOS: 1 day    Time spent:    Zannie Cove, MD [facility]  08/06/2020, 1:55 PM

## 2020-08-06 NOTE — Progress Notes (Signed)
ANTICOAGULATION CONSULT NOTE - Follow  Up Consult  Pharmacy Consult for heparin Indication: chest pain/ACS and atrial fibrillation  Allergies  Allergen Reactions  . Peanut-Containing Drug Products Anaphylaxis    Oil, nuts, anything peanut related  . Shellfish Allergy Anaphylaxis  . Codeine Itching  . Ketorolac Tromethamine Swelling  . Toradol [Ketorolac Tromethamine] Swelling  . Mucinex [Guaifenesin Er] Rash    Patient Measurements: Height: 5\' 4"  (162.6 cm) Weight: (!) 152.8 kg (336 lb 13.8 oz) IBW/kg (Calculated) : 54.7 Heparin Dosing Weight: 95g  Vital Signs: Temp: 98 F (36.7 C) (01/24 0902) Temp Source: Oral (01/24 0902) BP: 135/93 (01/24 0902) Pulse Rate: 88 (01/24 0902)  Labs: Recent Labs    08/05/20 0115 08/05/20 0422 08/05/20 0729 08/05/20 0732 08/05/20 2205 08/06/20 0712  HGB 10.2*  --   --  9.8*  --  10.5*  HCT 31.2*  --   --  32.1*  --  32.3*  PLT 275  --   --  258  --  338  APTT 39*  --   --   --   --   --   LABPROT 13.7  --   --   --   --   --   INR 1.1  --   --   --   --   --   HEPARINUNFRC  --   --   --   --  <0.10* 1.36*  CREATININE 1.40*  --  1.18*  --   --  1.97*  TROPONINIHS  --  1,248* 1,029*  --   --   --     Estimated Creatinine Clearance: 49 mL/min (A) (by C-G formula based on SCr of 1.97 mg/dL (H)).   Medical History: Past Medical History:  Diagnosis Date  . Asthma   . Hypertension     Assessment: 55yo female w/ recent Covid infection (initially positive 1/10, now testing negative) c/o SOB and CP; found to be in Afib with elevated troponin, to begin heparin. VQ scan negative for PE  Heparin drip started 1/23 with initial HL < 0.1, heparin re-bolused 4000 utsx1 then increased 1400>1800 uts/hr.  HL now 1.3 > goal.  No bleeding noted, cbc stable Afib RVR > SR now 80s on dilitazem drip > added po metoprolol   Goal of Therapy:  Heparin level 0.3-0.7 units/ml Monitor platelets by anticoagulation protocol: Yes   Plan:  Will hold  heparin 2/23 and  Resume heparin drip at lower rate 1500 uts/hr  Check heparin level in 6h  Daily HL, CBC Monitor s/s bleeding  And follow up with MD convert to oral AC when w/u complete   Pharm.D. CPP, BCPS Clinical Pharmacist [phone] 08/06/2020 9:20 AM

## 2020-08-06 NOTE — Consult Note (Signed)
Ref: Orpah Cobb, MD   Subjective:  Shortness of breath and cough continues. T max 102.9 degree F. CT scan with minimal pericardial effusion and echocardiogram with no wall motion abnormality MI is less likely and may have myocarditis/pericarditis and demand ischemia giving rise to Troponin I abnormality. She is back in sinus rhythm.  Objective:  Vital Signs in the last 24 hours: Temp:  [97.4 F (36.3 C)-102.9 F (39.4 C)] 97.4 F (36.3 C) (01/24 1119) Pulse Rate:  [75-124] 92 (01/24 1119) Cardiac Rhythm: Normal sinus rhythm;Bundle branch block (01/24 0700) Resp:  [0-34] 18 (01/24 1119) BP: (103-163)/(65-93) 125/80 (01/24 1119) SpO2:  [88 %-99 %] 96 % (01/24 1119)  Physical Exam: BP Readings from Last 1 Encounters:  08/06/20 125/80     Wt Readings from Last 1 Encounters:  08/05/20 (!) 152.8 kg    Weight change:  Body mass index is 57.82 kg/m. HEENT: [place]/AT, Eyes-Brown, Conjunctiva-Pink, Sclera-Non-icteric Neck: + JVD, No bruit, Trachea midline. Lungs:  Clearing, Bilateral. Cardiac:  Regular rhythm, normal S1 and S2, no S3. II/VI systolic murmur. Abdomen:  Soft, non-tender. BS present. Extremities:  2 + edema present. No cyanosis. No clubbing. CNS: AxOx3, Cranial nerves grossly intact, moves all 4 extremities.  Skin: Warm and dry.   Intake/Output from previous day: No intake/output data recorded.    Lab Results: BMET    Component Value Date/Time   NA 141 08/06/2020 0712   NA 136 08/05/2020 0729   NA 134 (L) 08/05/2020 0115   K 4.0 08/06/2020 0712   K 3.3 (L) 08/05/2020 0729   K 3.0 (L) 08/05/2020 0115   CL 109 08/06/2020 0712   CL 102 08/05/2020 0729   CL 101 08/05/2020 0115   CO2 21 (L) 08/06/2020 0712   CO2 23 08/05/2020 0729   CO2 22 08/05/2020 0115   GLUCOSE 102 (H) 08/06/2020 0712   GLUCOSE 113 (H) 08/05/2020 0729   GLUCOSE 156 (H) 08/05/2020 0115   BUN 50 (H) 08/06/2020 0712   BUN 11 08/05/2020 0729   BUN 11 08/05/2020 0115   CREATININE 1.97 (H)  08/06/2020 0712   CREATININE 1.18 (H) 08/05/2020 0729   CREATININE 1.40 (H) 08/05/2020 0115   CALCIUM 8.5 (L) 08/06/2020 0712   CALCIUM 8.0 (L) 08/05/2020 0729   CALCIUM 8.1 (L) 08/05/2020 0115   GFRNONAA 30 (L) 08/06/2020 0712   GFRNONAA 55 (L) 08/05/2020 0729   GFRNONAA 45 (L) 08/05/2020 0115   GFRAA >60 05/06/2019 1723   GFRAA >60 01/08/2018 0131   GFRAA >60 10/21/2016 0504   CBC    Component Value Date/Time   WBC 8.9 08/06/2020 0712   RBC 3.90 08/06/2020 0712   HGB 10.5 (L) 08/06/2020 0712   HCT 32.3 (L) 08/06/2020 0712   PLT 338 08/06/2020 0712   MCV 82.8 08/06/2020 0712   MCH 26.9 08/06/2020 0712   MCHC 32.5 08/06/2020 0712   RDW 14.6 08/06/2020 0712   LYMPHSABS 2.0 08/05/2020 0115   MONOABS 0.8 08/05/2020 0115   EOSABS 0.3 08/05/2020 0115   BASOSABS 0.0 08/05/2020 0115   HEPATIC Function Panel Recent Labs    08/05/20 0115 08/06/20 0712  PROT 6.8 5.7*   HEMOGLOBIN A1C No components found for: HGA1C,  MPG CARDIAC ENZYMES Lab Results  Component Value Date   TROPONINI <0.03 10/17/2016   TROPONINI <0.03 10/17/2016   TROPONINI <0.03 10/17/2016   BNP No results for input(s): PROBNP in the last 8760 hours. TSH Recent Labs    08/05/20 0732  TSH 1.264  CHOLESTEROL No results for input(s): CHOL in the last 8760 hours.  Scheduled Meds: . loratadine  10 mg Oral Daily  . methylPREDNISolone (SOLU-MEDROL) injection  60 mg Intravenous Q12H  . metoprolol tartrate  12.5 mg Oral BID  . pantoprazole  40 mg Oral Q1200   Continuous Infusions: . diltiazem (CARDIZEM) infusion 15 mg/hr (08/05/20 2307)  . heparin    . remdesivir 100 mg in NS 100 mL 100 mg (08/06/20 0941)   PRN Meds:.acetaminophen **OR** acetaminophen, albuterol, chlorpheniramine-HYDROcodone  Assessment/Plan: Acute myocarditis/Pericarditis Abnormal troponin I from demand ischemia Acute diastolic left heart failure Paroxysmal atrial fibrillation Acute respiratory failure with hypoxia COVID-19  pneumonia PE ruled out DVT ruled out Morbid obesity Asthma Iron deficiency anemia  Continue medical treatment. Change IV diltiazem to PO.    LOS: 1 day   Time spent including chart review, lab review, examination, discussion with patient/Nurse/Physician : 30 min   Orpah Cobb  MD  08/06/2020, 12:40 PM

## 2020-08-07 DIAGNOSIS — I214 Non-ST elevation (NSTEMI) myocardial infarction: Secondary | ICD-10-CM | POA: Diagnosis not present

## 2020-08-07 DIAGNOSIS — N179 Acute kidney failure, unspecified: Secondary | ICD-10-CM | POA: Diagnosis not present

## 2020-08-07 DIAGNOSIS — A419 Sepsis, unspecified organism: Secondary | ICD-10-CM | POA: Diagnosis not present

## 2020-08-07 DIAGNOSIS — I4891 Unspecified atrial fibrillation: Secondary | ICD-10-CM | POA: Diagnosis not present

## 2020-08-07 LAB — C-REACTIVE PROTEIN: CRP: 17.8 mg/dL — ABNORMAL HIGH (ref <1.0)

## 2020-08-07 LAB — FERRITIN: Ferritin: 103 ng/mL (ref 11–307)

## 2020-08-07 LAB — CBC
HCT: 29.2 % — ABNORMAL LOW (ref 36.0–46.0)
Hemoglobin: 9.3 g/dL — ABNORMAL LOW (ref 12.0–15.0)
MCH: 24.3 pg — ABNORMAL LOW (ref 26.0–34.0)
MCHC: 31.8 g/dL (ref 30.0–36.0)
MCV: 76.4 fL — ABNORMAL LOW (ref 80.0–100.0)
Platelets: 304 10*3/uL (ref 150–400)
RBC: 3.82 MIL/uL — ABNORMAL LOW (ref 3.87–5.11)
RDW: 17.2 % — ABNORMAL HIGH (ref 11.5–15.5)
WBC: 13.4 10*3/uL — ABNORMAL HIGH (ref 4.0–10.5)
nRBC: 0 % (ref 0.0–0.2)

## 2020-08-07 LAB — COMPREHENSIVE METABOLIC PANEL
ALT: 21 U/L (ref 0–44)
AST: 20 U/L (ref 15–41)
Albumin: 2.5 g/dL — ABNORMAL LOW (ref 3.5–5.0)
Alkaline Phosphatase: 50 U/L (ref 38–126)
Anion gap: 10 (ref 5–15)
BUN: 16 mg/dL (ref 6–20)
CO2: 22 mmol/L (ref 22–32)
Calcium: 8.4 mg/dL — ABNORMAL LOW (ref 8.9–10.3)
Chloride: 104 mmol/L (ref 98–111)
Creatinine, Ser: 1.07 mg/dL — ABNORMAL HIGH (ref 0.44–1.00)
GFR, Estimated: >60 mL/min (ref >60)
Glucose, Bld: 172 mg/dL — ABNORMAL HIGH (ref 70–99)
Potassium: 4.1 mmol/L (ref 3.5–5.1)
Sodium: 136 mmol/L (ref 135–145)
Total Bilirubin: 0.5 mg/dL (ref 0.3–1.2)
Total Protein: 6.5 g/dL (ref 6.5–8.1)

## 2020-08-07 LAB — HEPARIN LEVEL (UNFRACTIONATED): Heparin Unfractionated: 0.13 IU/mL — ABNORMAL LOW (ref 0.30–0.70)

## 2020-08-07 LAB — D-DIMER, QUANTITATIVE: D-Dimer, Quant: 5.81 ug/mL-FEU — ABNORMAL HIGH (ref 0.00–0.50)

## 2020-08-07 MED ORDER — METHYLPREDNISOLONE SODIUM SUCC 40 MG IJ SOLR
40.0000 mg | Freq: Two times a day (BID) | INTRAMUSCULAR | Status: DC
  Administered 2020-08-07 – 2020-08-08 (×2): 40 mg via INTRAVENOUS
  Filled 2020-08-07 (×2): qty 1

## 2020-08-07 MED ORDER — APIXABAN 5 MG PO TABS
5.0000 mg | ORAL_TABLET | Freq: Two times a day (BID) | ORAL | Status: DC
  Administered 2020-08-07 – 2020-08-10 (×8): 5 mg via ORAL
  Filled 2020-08-07 (×7): qty 1

## 2020-08-07 MED ORDER — DILTIAZEM HCL ER COATED BEADS 120 MG PO CP24
240.0000 mg | ORAL_CAPSULE | Freq: Every day | ORAL | Status: DC
  Administered 2020-08-07 – 2020-08-10 (×4): 240 mg via ORAL
  Filled 2020-08-07 (×3): qty 2

## 2020-08-07 MED ORDER — FUROSEMIDE 10 MG/ML IJ SOLN
20.0000 mg | Freq: Two times a day (BID) | INTRAMUSCULAR | Status: AC
  Administered 2020-08-07 (×2): 20 mg via INTRAVENOUS
  Filled 2020-08-07 (×2): qty 2

## 2020-08-07 MED ORDER — POTASSIUM CHLORIDE CRYS ER 20 MEQ PO TBCR
40.0000 meq | EXTENDED_RELEASE_TABLET | Freq: Every day | ORAL | Status: AC
  Administered 2020-08-07 (×2): 40 meq via ORAL
  Filled 2020-08-07: qty 4

## 2020-08-07 NOTE — Progress Notes (Signed)
ANTICOAGULATION CONSULT NOTE - Follow  Up Consult  Pharmacy Consult for heparin>apixaban Indication: chest pain/ACS and atrial fibrillation   Labs: Recent Labs    08/05/20 0115 08/05/20 0422 08/05/20 0729 08/05/20 0732 08/05/20 2205 08/06/20 0712 08/06/20 1545 08/07/20 0029  HGB 10.2*  --   --  9.8*  --  10.5*  --  9.3*  HCT 31.2*  --   --  32.1*  --  32.3*  --  29.2*  PLT 275  --   --  258  --  338  --  304  APTT 39*  --   --   --   --   --   --   --   LABPROT 13.7  --   --   --   --   --   --   --   INR 1.1  --   --   --   --   --   --   --   HEPARINUNFRC  --   --   --   --    < > 1.36* <0.10* 0.13*  CREATININE 1.40*  --  1.18*  --   --  1.97*  --  1.07*  TROPONINIHS  --  1,248* 1,029*  --   --   --   --   --    < > = values in this interval not displayed.    Assessment: 55yo female w/ recent Covid infection (initially positive 1/10, now testing negative) c/o SOB and CP; found to be in Afib with elevated troponin, to begin heparin. VQ scan negative for PE  Initially started on IV heparin > now change to oral anticoagulation > apixaban Cbc stable Cr initially bump now improved to baseline  Goal of Therapy:  Heparin level 0.3-0.7 units/ml Monitor platelets by anticoagulation protocol: Yes   Plan:  Stop iv heparin Apixaban 5mg  BID TOC consult for copay cost at DC Monitor s/s bleeding     Pharm.D. CPP, BCPS Clinical Pharmacist [phone] 08/07/2020 8:57 AM

## 2020-08-07 NOTE — Progress Notes (Signed)
ANTICOAGULATION CONSULT NOTE - Follow  Up Consult  Pharmacy Consult for heparin Indication: chest pain/ACS and atrial fibrillation   Labs: Recent Labs    08/05/20 0115 08/05/20 0422 08/05/20 0729 08/05/20 0732 08/05/20 2205 08/06/20 0712 08/06/20 1545 08/07/20 0029  HGB 10.2*  --   --  9.8*  --  10.5*  --  9.3*  HCT 31.2*  --   --  32.1*  --  32.3*  --  29.2*  PLT 275  --   --  258  --  338  --  304  APTT 39*  --   --   --   --   --   --   --   LABPROT 13.7  --   --   --   --   --   --   --   INR 1.1  --   --   --   --   --   --   --   HEPARINUNFRC  --   --   --   --    < > 1.36* <0.10* 0.13*  CREATININE 1.40*  --  1.18*  --   --  1.97*  --  1.07*  TROPONINIHS  --  1,248* 1,029*  --   --   --   --   --    < > = values in this interval not displayed.    Assessment: 55yo female w/ recent Covid infection (initially positive 1/10, now testing negative) c/o SOB and CP; found to be in Afib with elevated troponin, to begin heparin. VQ scan negative for PE  Heparin level 0.13 units/ml  Goal of Therapy:  Heparin level 0.3-0.7 units/ml Monitor platelets by anticoagulation protocol: Yes   Plan:  Increase heparin drip to 1900 uts/hr  Check heparin level in 6h  Daily HL, CBC Monitor s/s bleeding   Thanks for allowing pharmacy to be a part of this patient's care.  Talbert Cage, PharmD Clinical Pharmacist  08/07/2020, 2:05 AM

## 2020-08-07 NOTE — Progress Notes (Addendum)
PROGRESS NOTE    Sheri Park  ZSW:[id number] DOB: Jun 26, 1966 DOA: 08/05/2020 PCP: Orpah Cobb, MD  Brief Narrative: 55 year old morbidly obese female BMI of 41,, history of asthma, hypertension, iron deficiency anemia presented to the ED 1/23 AM with cough congestion fatigue shortness of breath chest pain fever and chills.  She reports having a COVID exposure over 2 weeks ago, [place], she is unvaccinated, from 1/14 started developing cough congestion fatigue body aches etc., started taking Mucinex and then developed pruritic rash, last couple of days has had left-sided chest tightness, went to urgent care recently and had another COVID PCR which was negative -Came to the emergency room 1/23 noted to be tachycardic with A. fib RVR, heart rate in the 150s, troponin of 1500, chest x-ray was unremarkable. -CTA chest was negative for PE, showed bilateral atypical infiltrates concerning for Covid pneumonia -Started on a Cardizem and heparin drip for A. fib RVR -Cardiology Dr. Algie Coffer following, clinically improving converted to sinus rhythm  Assessment & Plan:   Atrial fibrillation with RVR -Improved, converted to sinus rhythm, off Cardizem. -Continue oral metoprolol -Transition from IV heparin to Eliquis today -2D echo noted preserved EF and wall motion, moderate pulmonary hypertension -CTA chest negative for PE  NSTEMi vs Demand ischemia -Suspected to be secondary to A. fib RVR, prior left heart cath in 2018 which noted normal coronaries -EKG with ST depression in inferior leads and ST elevation in V2 -Continue metoprolol, discontinue IV heparin, appreciate cardiology input from Dr. Algie Coffer -2D echo with preserved EF and normal wall motion -Appears slightly volume overloaded today, was given over 3 L of fluid in the ED on the day of admission, will diurese with 2 doses of Lasix today  SARS COVID 19 infection, COVID  pneumonia -Tested positive on 1/10 -She is unvaccinated, initially we were concerned for PE with her symptoms, CTA was negative for pulmonary embolism but did show bilateral infiltrates concerning for Covid pneumonia -Continue IV remdesivir and Solu-Medrol- day 4, CRP and D-dimer improving -Stable off IV antibiotics -Will complete 3-week isolation on 1/31  Urticaria -Drug rash following Mucinex use, continue Claritin  Mild AKI -Resolved with hydration, monitor  Morbid obesity  -BMI is 57.8, emphasized diet and lifestyle modification  Hyperglycemia -Likely secondary to steroids, check hemoglobin A1c  DVT prophylaxis: Eliquis Code Status: Full code Family Communication: No family at bedside, updated daughter 1/23 Disposition Plan:  Status is: Inpatient  Remains inpatient appropriate because:Inpatient level of care appropriate due to severity of illness   Dispo: The patient is from: Home              Anticipated d/c is to: Home              Anticipated d/c date is: Likely 2 days              Patient currently is not medically stable to d/c.   Difficult to place patient No  Consultants:   Cardiology   Procedures:   Antimicrobials:    Subjective: -Feels better overall, still continues to have some cough and pleuritic pain with inspiration and cough  Objective: Vitals:   08/06/20 1634 08/06/20 2004 08/06/20 2345 08/07/20 0332  BP: 126/83 98/68 133/86 123/80  Pulse: 95 85 82 76  Resp: 16 18 18    Temp: 97.6 F (36.4 C) 97.8 F (36.6 C) 97.7 F (36.5 C) 97.8 F (36.6 C)  TempSrc: Oral Oral Oral Oral  SpO2:  96% 96% 100% 100%  Weight:      Height:       No intake or output data in the 24 hours ending 08/07/20 1412 Filed Weights   08/05/20 0700  Weight: (!) 152.8 kg    Examination:  General exam: Obese pleasant female sitting up in bed, AAOx3, no distress CVS: S1-S2, regular rate rhythm Lungs: Few basilar rales, otherwise distant breath sounds Abdomen:  Soft, nontender, bowel sounds present Extremities: No edema  Neuro: Moves all extremities, no localizing signs Skin: Resolving rash on her arms especially right upper extremity Psych: Appropriate mood and affect  Data Reviewed:   CBC: Recent Labs  Lab 08/01/20 0818 08/05/20 0115 08/05/20 0732 08/06/20 0712 08/07/20 0029  WBC 6.2 11.1* 9.3 8.9 13.4*  NEUTROABS  --  7.9*  --   --   --   HGB 11.6* 10.2* 9.8* 10.5* 9.3*  HCT 36.4 31.2* 32.1* 32.3* 29.2*  MCV 75.8* 75.7* 78.1* 82.8 76.4*  PLT 291 275 258 338 304   Basic Metabolic Panel: Recent Labs  Lab 08/01/20 0818 08/05/20 0115 08/05/20 0729 08/06/20 0712 08/07/20 0029  NA 140 134* 136 141 136  K 3.8 3.0* 3.3* 4.0 4.1  CL 104 101 102 109 104  CO2 25 22 23  21* 22  GLUCOSE 103* 156* 113* 102* 172*  BUN 7 11 11  50* 16  CREATININE 0.86 1.40* 1.18* 1.97* 1.07*  CALCIUM 9.0 8.1* 8.0* 8.5* 8.4*  MG  --   --  1.8  --   --    GFR: Estimated Creatinine Clearance: 90.1 mL/min (A) (by C-G formula based on SCr of 1.07 mg/dL (H)). Liver Function Tests: Recent Labs  Lab 08/05/20 0115 08/06/20 0712 08/07/20 0029  AST 27 21 20   ALT 18 29 21   ALKPHOS 47 25* 50  BILITOT 1.1 1.1 0.5  PROT 6.8 5.7* 6.5  ALBUMIN 2.8* 2.2* 2.5*   No results for input(s): LIPASE, AMYLASE in the last 168 hours. No results for input(s): AMMONIA in the last 168 hours. Coagulation Profile: Recent Labs  Lab 08/05/20 0115  INR 1.1   Cardiac Enzymes: No results for input(s): CKTOTAL, CKMB, CKMBINDEX, TROPONINI in the last 168 hours. BNP (last 3 results) No results for input(s): PROBNP in the last 8760 hours. HbA1C: No results for input(s): HGBA1C in the last 72 hours. CBG: No results for input(s): GLUCAP in the last 168 hours. Lipid Profile: No results for input(s): CHOL, HDL, LDLCALC, TRIG, CHOLHDL, LDLDIRECT in the last 72 hours. Thyroid Function Tests: Recent Labs    08/05/20 0732  TSH 1.264   Anemia Panel: Recent Labs     08/06/20 0712 08/07/20 0029  FERRITIN 260 103   Urine analysis:    Component Value Date/Time   COLORURINE AMBER (A) 08/05/2020 0420   APPEARANCEUR TURBID (A) 08/05/2020 0420   LABSPEC 1.017 08/05/2020 0420   PHURINE 5.0 08/05/2020 0420   GLUCOSEU NEGATIVE 08/05/2020 0420   HGBUR LARGE (A) 08/05/2020 0420   BILIRUBINUR NEGATIVE 08/05/2020 0420   KETONESUR NEGATIVE 08/05/2020 0420   PROTEINUR 100 (A) 08/05/2020 0420   UROBILINOGEN 0.2 12/22/2019 1136   NITRITE NEGATIVE 08/05/2020 0420   LEUKOCYTESUR NEGATIVE 08/05/2020 0420   Sepsis Labs: @LABRCNTIP (procalcitonin:4,lacticidven:4)  ) Recent Results (from the past 240 hour(s))  SARS CORONAVIRUS 2 (TAT 6-24 HRS) Nasopharyngeal Nasopharyngeal Swab     Status: None   Collection Time: 08/01/20  2:56 PM   Specimen: Nasopharyngeal Swab  Result Value Ref Range Status   SARS Coronavirus  2 NEGATIVE NEGATIVE Final    Comment: (NOTE) SARS-CoV-2 target nucleic acids are NOT DETECTED.  The SARS-CoV-2 RNA is generally detectable in upper and lower respiratory specimens during the acute phase of infection. Negative results do not preclude SARS-CoV-2 infection, do not rule out co-infections with other pathogens, and should not be used as the sole basis for treatment or other patient management decisions. Negative results must be combined with clinical observations, patient history, and epidemiological information. The expected result is Negative.  Fact Sheet for Patients: HairSlick.no  Fact Sheet for Healthcare Providers: quierodirigir.com  This test is not yet approved or cleared by the [place] FDA and  has been authorized for detection and/or diagnosis of SARS-CoV-2 by FDA under an Emergency Use Authorization (EUA). This EUA will remain  in effect (meaning this test can be used) for the duration of the COVID-19 declaration under Se ction 564(b)(1) of the Act, 21  U.S.C. section 360bbb-3(b)(1), unless the authorization is terminated or revoked sooner.  Performed at [facility], [address] [place], [place] [address]   Blood Culture (routine x 2)     Status: None (Preliminary result)   Collection Time: 08/05/20  1:15 AM   Specimen: BLOOD  Result Value Ref Range Status   Specimen Description BLOOD RIGHT ANTECUBITAL  Final   Special Requests   Final    BOTTLES DRAWN AEROBIC AND ANAEROBIC Blood Culture adequate volume   Culture   Final    NO GROWTH 1 DAY Performed at [facility], [address], [place], [place] [address]    Report Status PENDING  Incomplete  Urine culture     Status: Abnormal   Collection Time: 08/05/20  4:13 AM   Specimen: Urine, Random  Result Value Ref Range Status   Specimen Description URINE, RANDOM  Final   Special Requests   Final    NONE Performed at [facility], [address], [place], [place] [address]    Culture MULTIPLE SPECIES PRESENT, SUGGEST RECOLLECTION (A)  Final   Report Status 08/06/2020 FINAL  Final         Radiology Studies: ECHOCARDIOGRAM COMPLETE  Result Date: 08/06/2020    ECHOCARDIOGRAM REPORT   Patient Name:   Sheri Park Date of Exam: 08/06/2020 Medical Rec #:  [id number]           Height:       64.0 in Accession #:    [id number]          Weight:       336.9 lb Date of Birth:  02-11-1966          BSA:          2.442 m Patient Age:    54 years            BP:           135/93 mmHg Patient Gender: F                   HR:           82 bpm. Exam Location:  Inpatient Procedure: 2D Echo, 3D Echo, Cardiac Doppler and Color Doppler Indications:     I48.91* Unspeicified atrial fibrillation  History:         Patient has no prior history of Echocardiogram examinations.                  Abnormal ECG, Arrythmias:Atrial Fibrillation;  Signs/Symptoms:Bacteremia and Chest Pain. Covid positive.  Sonographer:     Sheralyn Boatman RDCS Referring Phys:  [id number] Orpah Cobb  Diagnosing Phys: Orpah Cobb MD  Sonographer Comments: Patient is morbidly obese. Image acquisition challenging due to patient body habitus. Algie Coffer to read. IMPRESSIONS  1. Left ventricular ejection fraction, by estimation, is 65 to 70%. The left ventricle has normal function. The left ventricle has no regional wall motion abnormalities. There is moderate concentric left ventricular hypertrophy. Left ventricular diastolic parameters are consistent with Grade I diastolic dysfunction (impaired relaxation).  2. Right ventricular systolic function is normal. The right ventricular size is normal. There is moderately elevated pulmonary artery systolic pressure.  3. Left atrial size was moderately dilated.  4. Right atrial size was mildly dilated.  5. The pericardial effusion is circumferential. There is no evidence of cardiac tamponade.  6. The mitral valve is normal in structure. Mild mitral valve regurgitation.  7. The aortic valve is tricuspid. Aortic valve regurgitation is not visualized.  8. The inferior vena cava is dilated in size with <50% respiratory variability, suggesting right atrial pressure of 15 mmHg. FINDINGS  Left Ventricle: Left ventricular ejection fraction, by estimation, is 65 to 70%. The left ventricle has normal function. The left ventricle has no regional wall motion abnormalities. The left ventricular internal cavity size was normal in size. There is  moderate concentric left ventricular hypertrophy. Left ventricular diastolic parameters are consistent with Grade I diastolic dysfunction (impaired relaxation). Right Ventricle: The right ventricular size is normal. No increase in right ventricular wall thickness. Right ventricular systolic function is normal. There is moderately elevated pulmonary artery systolic pressure. The tricuspid regurgitant velocity is 2.82 m/s, and with an assumed right atrial pressure of 15 mmHg, the estimated right ventricular systolic pressure is 46.8 mmHg. Left  Atrium: Left atrial size was moderately dilated. Right Atrium: Right atrial size was mildly dilated. Pericardium: Trivial pericardial effusion is present. The pericardial effusion is circumferential. There is no evidence of cardiac tamponade. Mitral Valve: The mitral valve is normal in structure. Mild mitral valve regurgitation. Tricuspid Valve: The tricuspid valve is normal in structure. Tricuspid valve regurgitation is mild. Aortic Valve: The aortic valve is tricuspid. Aortic valve regurgitation is not visualized. Pulmonic Valve: The pulmonic valve was normal in structure. Pulmonic valve regurgitation is not visualized. Aorta: The aortic root is normal in size and structure. There is minimal (Grade I) atheroma plaque involving the aortic root. Venous: The inferior vena cava is dilated in size with less than 50% respiratory variability, suggesting right atrial pressure of 15 mmHg. IAS/Shunts: The interatrial septum was not assessed.  LEFT VENTRICLE PLAX 2D LVIDd:         5.00 cm      Diastology LVIDs:         2.80 cm      LV e' medial:    7.83 cm/s LV PW:         1.50 cm      LV E/e' medial:  16.6 LV IVS:        1.40 cm      LV e' lateral:   10.00 cm/s LVOT diam:     2.20 cm      LV E/e' lateral: 13.0 LV SV:         124 LV SV Index:   51 LVOT Area:     3.80 cm  LV Volumes (MOD) LV vol d, MOD A2C: 116.0 ml LV vol d, MOD A4C: 73.2 ml LV vol  s, MOD A2C: 32.0 ml LV vol s, MOD A4C: 24.7 ml LV SV MOD A2C:     84.0 ml LV SV MOD A4C:     73.2 ml LV SV MOD BP:      65.3 ml RIGHT VENTRICLE RV S prime:     18.60 cm/s TAPSE (M-mode): 2.6 cm LEFT ATRIUM             Index       RIGHT ATRIUM           Index LA diam:        3.50 cm 1.43 cm/m  RA Area:     19.10 cm LA Vol (A2C):   64.8 ml 26.54 ml/m RA Volume:   59.80 ml  24.49 ml/m LA Vol (A4C):   70.0 ml 28.67 ml/m LA Biplane Vol: 71.8 ml 29.41 ml/m  AORTIC VALVE LVOT Vmax:   153.00 cm/s LVOT Vmean:  113.000 cm/s LVOT VTI:    0.327 m  AORTA Ao Root diam: 3.30 cm MITRAL  VALVE                TRICUSPID VALVE MV Area (PHT): 3.06 cm     TR Peak grad:   31.8 mmHg MV Decel Time: 248 msec     TR Vmax:        282.00 cm/s MV E velocity: 130.00 cm/s MV A velocity: 119.00 cm/s  SHUNTS MV E/A ratio:  1.09         Systemic VTI:  0.33 m                             Systemic Diam: 2.20 cm Orpah CobbAjay Kadakia MD Electronically signed by Orpah CobbAjay Kadakia MD Signature Date/Time: 08/06/2020/12:38:34 PM    Final         Scheduled Meds: . apixaban  5 mg Oral BID  . colchicine  0.6 mg Oral Daily  . diltiazem  240 mg Oral Daily  . furosemide  20 mg Intravenous BID  . loratadine  10 mg Oral Daily  . methylPREDNISolone (SOLU-MEDROL) injection  40 mg Intravenous Q12H  . metoprolol tartrate  12.5 mg Oral BID  . pantoprazole  40 mg Oral Q1200   Continuous Infusions: . remdesivir 100 mg in NS 100 mL 100 mg (08/07/20 0930)     LOS: 2 days    Time spent: 25min  Zannie CovePreetha Tanish Prien, MD [facility]  08/07/2020, 2:12 PM

## 2020-08-07 NOTE — TOC Benefit Eligibility Note (Signed)
Transition of Care [facility]) Benefit Eligibility Note    Patient Details  Name: Sheri Park MRN: [id number] Date of Birth: January 07, 1966                           Additional Notes: PRIMARY INS : MEDICAID : EFF- DATE  02-12-2020   CO-PAY-  $4.00 for each prescription    Mardene Sayer Phone Number: 08/07/2020, 4:34 PM

## 2020-08-07 NOTE — Consult Note (Signed)
Ref: Orpah Cobb, MD   Subjective:  Sinus rhythm continues. Cough and shortness of breath persist. Tolerating oral diltiazem and small dose B-blocker.  Objective:  Vital Signs in the last 24 hours: Temp:  [97.4 F (36.3 C)-97.8 F (36.6 C)] 97.8 F (36.6 C) (01/25 0332) Pulse Rate:  [76-95] 76 (01/25 0332) Cardiac Rhythm: Normal sinus rhythm (01/25 0700) Resp:  [16-18] 18 (01/24 2345) BP: (98-133)/(68-86) 123/80 (01/25 0332) SpO2:  [96 %-100 %] 100 % (01/25 0332)  Physical Exam: BP Readings from Last 1 Encounters:  08/07/20 123/80     Wt Readings from Last 1 Encounters:  08/05/20 (!) 152.8 kg    Weight change:  Body mass index is 57.82 kg/m. HEENT: [place]/AT, Eyes-Brown, Conjunctiva-Pale pink, Sclera-Non-icteric Neck: + JVD, No bruit, Trachea midline. Lungs:  Clearing, Bilateral. Cardiac:  Regular rhythm, normal S1 and S2, no S3. II/VI systolic murmur. Abdomen:  Soft, non-tender. BS present. Extremities:  1 + edema present. No cyanosis. No clubbing. CNS: AxOx3, Cranial nerves grossly intact, moves all 4 extremities.  Skin: Warm and dry.   Intake/Output from previous day: No intake/output data recorded.    Lab Results: BMET    Component Value Date/Time   NA 136 08/07/2020 0029   NA 141 08/06/2020 0712   NA 136 08/05/2020 0729   K 4.1 08/07/2020 0029   K 4.0 08/06/2020 0712   K 3.3 (L) 08/05/2020 0729   CL 104 08/07/2020 0029   CL 109 08/06/2020 0712   CL 102 08/05/2020 0729   CO2 22 08/07/2020 0029   CO2 21 (L) 08/06/2020 0712   CO2 23 08/05/2020 0729   GLUCOSE 172 (H) 08/07/2020 0029   GLUCOSE 102 (H) 08/06/2020 0712   GLUCOSE 113 (H) 08/05/2020 0729   BUN 16 08/07/2020 0029   BUN 50 (H) 08/06/2020 0712   BUN 11 08/05/2020 0729   CREATININE 1.07 (H) 08/07/2020 0029   CREATININE 1.97 (H) 08/06/2020 0712   CREATININE 1.18 (H) 08/05/2020 0729   CALCIUM 8.4 (L) 08/07/2020 0029   CALCIUM 8.5 (L) 08/06/2020 0712   CALCIUM 8.0 (L) 08/05/2020 0729    GFRNONAA >60 08/07/2020 0029   GFRNONAA 30 (L) 08/06/2020 0712   GFRNONAA 55 (L) 08/05/2020 0729   GFRAA >60 05/06/2019 1723   GFRAA >60 01/08/2018 0131   GFRAA >60 10/21/2016 0504   CBC    Component Value Date/Time   WBC 13.4 (H) 08/07/2020 0029   RBC 3.82 (L) 08/07/2020 0029   HGB 9.3 (L) 08/07/2020 0029   HCT 29.2 (L) 08/07/2020 0029   PLT 304 08/07/2020 0029   MCV 76.4 (L) 08/07/2020 0029   MCH 24.3 (L) 08/07/2020 0029   MCHC 31.8 08/07/2020 0029   RDW 17.2 (H) 08/07/2020 0029   LYMPHSABS 2.0 08/05/2020 0115   MONOABS 0.8 08/05/2020 0115   EOSABS 0.3 08/05/2020 0115   BASOSABS 0.0 08/05/2020 0115   HEPATIC Function Panel Recent Labs    08/05/20 0115 08/06/20 0712 08/07/20 0029  PROT 6.8 5.7* 6.5   HEMOGLOBIN A1C No components found for: HGA1C,  MPG CARDIAC ENZYMES Lab Results  Component Value Date   TROPONINI <0.03 10/17/2016   TROPONINI <0.03 10/17/2016   TROPONINI <0.03 10/17/2016   BNP No results for input(s): PROBNP in the last 8760 hours. TSH Recent Labs    08/05/20 0732  TSH 1.264   CHOLESTEROL No results for input(s): CHOL in the last 8760 hours.  Scheduled Meds: . apixaban  5 mg Oral BID  . colchicine  0.6 mg Oral Daily  . diltiazem  240 mg Oral Daily  . furosemide  20 mg Intravenous BID  . loratadine  10 mg Oral Daily  . methylPREDNISolone (SOLU-MEDROL) injection  40 mg Intravenous Q12H  . metoprolol tartrate  12.5 mg Oral BID  . pantoprazole  40 mg Oral Q1200  . potassium chloride  40 mEq Oral Daily   Continuous Infusions: . remdesivir 100 mg in NS 100 mL 100 mg (08/06/20 0941)   PRN Meds:.acetaminophen **OR** acetaminophen, albuterol, chlorpheniramine-HYDROcodone  Assessment/Plan: Acute myocarditis/Pericarditis Abnormal troponin I from demand ischemia Acute diastolic left heart failure Paroxysmal atrial fibrillation, CHA2DS2VASc score of 3 Acute respiratory failure with hypoxia COVID-19 pneumonia PE ruled out DVT ruled  out Morbid obesity Asthma Iron deficiency anemia  Continue oral diltiazem. Increase activity as tolerated.   LOS: 2 days   Time spent including chart review, lab review, examination, discussion with patient : 30 min   Orpah Cobb  MD  08/07/2020, 9:26 AM

## 2020-08-07 NOTE — Discharge Instructions (Signed)

## 2020-08-07 NOTE — Evaluation (Signed)
Physical Therapy Evaluation Patient Details Name: Sheri Park MRN: [id number] DOB: [birth year] Today's Date: 08/07/2020   History of Present Illness  Sheri Park is a 55 y.o. female with medical history significant of asthma, hypertension presenting to the ED via EMS for evaluation of shortness of breath, fatigue, cough, and poor oral intake for the past 5 days.  Febrile with EMS with temperature 104 F, was given 1 g Tylenol prior to arrival.  Episodes of SVT noted by EMS in route.  She was given 500 cc normal saline bolus and aspirin 324 mg. Patient states she tested positive for COVID-19 on 07/23/2020. She is not vaccinated against COVID or influenza.  She is a feeling ill for the past 1 week - having high fevers, chills, cough, and shortness of breath. About a week ago she took Mucinex and soon after started having a pruritic rash on her arms and legs. For the past 2 days she is having constant left-sided chest tightness. States recently she went to urgent care and they did another COVID test and she was told it was negative. Denies history of A. fib.  Clinical Impression  Patient received in recliner, she states she just showered, made bed. Feeling much better today. She continues to be limited by sob with activity. Performed all mobility in room with supervision. She will continue to benefit from skilled PT while here to improve functional independence, activity tolerance and strength. Recommending outpatient PT for patient to improve strength and progress exercise program for patient.     Follow Up Recommendations Outpatient PT    Equipment Recommendations  None recommended by PT    Recommendations for Other Services       Precautions / Restrictions Precautions Precautions: Fall Precaution Comments: mod fall Restrictions Weight Bearing Restrictions: No      Mobility  Bed Mobility               General bed mobility comments: not assessed, patient in recliner     Transfers Overall transfer level: Independent Equipment used: None                Ambulation/Gait Ambulation/Gait assistance: Modified independent (Device/Increase time) Gait Distance (Feet): 50 Feet Assistive device: None Gait Pattern/deviations: Step-through pattern;Decreased stride length;Decreased step length - right;Decreased step length - left;Wide base of support Gait velocity: decr   General Gait Details: patient ambulates with slow, labored cadence. Reports sob toward end of ambulation of 50 feet in room.  Stairs            Wheelchair Mobility    Modified Rankin (Stroke Patients Only)       Balance Overall balance assessment: Modified Independent                                           Pertinent Vitals/Pain Pain Assessment: No/denies pain    Home Living Family/patient expects to be discharged to:: Private residence Living Arrangements: Children Available Help at Discharge: Family;Available 24 hours/day Type of Home: House Home Access: Stairs to enter     Home Layout: Two level;Able to live on main level with bedroom/bathroom Home Equipment: None      Prior Function Level of Independence: Independent         Comments: patient lives with her son and grandchildren, was driving, independent with mobility prior to admission     Hand Dominance  Extremity/Trunk Assessment   Upper Extremity Assessment Upper Extremity Assessment: Overall WFL for tasks assessed    Lower Extremity Assessment Lower Extremity Assessment: Overall WFL for tasks assessed    Cervical / Trunk Assessment Cervical / Trunk Assessment: Normal  Communication   Communication: No difficulties  Cognition Arousal/Alertness: Awake/alert Behavior During Therapy: WFL for tasks assessed/performed Overall Cognitive Status: Within Functional Limits for tasks assessed                                        General Comments       Exercises Other Exercises Other Exercises: Instructed patient on exercises to perform to include: LAQ, marching, STS, walking program   Assessment/Plan    PT Assessment Patient needs continued PT services  PT Problem List Decreased strength;Decreased activity tolerance;Cardiopulmonary status limiting activity;Decreased mobility       PT Treatment Interventions Therapeutic activities;Therapeutic exercise;Stair training;Functional mobility training;Gait training;Patient/family education    PT Goals (Current goals can be found in the Care Plan section)  Acute Rehab PT Goals Patient Stated Goal: to return home PT Goal Formulation: With patient Time For Goal Achievement: 08/20/20 Potential to Achieve Goals: Good    Frequency Min 2X/week   Barriers to discharge        Co-evaluation               AM-PAC PT "6 Clicks" Mobility  Outcome Measure Help needed turning from your back to your side while in a flat bed without using bedrails?: A Little Help needed moving from lying on your back to sitting on the side of a flat bed without using bedrails?: A Little Help needed moving to and from a bed to a chair (including a wheelchair)?: None Help needed standing up from a chair using your arms (e.g., wheelchair or bedside chair)?: None Help needed to walk in hospital room?: None Help needed climbing 3-5 steps with a railing? : A Little 6 Click Score: 21    End of Session   Activity Tolerance: Patient limited by fatigue Patient left: in chair;with call bell/phone within reach Nurse Communication: Mobility status PT Visit Diagnosis: Muscle weakness (generalized) (M62.81);Difficulty in walking, not elsewhere classified (R26.2)    Time: 0973-5329 PT Time Calculation (min) (ACUTE ONLY): 24 min   Charges:   PT Evaluation $PT Eval Moderate Complexity: 1 Mod PT Treatments $Therapeutic Exercise: 8-22 mins        Sheri Park, PT, GCS 08/07/20,2:53 PM

## 2020-08-08 DIAGNOSIS — I4891 Unspecified atrial fibrillation: Secondary | ICD-10-CM | POA: Diagnosis not present

## 2020-08-08 DIAGNOSIS — A419 Sepsis, unspecified organism: Secondary | ICD-10-CM | POA: Diagnosis not present

## 2020-08-08 DIAGNOSIS — I214 Non-ST elevation (NSTEMI) myocardial infarction: Secondary | ICD-10-CM | POA: Diagnosis not present

## 2020-08-08 DIAGNOSIS — N179 Acute kidney failure, unspecified: Secondary | ICD-10-CM | POA: Diagnosis not present

## 2020-08-08 LAB — CBC
HCT: 30.4 % — ABNORMAL LOW (ref 36.0–46.0)
Hemoglobin: 9.6 g/dL — ABNORMAL LOW (ref 12.0–15.0)
MCH: 24.2 pg — ABNORMAL LOW (ref 26.0–34.0)
MCHC: 31.6 g/dL (ref 30.0–36.0)
MCV: 76.6 fL — ABNORMAL LOW (ref 80.0–100.0)
Platelets: 316 10*3/uL (ref 150–400)
RBC: 3.97 MIL/uL (ref 3.87–5.11)
RDW: 17.2 % — ABNORMAL HIGH (ref 11.5–15.5)
WBC: 13.4 10*3/uL — ABNORMAL HIGH (ref 4.0–10.5)
nRBC: 0.2 % (ref 0.0–0.2)

## 2020-08-08 LAB — COMPREHENSIVE METABOLIC PANEL
ALT: 24 U/L (ref 0–44)
AST: 20 U/L (ref 15–41)
Albumin: 2.6 g/dL — ABNORMAL LOW (ref 3.5–5.0)
Alkaline Phosphatase: 56 U/L (ref 38–126)
Anion gap: 9 (ref 5–15)
BUN: 26 mg/dL — ABNORMAL HIGH (ref 6–20)
CO2: 22 mmol/L (ref 22–32)
Calcium: 8.5 mg/dL — ABNORMAL LOW (ref 8.9–10.3)
Chloride: 104 mmol/L (ref 98–111)
Creatinine, Ser: 1.25 mg/dL — ABNORMAL HIGH (ref 0.44–1.00)
GFR, Estimated: 52 mL/min — ABNORMAL LOW (ref >60)
Glucose, Bld: 258 mg/dL — ABNORMAL HIGH (ref 70–99)
Potassium: 4.2 mmol/L (ref 3.5–5.1)
Sodium: 135 mmol/L (ref 135–145)
Total Bilirubin: 0.7 mg/dL (ref 0.3–1.2)
Total Protein: 6.6 g/dL (ref 6.5–8.1)

## 2020-08-08 LAB — FERRITIN: Ferritin: 87 ng/mL (ref 11–307)

## 2020-08-08 LAB — GLUCOSE, CAPILLARY
Glucose-Capillary: 165 mg/dL — ABNORMAL HIGH (ref 70–99)
Glucose-Capillary: 149 mg/dL — ABNORMAL HIGH (ref 70–99)
Glucose-Capillary: 132 mg/dL — ABNORMAL HIGH (ref 70–99)

## 2020-08-08 LAB — HEMOGLOBIN A1C
Hgb A1c MFr Bld: 6.6 % — ABNORMAL HIGH (ref 4.8–5.6)
Mean Plasma Glucose: 142.72 mg/dL

## 2020-08-08 LAB — C-REACTIVE PROTEIN: CRP: 8.8 mg/dL — ABNORMAL HIGH (ref <1.0)

## 2020-08-08 LAB — D-DIMER, QUANTITATIVE: D-Dimer, Quant: 3.43 ug/mL-FEU — ABNORMAL HIGH (ref 0.00–0.50)

## 2020-08-08 MED ORDER — PREDNISONE 20 MG PO TABS: 50.0000 mg | ORAL_TABLET | Freq: Every day | ORAL | Status: DC

## 2020-08-08 MED ORDER — INSULIN ASPART 100 UNIT/ML ~~LOC~~ SOLN
6.0000 [IU] | Freq: Three times a day (TID) | SUBCUTANEOUS | Status: DC
  Administered 2020-08-08 – 2020-08-10 (×7): 6 [IU] via SUBCUTANEOUS

## 2020-08-08 MED ORDER — PREDNISONE 5 MG (21) PO TBPK
10.0000 mg | ORAL_TABLET | ORAL | Status: DC
Start: 2020-08-08 — End: 2020-08-08

## 2020-08-08 MED ORDER — ALBUMIN HUMAN 25 % IV SOLN
12.5000 g | Freq: Once | INTRAVENOUS | Status: AC
  Administered 2020-08-08: 12.5 g via INTRAVENOUS
  Filled 2020-08-08: qty 50

## 2020-08-08 MED ORDER — INSULIN GLARGINE 100 UNIT/ML ~~LOC~~ SOLN
10.0000 [IU] | Freq: Every day | SUBCUTANEOUS | Status: DC
  Administered 2020-08-08 – 2020-08-09 (×2): 10 [IU] via SUBCUTANEOUS
  Filled 2020-08-08 (×3): qty 0.1

## 2020-08-08 MED ORDER — INFLUENZA VAC SPLIT QUAD 0.5 ML IM SUSY
0.5000 mL | PREFILLED_SYRINGE | INTRAMUSCULAR | Status: AC
  Administered 2020-08-10: 0.5 mL via INTRAMUSCULAR
  Filled 2020-08-08: qty 0.5

## 2020-08-08 MED ORDER — INSULIN ASPART 100 UNIT/ML ~~LOC~~ SOLN
0.0000 [IU] | Freq: Three times a day (TID) | SUBCUTANEOUS | Status: DC
  Administered 2020-08-08: 3 [IU] via SUBCUTANEOUS
  Administered 2020-08-08: 4 [IU] via SUBCUTANEOUS
  Administered 2020-08-09: 3 [IU] via SUBCUTANEOUS

## 2020-08-08 MED ORDER — BUTALBITAL-APAP-CAFFEINE 50-325-40 MG PO TABS
2.0000 | ORAL_TABLET | Freq: Four times a day (QID) | ORAL | Status: DC | PRN
  Administered 2020-08-08: 2 via ORAL
  Filled 2020-08-08: qty 2

## 2020-08-08 NOTE — Consult Note (Signed)
Ref: Orpah Cobb, MD   Subjective:  Feeling better. Cough persist. Sitting up. VS stable. Monitor shows sinus rhythm.  Some diuresis with lasix use. CRP and D-dimer are trending down with solumedrol use. She will fiish 4 days of Remdesivir treatment tomorrow. She is planning to increase activity today with PT, dietary consult and possible discharge home by Friday.  Objective:  Vital Signs in the last 24 hours: Temp:  [97.4 F (36.3 C)-98 F (36.7 C)] 97.6 F (36.4 C) (01/26 0815) Pulse Rate:  [71-101] 89 (01/26 0815) Cardiac Rhythm: Normal sinus rhythm (01/25 2010) Resp:  [17-18] 17 (01/26 0815) BP: (136-152)/(89-110) 142/110 (01/26 0815) SpO2:  [100 %] 100 % (01/26 0815)  Physical Exam: BP Readings from Last 1 Encounters:  08/08/20 (!) 142/110     Wt Readings from Last 1 Encounters:  08/05/20 (!) 152.8 kg    Weight change:  Body mass index is 57.82 kg/m. HEENT: [place]/AT, Eyes-Brown, Conjunctiva-[place], Sclera-Non-icteric Neck: No JVD, No bruit, Trachea midline. Lungs:  Clear, Bilateral. Cardiac:  Regular rhythm, normal S1 and S2, no S3. II/VI systolic murmur. Abdomen:  Soft, non-tender. BS present. Extremities:  1 + edema present. No cyanosis. No clubbing. CNS: AxOx3, Cranial nerves grossly intact, moves all 4 extremities.  Skin: Warm and dry.   Intake/Output from previous day: 01/25 0701 - 01/26 0700 In: 337.2 [P.O.:100; IV Piggyback:237.2] Out: -     Lab Results: BMET    Component Value Date/Time   NA 135 08/08/2020 0126   NA 136 08/07/2020 0029   NA 141 08/06/2020 0712   K 4.2 08/08/2020 0126   K 4.1 08/07/2020 0029   K 4.0 08/06/2020 0712   CL 104 08/08/2020 0126   CL 104 08/07/2020 0029   CL 109 08/06/2020 0712   CO2 22 08/08/2020 0126   CO2 22 08/07/2020 0029   CO2 21 (L) 08/06/2020 0712   GLUCOSE 258 (H) 08/08/2020 0126   GLUCOSE 172 (H) 08/07/2020 0029   GLUCOSE 102 (H) 08/06/2020 0712   BUN 26 (H) 08/08/2020 0126   BUN 16 08/07/2020 0029    BUN 50 (H) 08/06/2020 0712   CREATININE 1.25 (H) 08/08/2020 0126   CREATININE 1.07 (H) 08/07/2020 0029   CREATININE 1.97 (H) 08/06/2020 0712   CALCIUM 8.5 (L) 08/08/2020 0126   CALCIUM 8.4 (L) 08/07/2020 0029   CALCIUM 8.5 (L) 08/06/2020 0712   GFRNONAA 52 (L) 08/08/2020 0126   GFRNONAA >60 08/07/2020 0029   GFRNONAA 30 (L) 08/06/2020 0712   GFRAA >60 05/06/2019 1723   GFRAA >60 01/08/2018 0131   GFRAA >60 10/21/2016 0504   CBC    Component Value Date/Time   WBC 13.4 (H) 08/08/2020 0126   RBC 3.97 08/08/2020 0126   HGB 9.6 (L) 08/08/2020 0126   HCT 30.4 (L) 08/08/2020 0126   PLT 316 08/08/2020 0126   MCV 76.6 (L) 08/08/2020 0126   MCH 24.2 (L) 08/08/2020 0126   MCHC 31.6 08/08/2020 0126   RDW 17.2 (H) 08/08/2020 0126   LYMPHSABS 2.0 08/05/2020 0115   MONOABS 0.8 08/05/2020 0115   EOSABS 0.3 08/05/2020 0115   BASOSABS 0.0 08/05/2020 0115   HEPATIC Function Panel Recent Labs    08/06/20 0712 08/07/20 0029 08/08/20 0126  PROT 5.7* 6.5 6.6   HEMOGLOBIN A1C No components found for: HGA1C,  MPG CARDIAC ENZYMES Lab Results  Component Value Date   TROPONINI <0.03 10/17/2016   TROPONINI <0.03 10/17/2016   TROPONINI <0.03 10/17/2016   BNP No results for input(s):  PROBNP in the last 8760 hours. TSH Recent Labs    08/05/20 0732  TSH 1.264   CHOLESTEROL No results for input(s): CHOL in the last 8760 hours.  Scheduled Meds: . apixaban  5 mg Oral BID  . colchicine  0.6 mg Oral Daily  . diltiazem  240 mg Oral Daily  . [START ON 08/09/2020] influenza vac split quadrivalent PF  0.5 mL Intramuscular Tomorrow-1000  . loratadine  10 mg Oral Daily  . methylPREDNISolone (SOLU-MEDROL) injection  40 mg Intravenous Q12H  . metoprolol tartrate  12.5 mg Oral BID  . pantoprazole  40 mg Oral Q1200   Continuous Infusions: . albumin human    . remdesivir 100 mg in NS 100 mL Stopped (08/07/20 1048)   PRN Meds:.acetaminophen **OR** acetaminophen, albuterol,  chlorpheniramine-HYDROcodone  Assessment/Plan: Acute myocarditis/Pericarditis Abnormal troponin I from demand ischemia Acute diastolic left heart failure Paroxysmal atrial fibrillation COVID-19 pneumonia PE rul;ed out DVT ruled out Morbid obesity Asthma Iron deficiency anemia Hypoalbuminemia Hyperglycemia, steroid induced  Increase activity. Consider torsemide 20 mg. M-W-F post albumin infusion. Insulin with sliding scale   LOS: 3 days   Time spent including chart review, lab review, examination, discussion with patient/Nurse/Physician : 30 min   Orpah Cobb  MD  08/08/2020, 8:29 AM

## 2020-08-08 NOTE — Progress Notes (Signed)
[facility] PROGRESS NOTE    Progress Note  Sheri Park  AVW:[id number] DOB: Jan 02, 1966 DOA: 08/05/2020 PCP: Orpah CobbKadakia, Ajay, MD     Brief Narrative:   Sheri Park is an 55 y.o. female morbidly obese, history of asthma iron deficiency anemia, unvaccinated presents to the ED on 08/05/2018 with cough, fatigue shortness of breath chest pain fever and chills.  On 07/27/2020 she had a Covid test which was negative, she continued to have productive cough and a.  Rash.  Went to urgent care and tested positive for COVID-19 on 07/23/2020.  Came into the ED on 08/05/2020 with A. fib troponins of 1500 chest x-ray unremarkable. CT scan of the chest ruled out PE showed bilateral atypical infiltrates. Cardiology was consulted she was started on IV Cardizem.  Now in sinus rhythm  Assessment/Plan:   A. fib with RVR: Likely is Started on IV diltiazem now sinus rhythm. Cardiology on board she was started initially on IV heparin now transition to Eliquis. Continue metoprolol. 2D echo showed no acute abnormalities.  Elevated troponins: Suspect likely due to RVR prior cath in 2018 showed normal coronaries. 2D echo showed preserved EF cardiology was consulted recommended no further ischemic work-up continue metoprolol. She was given 2 doses of IV Lasix yesterday as she appeared fluid overloaded. I's and O's are poorly recorded, her creatinine has increased. Stop diuresis.  SARS-CoV-2 infection/Covid pneumonia: She tested + 07/23/2020 she is unvaccinated initially there was a concern for PE but CTA was negative for PE and showed bilateral infiltrates. Procalcitonin low yield. She has been weaned to room air very quickly, on admission she was on oxygen she was satting 100%. Will wean off prednisone continue her 5-day course of remdesivir. Isolation will be completed on 08/14/2019.  Urticarial rash: Continue Claritin.  Mild acute kidney injury: No results.  Morbid  obesity: Counseled.  Hyperglycemia: Likely due to steroids.  With a hemoglobin A1c of 6.6.   DVT prophylaxis: Eliquis Family Communication: None Status is: Inpatient  Remains inpatient appropriate because:Hemodynamically unstable   Dispo: The patient is from: Home              Anticipated d/c is to: Home              Anticipated d/c date is: 2 days              Patient currently is not medically stable to d/c.   Difficult to place patient No        Code Status:     Code Status Orders  (From admission, onward)         Start     Ordered   08/05/20 0701  Full code  Continuous        08/05/20 0701        Code Status History    Date Active Date Inactive Code Status Order ID Comments User Context   10/17/2016 1123 10/21/2016 1745 Full Code [id number]  Orpah CobbKadakia, Ajay, MD Inpatient   Advance Care Planning Activity        IV Access:    Peripheral IV   Procedures and diagnostic studies:   ECHOCARDIOGRAM COMPLETE  Result Date: 08/06/2020    ECHOCARDIOGRAM REPORT   Patient Name:   Sheri Park Date of Exam: 08/06/2020 Medical Rec #:  [id number]           Height:       64.0 in Accession #:    [id number]  Weight:       336.9 lb Date of Birth:  21-Sep-1965          BSA:          2.442 m Patient Age:    54 years            BP:           135/93 mmHg Patient Gender: F                   HR:           82 bpm. Exam Location:  Inpatient Procedure: 2D Echo, 3D Echo, Cardiac Doppler and Color Doppler Indications:     I48.91* Unspeicified atrial fibrillation  History:         Patient has no prior history of Echocardiogram examinations.                  Abnormal ECG, Arrythmias:Atrial Fibrillation;                  Signs/Symptoms:Bacteremia and Chest Pain. Covid positive.  Sonographer:     Sheralyn Boatman RDCS Referring Phys:  [id number] Orpah Cobb Diagnosing Phys: Orpah Cobb MD  Sonographer Comments: Patient is morbidly obese. Image acquisition challenging due to patient body  habitus. Algie Coffer to read. IMPRESSIONS  1. Left ventricular ejection fraction, by estimation, is 65 to 70%. The left ventricle has normal function. The left ventricle has no regional wall motion abnormalities. There is moderate concentric left ventricular hypertrophy. Left ventricular diastolic parameters are consistent with Grade I diastolic dysfunction (impaired relaxation).  2. Right ventricular systolic function is normal. The right ventricular size is normal. There is moderately elevated pulmonary artery systolic pressure.  3. Left atrial size was moderately dilated.  4. Right atrial size was mildly dilated.  5. The pericardial effusion is circumferential. There is no evidence of cardiac tamponade.  6. The mitral valve is normal in structure. Mild mitral valve regurgitation.  7. The aortic valve is tricuspid. Aortic valve regurgitation is not visualized.  8. The inferior vena cava is dilated in size with <50% respiratory variability, suggesting right atrial pressure of 15 mmHg. FINDINGS  Left Ventricle: Left ventricular ejection fraction, by estimation, is 65 to 70%. The left ventricle has normal function. The left ventricle has no regional wall motion abnormalities. The left ventricular internal cavity size was normal in size. There is  moderate concentric left ventricular hypertrophy. Left ventricular diastolic parameters are consistent with Grade I diastolic dysfunction (impaired relaxation). Right Ventricle: The right ventricular size is normal. No increase in right ventricular wall thickness. Right ventricular systolic function is normal. There is moderately elevated pulmonary artery systolic pressure. The tricuspid regurgitant velocity is 2.82 m/s, and with an assumed right atrial pressure of 15 mmHg, the estimated right ventricular systolic pressure is 46.8 mmHg. Left Atrium: Left atrial size was moderately dilated. Right Atrium: Right atrial size was mildly dilated. Pericardium: Trivial pericardial  effusion is present. The pericardial effusion is circumferential. There is no evidence of cardiac tamponade. Mitral Valve: The mitral valve is normal in structure. Mild mitral valve regurgitation. Tricuspid Valve: The tricuspid valve is normal in structure. Tricuspid valve regurgitation is mild. Aortic Valve: The aortic valve is tricuspid. Aortic valve regurgitation is not visualized. Pulmonic Valve: The pulmonic valve was normal in structure. Pulmonic valve regurgitation is not visualized. Aorta: The aortic root is normal in size and structure. There is minimal (Grade I) atheroma plaque involving the aortic root. Venous: The inferior  vena cava is dilated in size with less than 50% respiratory variability, suggesting right atrial pressure of 15 mmHg. IAS/Shunts: The interatrial septum was not assessed.  LEFT VENTRICLE PLAX 2D LVIDd:         5.00 cm      Diastology LVIDs:         2.80 cm      LV e' medial:    7.83 cm/s LV PW:         1.50 cm      LV E/e' medial:  16.6 LV IVS:        1.40 cm      LV e' lateral:   10.00 cm/s LVOT diam:     2.20 cm      LV E/e' lateral: 13.0 LV SV:         124 LV SV Index:   51 LVOT Area:     3.80 cm  LV Volumes (MOD) LV vol d, MOD A2C: 116.0 ml LV vol d, MOD A4C: 73.2 ml LV vol s, MOD A2C: 32.0 ml LV vol s, MOD A4C: 24.7 ml LV SV MOD A2C:     84.0 ml LV SV MOD A4C:     73.2 ml LV SV MOD BP:      65.3 ml RIGHT VENTRICLE RV S prime:     18.60 cm/s TAPSE (M-mode): 2.6 cm LEFT ATRIUM             Index       RIGHT ATRIUM           Index LA diam:        3.50 cm 1.43 cm/m  RA Area:     19.10 cm LA Vol (A2C):   64.8 ml 26.54 ml/m RA Volume:   59.80 ml  24.49 ml/m LA Vol (A4C):   70.0 ml 28.67 ml/m LA Biplane Vol: 71.8 ml 29.41 ml/m  AORTIC VALVE LVOT Vmax:   153.00 cm/s LVOT Vmean:  113.000 cm/s LVOT VTI:    0.327 m  AORTA Ao Root diam: 3.30 cm MITRAL VALVE                TRICUSPID VALVE MV Area (PHT): 3.06 cm     TR Peak grad:   31.8 mmHg MV Decel Time: 248 msec     TR Vmax:         282.00 cm/s MV E velocity: 130.00 cm/s MV A velocity: 119.00 cm/s  SHUNTS MV E/A ratio:  1.09         Systemic VTI:  0.33 m                             Systemic Diam: 2.20 cm Orpah Cobb MD Electronically signed by Orpah Cobb MD Signature Date/Time: 08/06/2020/12:38:34 PM    Final      Medical Consultants:    None.  Anti-Infectives:   IV remdesivir.  Subjective:    Sheri Croft she relates she feels better still winded when she ambulates.  Objective:    Vitals:   08/08/20 0341 08/08/20 0354 08/08/20 0355 08/08/20 0815  BP: (!) 136/91  (!) 136/91 (!) 142/110  Pulse:  71 71 89  Resp:   18 17  Temp:   (!) 97.4 F (36.3 C) 97.6 F (36.4 C)  TempSrc:   Oral Oral  SpO2:  100% 100% 100%  Weight:      Height:  SpO2: 100 % O2 Flow Rate (L/min): 4 L/min   Intake/Output Summary (Last 24 hours) at 08/08/2020 1021 Last data filed at 08/08/2020 0233 Gross per 24 hour  Intake 337.17 ml  Output --  Net 337.17 ml   Filed Weights   08/05/20 0700  Weight: (!) 152.8 kg    Exam: General exam: In no acute distress. Respiratory system: Good air movement and crackles at bases bilaterally. Cardiovascular system: S1 & S2 heard, RRR. No JVD. Gastrointestinal system: Abdomen is nondistended, soft and nontender.  Extremities: No pedal edema. Skin: No rashes, lesions or ulcers Psychiatry: Judgement and insight appear normal. Mood & affect appropriate.    Data Reviewed:    Labs: Basic Metabolic Panel: Recent Labs  Lab 08/05/20 0115 08/05/20 0729 08/06/20 0712 08/07/20 0029 08/08/20 0126  NA 134* 136 141 136 135  K 3.0* 3.3* 4.0 4.1 4.2  CL 101 102 109 104 104  CO2 22 23 21* 22 22  GLUCOSE 156* 113* 102* 172* 258*  BUN 11 11 50* 16 26*  CREATININE 1.40* 1.18* 1.97* 1.07* 1.25*  CALCIUM 8.1* 8.0* 8.5* 8.4* 8.5*  MG  --  1.8  --   --   --    GFR Estimated Creatinine Clearance: 77.2 mL/min (A) (by C-G formula based on SCr of 1.25 mg/dL (H)). Liver Function  Tests: Recent Labs  Lab 08/05/20 0115 08/06/20 0712 08/07/20 0029 08/08/20 0126  AST 27 21 20 20   ALT 18 29 21 24   ALKPHOS 47 25* 50 56  BILITOT 1.1 1.1 0.5 0.7  PROT 6.8 5.7* 6.5 6.6  ALBUMIN 2.8* 2.2* 2.5* 2.6*   No results for input(s): LIPASE, AMYLASE in the last 168 hours. No results for input(s): AMMONIA in the last 168 hours. Coagulation profile Recent Labs  Lab 08/05/20 0115  INR 1.1   COVID-19 Labs  Recent Labs    08/06/20 0712 08/07/20 0029 08/08/20 0126  DDIMER 9.29* 5.81* 3.43*  FERRITIN 260 103 87  CRP <0.5 17.8* 8.8*    Lab Results  Component Value Date   SARSCOV2NAA NEGATIVE 08/01/2020    CBC: Recent Labs  Lab 08/05/20 0115 08/05/20 0732 08/06/20 0712 08/07/20 0029 08/08/20 0126  WBC 11.1* 9.3 8.9 13.4* 13.4*  NEUTROABS 7.9*  --   --   --   --   HGB 10.2* 9.8* 10.5* 9.3* 9.6*  HCT 31.2* 32.1* 32.3* 29.2* 30.4*  MCV 75.7* 78.1* 82.8 76.4* 76.6*  PLT 275 258 338 304 316   Cardiac Enzymes: No results for input(s): CKTOTAL, CKMB, CKMBINDEX, TROPONINI in the last 168 hours. BNP (last 3 results) No results for input(s): PROBNP in the last 8760 hours. CBG: No results for input(s): GLUCAP in the last 168 hours. D-Dimer: Recent Labs    08/07/20 0029 08/08/20 0126  DDIMER 5.81* 3.43*   Hgb A1c: Recent Labs    08/08/20 0909  HGBA1C 6.6*   Lipid Profile: No results for input(s): CHOL, HDL, LDLCALC, TRIG, CHOLHDL, LDLDIRECT in the last 72 hours. Thyroid function studies: No results for input(s): TSH, T4TOTAL, T3FREE, THYROIDAB in the last 72 hours.  Invalid input(s): FREET3 Anemia work up: Recent Labs    08/07/20 0029 08/08/20 0126  FERRITIN 103 87   Sepsis Labs: Recent Labs  Lab 08/05/20 0123 08/05/20 0424 08/05/20 0729 08/05/20 0732 08/06/20 0712 08/07/20 0029 08/08/20 0126  PROCALCITON  --   --  0.24  --   --   --   --   WBC  --   --   --  9.3 8.9 13.4* 13.4*  LATICACIDVEN 1.8 1.0  --   --   --   --   --     Microbiology Recent Results (from the past 240 hour(s))  SARS CORONAVIRUS 2 (TAT 6-24 HRS) Nasopharyngeal Nasopharyngeal Swab     Status: None   Collection Time: 08/01/20  2:56 PM   Specimen: Nasopharyngeal Swab  Result Value Ref Range Status   SARS Coronavirus 2 NEGATIVE NEGATIVE Final    Comment: (NOTE) SARS-CoV-2 target nucleic acids are NOT DETECTED.  The SARS-CoV-2 RNA is generally detectable in upper and lower respiratory specimens during the acute phase of infection. Negative results do not preclude SARS-CoV-2 infection, do not rule out co-infections with other pathogens, and should not be used as the sole basis for treatment or other patient management decisions. Negative results must be combined with clinical observations, patient history, and epidemiological information. The expected result is Negative.  Fact Sheet for Patients: HairSlick.no  Fact Sheet for Healthcare Providers: quierodirigir.com  This test is not yet approved or cleared by the [place] FDA and  has been authorized for detection and/or diagnosis of SARS-CoV-2 by FDA under an Emergency Use Authorization (EUA). This EUA will remain  in effect (meaning this test can be used) for the duration of the COVID-19 declaration under Se ction 564(b)(1) of the Act, 21 U.S.C. section 360bbb-3(b)(1), unless the authorization is terminated or revoked sooner.  Performed at [facility], [address] [place], [place] [address]   Blood Culture (routine x 2)     Status: None (Preliminary result)   Collection Time: 08/05/20  1:15 AM   Specimen: BLOOD  Result Value Ref Range Status   Specimen Description BLOOD RIGHT ANTECUBITAL  Final   Special Requests   Final    BOTTLES DRAWN AEROBIC AND ANAEROBIC Blood Culture adequate volume   Culture   Final    NO GROWTH 2 DAYS Performed at [facility], [address] [place], [place] [address]     Report Status PENDING  Incomplete  Urine culture     Status: Abnormal   Collection Time: 08/05/20  4:13 AM   Specimen: Urine, Random  Result Value Ref Range Status   Specimen Description URINE, RANDOM  Final   Special Requests   Final    NONE Performed at [facility], [address] [place], [place] [address]    Culture MULTIPLE SPECIES PRESENT, SUGGEST RECOLLECTION (A)  Final   Report Status 08/06/2020 FINAL  Final  Culture, blood (Routine X 2) w Reflex to ID Panel     Status: None (Preliminary result)   Collection Time: 08/06/20 10:05 AM   Specimen: BLOOD RIGHT ARM  Result Value Ref Range Status   Specimen Description BLOOD RIGHT ARM  Final   Special Requests   Final    BOTTLES DRAWN AEROBIC AND ANAEROBIC Blood Culture adequate volume   Culture   Final    NO GROWTH 1 DAY Performed at [facility], [address] [place], [place] [address]    Report Status PENDING  Incomplete     Medications:   . apixaban  5 mg Oral BID  . colchicine  0.6 mg Oral Daily  . diltiazem  240 mg Oral Daily  . [START ON 08/09/2020] influenza vac split quadrivalent PF  0.5 mL Intramuscular Tomorrow-1000  . insulin aspart  0-20 Units Subcutaneous TID WC  . insulin aspart  6 Units Subcutaneous TID WC  . insulin  glargine  10 Units Subcutaneous QHS  . loratadine  10 mg Oral Daily  . methylPREDNISolone (SOLU-MEDROL) injection  40 mg Intravenous Q12H  . metoprolol tartrate  12.5 mg Oral BID  . pantoprazole  40 mg Oral Q1200   Continuous Infusions: . albumin human    . remdesivir 100 mg in NS 100 mL Stopped (08/07/20 1048)      LOS: 3 days   Marinda Elk  [facility]  08/08/2020, 10:21 AM

## 2020-08-09 DIAGNOSIS — I214 Non-ST elevation (NSTEMI) myocardial infarction: Secondary | ICD-10-CM | POA: Diagnosis not present

## 2020-08-09 DIAGNOSIS — N179 Acute kidney failure, unspecified: Secondary | ICD-10-CM | POA: Diagnosis not present

## 2020-08-09 DIAGNOSIS — I4891 Unspecified atrial fibrillation: Secondary | ICD-10-CM | POA: Diagnosis not present

## 2020-08-09 DIAGNOSIS — A419 Sepsis, unspecified organism: Secondary | ICD-10-CM | POA: Diagnosis not present

## 2020-08-09 LAB — GLUCOSE, CAPILLARY
Glucose-Capillary: 106 mg/dL — ABNORMAL HIGH (ref 70–99)
Glucose-Capillary: 136 mg/dL — ABNORMAL HIGH (ref 70–99)
Glucose-Capillary: 114 mg/dL — ABNORMAL HIGH (ref 70–99)
Glucose-Capillary: 120 mg/dL — ABNORMAL HIGH (ref 70–99)

## 2020-08-09 LAB — BASIC METABOLIC PANEL
Anion gap: 10 (ref 5–15)
BUN: 23 mg/dL — ABNORMAL HIGH (ref 6–20)
CO2: 25 mmol/L (ref 22–32)
Calcium: 8.7 mg/dL — ABNORMAL LOW (ref 8.9–10.3)
Chloride: 105 mmol/L (ref 98–111)
Creatinine, Ser: 1.09 mg/dL — ABNORMAL HIGH (ref 0.44–1.00)
GFR, Estimated: >60 mL/min (ref >60)
Glucose, Bld: 106 mg/dL — ABNORMAL HIGH (ref 70–99)
Potassium: 3.9 mmol/L (ref 3.5–5.1)
Sodium: 140 mmol/L (ref 135–145)

## 2020-08-09 LAB — CBC
HCT: 31 % — ABNORMAL LOW (ref 36.0–46.0)
Hemoglobin: 9.7 g/dL — ABNORMAL LOW (ref 12.0–15.0)
MCH: 24 pg — ABNORMAL LOW (ref 26.0–34.0)
MCHC: 31.3 g/dL (ref 30.0–36.0)
MCV: 76.5 fL — ABNORMAL LOW (ref 80.0–100.0)
Platelets: 362 10*3/uL (ref 150–400)
RBC: 4.05 MIL/uL (ref 3.87–5.11)
RDW: 17.1 % — ABNORMAL HIGH (ref 11.5–15.5)
WBC: 15.4 10*3/uL — ABNORMAL HIGH (ref 4.0–10.5)
nRBC: 0.5 % — ABNORMAL HIGH (ref 0.0–0.2)

## 2020-08-09 MED ORDER — POTASSIUM CHLORIDE CRYS ER 20 MEQ PO TBCR
40.0000 meq | EXTENDED_RELEASE_TABLET | Freq: Two times a day (BID) | ORAL | Status: AC
  Administered 2020-08-09 (×2): 40 meq via ORAL
  Filled 2020-08-09 (×2): qty 2

## 2020-08-09 MED ORDER — FUROSEMIDE 10 MG/ML IJ SOLN
40.0000 mg | Freq: Once | INTRAMUSCULAR | Status: AC
  Administered 2020-08-09: 40 mg via INTRAVENOUS
  Filled 2020-08-09: qty 4

## 2020-08-09 MED ORDER — HYDROCORTISONE 1 % EX CREA
TOPICAL_CREAM | Freq: Two times a day (BID) | CUTANEOUS | Status: DC
  Filled 2020-08-09: qty 28

## 2020-08-09 MED ORDER — PREDNISONE 20 MG PO TABS
30.0000 mg | ORAL_TABLET | Freq: Every day | ORAL | Status: DC
  Administered 2020-08-09 – 2020-08-10 (×2): 30 mg via ORAL
  Filled 2020-08-09 (×2): qty 1

## 2020-08-09 MED ORDER — ALBUMIN HUMAN 25 % IV SOLN
25.0000 g | Freq: Four times a day (QID) | INTRAVENOUS | Status: AC
  Administered 2020-08-09 – 2020-08-10 (×4): 25 g via INTRAVENOUS
  Filled 2020-08-09 (×5): qty 100

## 2020-08-09 NOTE — Consult Note (Signed)
Ref: Orpah Cobb, MD   Subjective:  Chest pain with cough. VS stable. Hypertensive.  Increasing activity.  Objective:  Vital Signs in the last 24 hours: Temp:  [97.4 F (36.3 C)-98 F (36.7 C)] 97.4 F (36.3 C) (01/27 0843) Pulse Rate:  [70-80] 76 (01/27 0843) Resp:  [18-20] 18 (01/27 0843) BP: (123-171)/(72-112) 155/109 (01/27 0843) SpO2:  [97 %-100 %] 97 % (01/27 0843)  Physical Exam: BP Readings from Last 1 Encounters:  08/09/20 (!) 155/109     Wt Readings from Last 1 Encounters:  08/05/20 (!) 152.8 kg    Weight change:  Body mass index is 57.82 kg/m. HEENT: [place]/AT, Eyes-Brown, Conjunctiva-Pale pink, Sclera-Non-icteric Neck: No JVD, No bruit, Trachea midline. Lungs:  Clearing, Bilateral. Cardiac:  Regular rhythm, normal S1 and S2, no S3. II/VI systolic murmur. Abdomen:  Soft, non-tender. BS present. Extremities:  1 + edema present. No cyanosis. No clubbing. CNS: AxOx3, Cranial nerves grossly intact, moves all 4 extremities.  Skin: Warm and dry.   Intake/Output from previous day: 01/26 0701 - 01/27 0700 In: 763.6 [P.O.:250; I.V.:413.6; IV Piggyback:100] Out: -     Lab Results: BMET    Component Value Date/Time   NA 135 08/08/2020 0126   NA 136 08/07/2020 0029   NA 141 08/06/2020 0712   K 4.2 08/08/2020 0126   K 4.1 08/07/2020 0029   K 4.0 08/06/2020 0712   CL 104 08/08/2020 0126   CL 104 08/07/2020 0029   CL 109 08/06/2020 0712   CO2 22 08/08/2020 0126   CO2 22 08/07/2020 0029   CO2 21 (L) 08/06/2020 0712   GLUCOSE 258 (H) 08/08/2020 0126   GLUCOSE 172 (H) 08/07/2020 0029   GLUCOSE 102 (H) 08/06/2020 0712   BUN 26 (H) 08/08/2020 0126   BUN 16 08/07/2020 0029   BUN 50 (H) 08/06/2020 0712   CREATININE 1.25 (H) 08/08/2020 0126   CREATININE 1.07 (H) 08/07/2020 0029   CREATININE 1.97 (H) 08/06/2020 0712   CALCIUM 8.5 (L) 08/08/2020 0126   CALCIUM 8.4 (L) 08/07/2020 0029   CALCIUM 8.5 (L) 08/06/2020 0712   GFRNONAA 52 (L) 08/08/2020 0126    GFRNONAA >60 08/07/2020 0029   GFRNONAA 30 (L) 08/06/2020 0712   GFRAA >60 05/06/2019 1723   GFRAA >60 01/08/2018 0131   GFRAA >60 10/21/2016 0504   CBC    Component Value Date/Time   WBC 15.4 (H) 08/09/2020 0234   RBC 4.05 08/09/2020 0234   HGB 9.7 (L) 08/09/2020 0234   HCT 31.0 (L) 08/09/2020 0234   PLT 362 08/09/2020 0234   MCV 76.5 (L) 08/09/2020 0234   MCH 24.0 (L) 08/09/2020 0234   MCHC 31.3 08/09/2020 0234   RDW 17.1 (H) 08/09/2020 0234   LYMPHSABS 2.0 08/05/2020 0115   MONOABS 0.8 08/05/2020 0115   EOSABS 0.3 08/05/2020 0115   BASOSABS 0.0 08/05/2020 0115   HEPATIC Function Panel Recent Labs    08/06/20 0712 08/07/20 0029 08/08/20 0126  PROT 5.7* 6.5 6.6   HEMOGLOBIN A1C No components found for: HGA1C,  MPG CARDIAC ENZYMES Lab Results  Component Value Date   TROPONINI <0.03 10/17/2016   TROPONINI <0.03 10/17/2016   TROPONINI <0.03 10/17/2016   BNP No results for input(s): PROBNP in the last 8760 hours. TSH Recent Labs    08/05/20 0732  TSH 1.264   CHOLESTEROL No results for input(s): CHOL in the last 8760 hours.  Scheduled Meds: . apixaban  5 mg Oral BID  . colchicine  0.6 mg Oral  Daily  . diltiazem  240 mg Oral Daily  . furosemide  40 mg Intravenous Once  . hydrocortisone cream   Topical BID  . influenza vac split quadrivalent PF  0.5 mL Intramuscular Tomorrow-1000  . insulin aspart  0-20 Units Subcutaneous TID WC  . insulin aspart  6 Units Subcutaneous TID WC  . insulin glargine  10 Units Subcutaneous QHS  . loratadine  10 mg Oral Daily  . metoprolol tartrate  12.5 mg Oral BID  . pantoprazole  40 mg Oral Q1200  . potassium chloride  40 mEq Oral BID  . predniSONE  30 mg Oral Q breakfast   Continuous Infusions: . albumin human    . remdesivir 100 mg in NS 100 mL 100 mg (08/08/20 1030)   PRN Meds:.acetaminophen **OR** acetaminophen, albuterol, butalbital-acetaminophen-caffeine, chlorpheniramine-HYDROcodone  Assessment/Plan: Acute  myocarditis/pericarditis Abnormal troponin I from demand ischemia Acute diastolic left heart failure Paroxysmal atrial fibrillation  COVID-19 pneumonia Morbid obesity Asthma Ieon deficiency anemia Hypoalbuminemia Type 2 DM  Continue sliding scale. Dietary consult. Consider Jardiance or Ozempic use in future post tapering off prednisolone and diet changes. F/U in 1-2 weeks in office    LOS: 4 days   Time spent including chart review, lab review, examination, discussion with patient : 30 min   Orpah Cobb  MD  08/09/2020, 9:18 AM

## 2020-08-09 NOTE — Progress Notes (Signed)
[facility] PROGRESS NOTE    Progress Note  Sheri Park  LFY:[id number] DOB: April 18, 1966 DOA: 08/05/2020 PCP: Orpah Cobb, MD     Brief Narrative:   Sheri Park is an 55 y.o. female morbidly obese, history of asthma iron deficiency anemia, unvaccinated presents to the ED on 08/05/2018 with cough, fatigue shortness of breath chest pain fever and chills.  On 07/27/2020 she had a Covid test which was negative, she continued to have productive cough and a.  Rash.  Went to urgent care and tested positive for COVID-19 on 07/23/2020.  Came into the ED on 08/05/2020 with A. fib troponins of 1500 chest x-ray unremarkable. CT scan of the chest ruled out PE showed bilateral atypical infiltrates. Cardiology was consulted she was started on IV Cardizem.  Now in sinus rhythm  Assessment/Plan:   A. fib with RVR: Likely is Now transition to Eliquis. Continue metoprolol and diltiazem. Appreciate cardiology's assistance. 2D echo showed no acute abnormalities.  Elevated troponins: Suspect likely due to RVR prior cath in 2018 showed normal coronaries. 2D echo showed preserved EF cardiology was consulted recommended no further ischemic work-up continue metoprolol. She was given 2 doses of IV Lasix yesterday as she appeared fluid overloaded. I's and O's are poorly recorded, her creatinine has increased. Stop diuresis.  SARS-CoV-2 infection/Covid pneumonia: She tested + 07/23/2020 she is unvaccinated initially there was a concern for PE but CTA was negative for PE and showed bilateral infiltrates. Continue to wean off steroids quickly.  Urticarial rash: Improving with steroids and Claritin.  Mild acute kidney injury: No results.  Morbid obesity: Counseled.  Hyperglycemia: Likely due to steroids.  With a hemoglobin A1c of 6.6. She will need to go home on Metformin, her blood glucose this morning is 106.   DVT prophylaxis: Eliquis Family Communication: None Status is:  Inpatient  Remains inpatient appropriate because:Hemodynamically unstable   Dispo: The patient is from: Home              Anticipated d/c is to: Home              Anticipated d/c date is: 2 days              Patient currently is not medically stable to d/c.   Difficult to place patient No        Code Status:     Code Status Orders  (From admission, onward)         Start     Ordered   08/05/20 0701  Full code  Continuous        08/05/20 0701        Code Status History    Date Active Date Inactive Code Status Order ID Comments User Context   10/17/2016 1123 10/21/2016 1745 Full Code [id number]  Orpah Cobb, MD Inpatient   Advance Care Planning Activity        IV Access:    Peripheral IV   Procedures and diagnostic studies:   No results found.   Medical Consultants:    None.  Anti-Infectives:   IV remdesivir.  Subjective:    Sheri Park still winded when she ambulates.  Objective:    Vitals:   08/08/20 2016 08/08/20 2346 08/09/20 0330 08/09/20 0843  BP: (!) 150/100 123/72 128/83 (!) 155/109  Pulse: 78 70 74 76  Resp: 20 18 18 18   Temp: 97.6 F (36.4 C) 97.6 F (36.4 C) 97.6 F (36.4 C) (!) 97.4 F (36.3 C)  TempSrc:  Oral Oral Oral Oral  SpO2: 100% 97% 98% 97%  Weight:      Height:       SpO2: 97 % O2 Flow Rate (L/min): 4 L/min   Intake/Output Summary (Last 24 hours) at 08/09/2020 0859 Last data filed at 08/08/2020 2016 Gross per 24 hour  Intake 763.6 ml  Output --  Net 763.6 ml   Filed Weights   08/05/20 0700  Weight: (!) 152.8 kg    Exam: General exam: In no acute distress. Respiratory system: Good air movement and diffuse crackles at bases. Cardiovascular system: S1 & S2 heard, RRR. No JVD. Gastrointestinal system: Abdomen is nondistended, soft and nontender.  Extremities: No pedal edema. Skin: No rashes, lesions or ulcers Psychiatry: Judgement and insight appear normal. Mood & affect appropriate.  Data  Reviewed:    Labs: Basic Metabolic Panel: Recent Labs  Lab 08/05/20 0115 08/05/20 0729 08/06/20 0712 08/07/20 0029 08/08/20 0126  NA 134* 136 141 136 135  K 3.0* 3.3* 4.0 4.1 4.2  CL 101 102 109 104 104  CO2 22 23 21* 22 22  GLUCOSE 156* 113* 102* 172* 258*  BUN 11 11 50* 16 26*  CREATININE 1.40* 1.18* 1.97* 1.07* 1.25*  CALCIUM 8.1* 8.0* 8.5* 8.4* 8.5*  MG  --  1.8  --   --   --    GFR Estimated Creatinine Clearance: 77.2 mL/min (A) (by C-G formula based on SCr of 1.25 mg/dL (H)). Liver Function Tests: Recent Labs  Lab 08/05/20 0115 08/06/20 0712 08/07/20 0029 08/08/20 0126  AST 27 21 20 20   ALT 18 29 21 24   ALKPHOS 47 25* 50 56  BILITOT 1.1 1.1 0.5 0.7  PROT 6.8 5.7* 6.5 6.6  ALBUMIN 2.8* 2.2* 2.5* 2.6*   No results for input(s): LIPASE, AMYLASE in the last 168 hours. No results for input(s): AMMONIA in the last 168 hours. Coagulation profile Recent Labs  Lab 08/05/20 0115  INR 1.1   COVID-19 Labs  Recent Labs    08/07/20 0029 08/08/20 0126  DDIMER 5.81* 3.43*  FERRITIN 103 87  CRP 17.8* 8.8*    Lab Results  Component Value Date   SARSCOV2NAA NEGATIVE 08/01/2020    CBC: Recent Labs  Lab 08/05/20 0115 08/05/20 0732 08/06/20 0712 08/07/20 0029 08/08/20 0126 08/09/20 0234  WBC 11.1* 9.3 8.9 13.4* 13.4* 15.4*  NEUTROABS 7.9*  --   --   --   --   --   HGB 10.2* 9.8* 10.5* 9.3* 9.6* 9.7*  HCT 31.2* 32.1* 32.3* 29.2* 30.4* 31.0*  MCV 75.7* 78.1* 82.8 76.4* 76.6* 76.5*  PLT 275 258 338 304 316 362   Cardiac Enzymes: No results for input(s): CKTOTAL, CKMB, CKMBINDEX, TROPONINI in the last 168 hours. BNP (last 3 results) No results for input(s): PROBNP in the last 8760 hours. CBG: Recent Labs  Lab 08/08/20 1110 08/08/20 1707 08/08/20 2158 08/09/20 0623  GLUCAP 165* 149* 132* 106*   D-Dimer: Recent Labs    08/07/20 0029 08/08/20 0126  DDIMER 5.81* 3.43*   Hgb A1c: Recent Labs    08/08/20 0909  HGBA1C 6.6*   Lipid  Profile: No results for input(s): CHOL, HDL, LDLCALC, TRIG, CHOLHDL, LDLDIRECT in the last 72 hours. Thyroid function studies: No results for input(s): TSH, T4TOTAL, T3FREE, THYROIDAB in the last 72 hours.  Invalid input(s): FREET3 Anemia work up: Recent Labs    08/07/20 0029 08/08/20 0126  FERRITIN 103 87   Sepsis Labs: Recent Labs  Lab 08/05/20 0123 08/05/20  0424 08/05/20 0729 08/05/20 0732 08/06/20 0712 08/07/20 0029 08/08/20 0126 08/09/20 0234  PROCALCITON  --   --  0.24  --   --   --   --   --   WBC  --   --   --    < > 8.9 13.4* 13.4* 15.4*  LATICACIDVEN 1.8 1.0  --   --   --   --   --   --    < > = values in this interval not displayed.   Microbiology Recent Results (from the past 240 hour(s))  SARS CORONAVIRUS 2 (TAT 6-24 HRS) Nasopharyngeal Nasopharyngeal Swab     Status: None   Collection Time: 08/01/20  2:56 PM   Specimen: Nasopharyngeal Swab  Result Value Ref Range Status   SARS Coronavirus 2 NEGATIVE NEGATIVE Final    Comment: (NOTE) SARS-CoV-2 target nucleic acids are NOT DETECTED.  The SARS-CoV-2 RNA is generally detectable in upper and lower respiratory specimens during the acute phase of infection. Negative results do not preclude SARS-CoV-2 infection, do not rule out co-infections with other pathogens, and should not be used as the sole basis for treatment or other patient management decisions. Negative results must be combined with clinical observations, patient history, and epidemiological information. The expected result is Negative.  Fact Sheet for Patients: HairSlick.no  Fact Sheet for Healthcare Providers: quierodirigir.com  This test is not yet approved or cleared by the [place] FDA and  has been authorized for detection and/or diagnosis of SARS-CoV-2 by FDA under an Emergency Use Authorization (EUA). This EUA will remain  in effect (meaning this test can be used) for the  duration of the COVID-19 declaration under Se ction 564(b)(1) of the Act, 21 U.S.C. section 360bbb-3(b)(1), unless the authorization is terminated or revoked sooner.  Performed at [facility], [address] [place], [place] [address]   Blood Culture (routine x 2)     Status: None (Preliminary result)   Collection Time: 08/05/20  1:15 AM   Specimen: BLOOD  Result Value Ref Range Status   Specimen Description BLOOD RIGHT ANTECUBITAL  Final   Special Requests   Final    BOTTLES DRAWN AEROBIC AND ANAEROBIC Blood Culture adequate volume   Culture   Final    NO GROWTH 3 DAYS Performed at [facility], [address] [place], [place] [address]    Report Status PENDING  Incomplete  Urine culture     Status: Abnormal   Collection Time: 08/05/20  4:13 AM   Specimen: Urine, Random  Result Value Ref Range Status   Specimen Description URINE, RANDOM  Final   Special Requests   Final    NONE Performed at [facility], [address], [place], [place] [address]    Culture MULTIPLE SPECIES PRESENT, SUGGEST RECOLLECTION (A)  Final   Report Status 08/06/2020 FINAL  Final  Culture, blood (Routine X 2) w Reflex to ID Panel     Status: None (Preliminary result)   Collection Time: 08/06/20 10:05 AM   Specimen: BLOOD RIGHT ARM  Result Value Ref Range Status   Specimen Description BLOOD RIGHT ARM  Final   Special Requests   Final    BOTTLES DRAWN AEROBIC AND ANAEROBIC Blood Culture adequate volume   Culture   Final    NO GROWTH 2 DAYS Performed at [facility], [address] [place], [place] [address]    Report Status PENDING  Incomplete  Medications:   . apixaban  5 mg Oral BID  . colchicine  0.6 mg Oral Daily  . diltiazem  240 mg Oral Daily  . hydrocortisone cream   Topical BID  . influenza vac split quadrivalent PF  0.5 mL Intramuscular Tomorrow-1000  . insulin aspart  0-20 Units Subcutaneous TID WC  . insulin aspart  6 Units Subcutaneous TID WC  .  insulin glargine  10 Units Subcutaneous QHS  . loratadine  10 mg Oral Daily  . metoprolol tartrate  12.5 mg Oral BID  . pantoprazole  40 mg Oral Q1200  . predniSONE  50 mg Oral Q breakfast   Continuous Infusions: . remdesivir 100 mg in NS 100 mL 100 mg (08/08/20 1030)      LOS: 4 days   Marinda Elk  [facility]  08/09/2020, 8:59 AM

## 2020-08-10 DIAGNOSIS — I4891 Unspecified atrial fibrillation: Secondary | ICD-10-CM | POA: Diagnosis not present

## 2020-08-10 DIAGNOSIS — I214 Non-ST elevation (NSTEMI) myocardial infarction: Secondary | ICD-10-CM | POA: Diagnosis not present

## 2020-08-10 DIAGNOSIS — N179 Acute kidney failure, unspecified: Secondary | ICD-10-CM | POA: Diagnosis not present

## 2020-08-10 DIAGNOSIS — A419 Sepsis, unspecified organism: Secondary | ICD-10-CM | POA: Diagnosis not present

## 2020-08-10 LAB — CULTURE, BLOOD (ROUTINE X 2)
Culture: NO GROWTH
Special Requests: ADEQUATE

## 2020-08-10 LAB — BASIC METABOLIC PANEL
Anion gap: 10 (ref 5–15)
BUN: 21 mg/dL — ABNORMAL HIGH (ref 6–20)
CO2: 26 mmol/L (ref 22–32)
Calcium: 8.6 mg/dL — ABNORMAL LOW (ref 8.9–10.3)
Chloride: 103 mmol/L (ref 98–111)
Creatinine, Ser: 0.99 mg/dL (ref 0.44–1.00)
GFR, Estimated: >60 mL/min (ref >60)
Glucose, Bld: 85 mg/dL (ref 70–99)
Potassium: 4.3 mmol/L (ref 3.5–5.1)
Sodium: 139 mmol/L (ref 135–145)

## 2020-08-10 LAB — GLUCOSE, CAPILLARY
Glucose-Capillary: 85 mg/dL (ref 70–99)
Glucose-Capillary: 119 mg/dL — ABNORMAL HIGH (ref 70–99)

## 2020-08-10 MED ORDER — APIXABAN 5 MG PO TABS: 5.0000 mg | ORAL_TABLET | Freq: Two times a day (BID) | ORAL | 3 refills | Status: DC

## 2020-08-10 MED ORDER — DILTIAZEM HCL ER COATED BEADS 240 MG PO CP24: 240.0000 mg | ORAL_CAPSULE | Freq: Every day | ORAL | 3 refills | Status: DC

## 2020-08-10 MED ORDER — METOPROLOL TARTRATE 25 MG PO TABS: 12.5000 mg | ORAL_TABLET | Freq: Two times a day (BID) | ORAL | 3 refills | Status: DC

## 2020-08-10 MED ORDER — METFORMIN HCL 500 MG PO TABS: 500.0000 mg | ORAL_TABLET | Freq: Two times a day (BID) | ORAL | 11 refills | Status: DC

## 2020-08-10 MED ORDER — BLOOD GLUCOSE MONITOR KIT
PACK | 0 refills | Status: DC
Start: 2020-08-10 — End: 2023-10-30

## 2020-08-10 MED FILL — METOPROLOL TARTRATE 25 MG T: 25 | 60 days supply | Qty: 60 | Fill #0

## 2020-08-10 MED FILL — CARTIA XT 240 MG CAPSULE: 240 | 30 days supply | Qty: 30 | Fill #0

## 2020-08-10 MED FILL — metFORMIN HCL 500 MG TABS: 500 | 30 days supply | Qty: 60 | Fill #0

## 2020-08-10 MED FILL — ELIQUIS 5 MG TABLET: 5 | 30 days supply | Qty: 60 | Fill #0

## 2020-08-10 NOTE — Discharge Summary (Signed)
Physician Discharge Summary  Sheri Park QIH:[id number] DOB: 28-Jun-1966 DOA: 08/05/2020  PCP: Orpah Cobb, MD  Admit date: 08/05/2020 Discharge date: 08/10/2020  Admitted From: Home  Disposition: Home   \Recommendations for Outpatient Follow-up:  1. Follow up with PCP in 1-2 weeks 2. Please obtain BMP/CBC in one week   Home Health: None Equipment/Devices: None  Discharge Condition: Stable CODE STATUS: Full Diet recommendation: Heart Healthy   Brief/Interim Summary: 55 y.o. female morbidly obese, history of asthma iron deficiency anemia, unvaccinated presents to the ED on 08/05/2018 with cough, fatigue shortness of breath chest pain fever and chills.  On 07/27/2020 she had a Covid test which was negative, she continued to have productive cough and a.  Rash.  Went to urgent care and tested positive for COVID-19 on 07/23/2020.  Came into the ED on 08/05/2020 with A. fib troponins of 1500 chest x-ray unremarkable. CT scan of the chest ruled out PE showed bilateral atypical infiltrates.  Discharge Diagnoses:  Principal Problem:   Sepsis (HCC) Active Problems:   Atrial fibrillation with rapid ventricular response (HCC)   Chest pain   Urticaria   AKI (acute kidney injury) (HCC)  A. fib with RVR likely due to infectious etiology: Cardiology was consulted she was started on diltiazem drip and metoprolol and IV heparin 2D echo was done that showed no acute findings. She was transitioned to oral Eliquis, metoprolol and diltiazem should continue as an outpatient.  Elevated troponins: Likely due to RVR prior cath in 2018 showed normal coronaries cardiology was consulted recommended no further evaluation.  SARS-CoV-2 infection/Covid pneumonia: She tested positive on 07/23/2020 she is on vaccinated. CT scan with contrast was negative for PE but did show bilateral infiltrates. She required oxygen temporarily she was started on IV steroids and remdesivir she completed her treatment in  house.  Allergic area rash: Improved with steroids and Claritin.  Acute kidney injury: Likely prerenal azotemia resolved with IV fluid hydration.  Newly diagnosed diabetes mellitus type 2 with hyperglycemia: Her A1c was 6.6 she was covered with minimal insulin here in the hospital she was started on Metformin low-dose which she will continue as an outpatient.  Discharge Instructions  Discharge Instructions    Diet - low sodium heart healthy   Complete by: As directed    Increase activity slowly   Complete by: As directed      Allergies as of 08/10/2020      Reactions   Peanut-containing Drug Products Anaphylaxis   Oil, nuts, anything peanut related   Shellfish Allergy Anaphylaxis   Codeine Itching   Ketorolac Tromethamine Swelling   Toradol [ketorolac Tromethamine] Swelling   Mucinex [guaifenesin Er] Rash      Medication List    TAKE these medications   acetaminophen 500 MG tablet Commonly known as: TYLENOL Take 2 tablets (1,000 mg total) by mouth every 8 (eight) hours as needed. What changed: reasons to take this   albuterol 108 (90 Base) MCG/ACT inhaler Commonly known as: VENTOLIN HFA Inhale 1-2 puffs into the lungs every 6 (six) hours as needed for wheezing or shortness of breath.   amLODipine 5 MG tablet Commonly known as: NORVASC Take 1 tablet (5 mg total) by mouth daily.   apixaban 5 MG Tabs tablet Commonly known as: ELIQUIS Take 1 tablet (5 mg total) by mouth 2 (two) times daily.   [employer] D3 25 MCG (1000 UT) capsule Generic drug: Cholecalciferol Take 1,000 Units by mouth daily.   cyclobenzaprine 5 MG tablet Commonly known as:  FLEXERIL Take 1 tablet (5 mg total) by mouth at bedtime.   diltiazem 240 MG 24 hr capsule Commonly known as: CARDIZEM CD Take 1 capsule (240 mg total) by mouth daily.   hydrochlorothiazide 12.5 MG tablet Commonly known as: HYDRODIURIL Take 12.5 mg by mouth daily.   metFORMIN 500 MG tablet Commonly known as: Glucophage Take  1 tablet (500 mg total) by mouth 2 (two) times daily with a meal.   metoprolol tartrate 25 MG tablet Commonly known as: LOPRESSOR Take 0.5 tablets (12.5 mg total) by mouth 2 (two) times daily.   OVER THE COUNTER MEDICATION Take 1 tablet by mouth daily. Macca- vitamin   triamcinolone 0.1 % Commonly known as: KENALOG Apply 1 application topically 2 (two) times daily.   vitamin B-12 1000 MCG tablet Commonly known as: CYANOCOBALAMIN Take 1,000 mcg by mouth daily.   vitamin C 250 MG tablet Commonly known as: ASCORBIC ACID Take 250 mg by mouth daily.   ZINC-220 PO Take 220 mg by mouth daily.       Allergies  Allergen Reactions  . Peanut-Containing Drug Products Anaphylaxis    Oil, nuts, anything peanut related  . Shellfish Allergy Anaphylaxis  . Codeine Itching  . Ketorolac Tromethamine Swelling  . Toradol [Ketorolac Tromethamine] Swelling  . Mucinex [Guaifenesin Er] Rash    Consultations:  Cardiology   Procedures/Studies: DG Chest 2 View  Result Date: 08/01/2020 CLINICAL DATA:  Shortness of breath. EXAM: CHEST - 2 VIEW COMPARISON:  Chest x-ray 05/06/2019. FINDINGS: Mediastinum and hilar structures normal. Low lung volumes with mild bibasilar atelectasis. No pleural effusion or pneumothorax. IMPRESSION: Low lung volumes with mild bibasilar atelectasis. Electronically Signed   By: Maisie Fus  Register   On: 08/01/2020 08:14   CT ANGIO CHEST PE W OR WO CONTRAST  Result Date: 08/05/2020 CLINICAL DATA:  Patient with cough, congestion and fatigue. Evaluate for pulmonary embolus. EXAM: CT ANGIOGRAPHY CHEST WITH CONTRAST TECHNIQUE: Multidetector CT imaging of the chest was performed using the standard protocol during bolus administration of intravenous contrast. Multiplanar CT image reconstructions and MIPs were obtained to evaluate the vascular anatomy. CONTRAST:  75mL OMNIPAQUE IOHEXOL 350 MG/ML SOLN COMPARISON:  Chest radiograph earlier same day. FINDINGS: Cardiovascular: Heart is  enlarged. Small volume pericardial effusion. Edema within the superior mediastinal soft tissues. Adequate opacification of the pulmonary artery. Motion artifact limits evaluation. No evidence for central, main, lobar or proximal segmental pulmonary embolus. Evaluation more distally is limited due to motion artifact. Mediastinum/Nodes: No enlarged axillary, mediastinal hilar lymphadenopathy. Small hiatal hernia. Lungs/Pleura: There are scattered patchy areas of ground-glass attenuation within the lungs bilaterally. No pleural effusion or pneumothorax. Atelectasis within the lung bases bilaterally. Upper Abdomen: No acute process. Musculoskeletal: Thoracic spine degenerative changes. No aggressive or acute appearing osseous lesions. Review of the MIP images confirms the above findings. IMPRESSION: 1. No evidence for acute pulmonary embolus. Evaluation more distally is limited due to motion artifact. 2. Cardiomegaly. Small volume pericardial effusion. 3. Scattered patchy areas of ground-glass attenuation predominately within the lungs bilaterally which may represent atypical infectious process (COVID-19). Electronically Signed   By: Annia Belt M.D.   On: 08/05/2020 13:19   DG Chest Port 1 View  Result Date: 08/05/2020 CLINICAL DATA:  Sepsis, cough EXAM: PORTABLE CHEST 1 VIEW COMPARISON:  08/01/2020 FINDINGS: The heart size and mediastinal contours are within normal limits. Both lungs are clear. The visualized skeletal structures are unremarkable. IMPRESSION: No active disease. Electronically Signed   By: Helyn Numbers MD   On:  08/05/2020 01:10   ECHOCARDIOGRAM COMPLETE  Result Date: 08/06/2020    ECHOCARDIOGRAM REPORT   Patient Name:   Clelia Croft Date of Exam: 08/06/2020 Medical Rec #:  [id number]           Height:       64.0 in Accession #:    [id number]          Weight:       336.9 lb Date of Birth:  1965-11-24          BSA:          2.442 m Patient Age:    54 years            BP:           135/93  mmHg Patient Gender: F                   HR:           82 bpm. Exam Location:  Inpatient Procedure: 2D Echo, 3D Echo, Cardiac Doppler and Color Doppler Indications:     I48.91* Unspeicified atrial fibrillation  History:         Patient has no prior history of Echocardiogram examinations.                  Abnormal ECG, Arrythmias:Atrial Fibrillation;                  Signs/Symptoms:Bacteremia and Chest Pain. Covid positive.  Sonographer:     Sheralyn Boatman RDCS Referring Phys:  [id number] Orpah Cobb Diagnosing Phys: Orpah Cobb MD  Sonographer Comments: Patient is morbidly obese. Image acquisition challenging due to patient body habitus. Algie Coffer to read. IMPRESSIONS  1. Left ventricular ejection fraction, by estimation, is 65 to 70%. The left ventricle has normal function. The left ventricle has no regional wall motion abnormalities. There is moderate concentric left ventricular hypertrophy. Left ventricular diastolic parameters are consistent with Grade I diastolic dysfunction (impaired relaxation).  2. Right ventricular systolic function is normal. The right ventricular size is normal. There is moderately elevated pulmonary artery systolic pressure.  3. Left atrial size was moderately dilated.  4. Right atrial size was mildly dilated.  5. The pericardial effusion is circumferential. There is no evidence of cardiac tamponade.  6. The mitral valve is normal in structure. Mild mitral valve regurgitation.  7. The aortic valve is tricuspid. Aortic valve regurgitation is not visualized.  8. The inferior vena cava is dilated in size with <50% respiratory variability, suggesting right atrial pressure of 15 mmHg. FINDINGS  Left Ventricle: Left ventricular ejection fraction, by estimation, is 65 to 70%. The left ventricle has normal function. The left ventricle has no regional wall motion abnormalities. The left ventricular internal cavity size was normal in size. There is  moderate concentric left ventricular hypertrophy. Left  ventricular diastolic parameters are consistent with Grade I diastolic dysfunction (impaired relaxation). Right Ventricle: The right ventricular size is normal. No increase in right ventricular wall thickness. Right ventricular systolic function is normal. There is moderately elevated pulmonary artery systolic pressure. The tricuspid regurgitant velocity is 2.82 m/s, and with an assumed right atrial pressure of 15 mmHg, the estimated right ventricular systolic pressure is 46.8 mmHg. Left Atrium: Left atrial size was moderately dilated. Right Atrium: Right atrial size was mildly dilated. Pericardium: Trivial pericardial effusion is present. The pericardial effusion is circumferential. There is no evidence of cardiac tamponade. Mitral Valve: The mitral valve is normal in structure. Mild mitral valve  regurgitation. Tricuspid Valve: The tricuspid valve is normal in structure. Tricuspid valve regurgitation is mild. Aortic Valve: The aortic valve is tricuspid. Aortic valve regurgitation is not visualized. Pulmonic Valve: The pulmonic valve was normal in structure. Pulmonic valve regurgitation is not visualized. Aorta: The aortic root is normal in size and structure. There is minimal (Grade I) atheroma plaque involving the aortic root. Venous: The inferior vena cava is dilated in size with less than 50% respiratory variability, suggesting right atrial pressure of 15 mmHg. IAS/Shunts: The interatrial septum was not assessed.  LEFT VENTRICLE PLAX 2D LVIDd:         5.00 cm      Diastology LVIDs:         2.80 cm      LV e' medial:    7.83 cm/s LV PW:         1.50 cm      LV E/e' medial:  16.6 LV IVS:        1.40 cm      LV e' lateral:   10.00 cm/s LVOT diam:     2.20 cm      LV E/e' lateral: 13.0 LV SV:         124 LV SV Index:   51 LVOT Area:     3.80 cm  LV Volumes (MOD) LV vol d, MOD A2C: 116.0 ml LV vol d, MOD A4C: 73.2 ml LV vol s, MOD A2C: 32.0 ml LV vol s, MOD A4C: 24.7 ml LV SV MOD A2C:     84.0 ml LV SV MOD A4C:      73.2 ml LV SV MOD BP:      65.3 ml RIGHT VENTRICLE RV S prime:     18.60 cm/s TAPSE (M-mode): 2.6 cm LEFT ATRIUM             Index       RIGHT ATRIUM           Index LA diam:        3.50 cm 1.43 cm/m  RA Area:     19.10 cm LA Vol (A2C):   64.8 ml 26.54 ml/m RA Volume:   59.80 ml  24.49 ml/m LA Vol (A4C):   70.0 ml 28.67 ml/m LA Biplane Vol: 71.8 ml 29.41 ml/m  AORTIC VALVE LVOT Vmax:   153.00 cm/s LVOT Vmean:  113.000 cm/s LVOT VTI:    0.327 m  AORTA Ao Root diam: 3.30 cm MITRAL VALVE                TRICUSPID VALVE MV Area (PHT): 3.06 cm     TR Peak grad:   31.8 mmHg MV Decel Time: 248 msec     TR Vmax:        282.00 cm/s MV E velocity: 130.00 cm/s MV A velocity: 119.00 cm/s  SHUNTS MV E/A ratio:  1.09         Systemic VTI:  0.33 m                             Systemic Diam: 2.20 cm Orpah CobbAjay Kadakia MD Electronically signed by Orpah CobbAjay Kadakia MD Signature Date/Time: 08/06/2020/12:38:34 PM    Final    VAS US LOWER EXTREMITY VENOUS (DVT)  Result Date: 08/05/2020  Lower Venous DVT Study Indications: Edema, SOB.  Limitations: Body habitus and poor ultrasound/tissue interface. Comparison Study: No prior studies. Performing Technologist: Jean Rosenthalachel Hodge, RDMS  Examination Guidelines: A  complete evaluation includes B-mode imaging, spectral Doppler, color Doppler, and power Doppler as needed of all accessible portions of each vessel. Bilateral testing is considered an integral part of a complete examination. Limited examinations for reoccurring indications may be performed as noted. The reflux portion of the exam is performed with the patient in reverse Trendelenburg.  +---------+---------------+---------+-----------+----------+--------------+ RIGHT    CompressibilityPhasicitySpontaneityPropertiesThrombus Aging +---------+---------------+---------+-----------+----------+--------------+ CFV      Full           Yes      Yes                                  +---------+---------------+---------+-----------+----------+--------------+ SFJ      Full                                                        +---------+---------------+---------+-----------+----------+--------------+ FV Prox  Full                                                        +---------+---------------+---------+-----------+----------+--------------+ FV Mid                  Yes      Yes                                 +---------+---------------+---------+-----------+----------+--------------+ FV DistalFull           Yes      Yes                                 +---------+---------------+---------+-----------+----------+--------------+ PFV      Full                                                        +---------+---------------+---------+-----------+----------+--------------+ POP      Full           Yes      Yes                                 +---------+---------------+---------+-----------+----------+--------------+ PTV      Full                                                        +---------+---------------+---------+-----------+----------+--------------+   +---------+---------------+---------+-----------+----------+--------------+ LEFT     CompressibilityPhasicitySpontaneityPropertiesThrombus Aging +---------+---------------+---------+-----------+----------+--------------+ CFV      Full           Yes      Yes                                 +---------+---------------+---------+-----------+----------+--------------+  SFJ      Full                                                        +---------+---------------+---------+-----------+----------+--------------+ FV Prox  Full                                                        +---------+---------------+---------+-----------+----------+--------------+ FV Mid                  Yes      Yes                                  +---------+---------------+---------+-----------+----------+--------------+ FV Distal               Yes      Yes                                 +---------+---------------+---------+-----------+----------+--------------+ PFV      Full                                                        +---------+---------------+---------+-----------+----------+--------------+ POP      Full           Yes      Yes                                 +---------+---------------+---------+-----------+----------+--------------+ PTV      Full           Yes      Yes                                 +---------+---------------+---------+-----------+----------+--------------+     Summary: RIGHT: - There is no evidence of deep vein thrombosis in the lower extremity. However, portions of this examination were limited- see technologist comments above.  - No cystic structure found in the popliteal fossa.  LEFT: - There is no evidence of deep vein thrombosis in the lower extremity. However, portions of this examination were limited- see technologist comments above.  - No cystic structure found in the popliteal fossa.  *See table(s) above for measurements and observations. Electronically signed by Waverly Ferrari MD on 08/05/2020 at 11:57:07 AM.    Final     Subjective: No new complaints  Discharge Exam: Vitals:   08/10/20 0342 08/10/20 0840  BP: 139/84 (!) 152/103  Pulse: 68 71  Resp: 19 20  Temp: 98.2 F (36.8 C) (!) 97.4 F (36.3 C)  SpO2: 93% 98%   Vitals:   08/09/20 2053 08/09/20 2330 08/10/20 0342 08/10/20 0840  BP: (!) 159/109 (!) 159/106 139/84 (!) 152/103  Pulse: 90 64 68 71  Resp: 20 18 19 20   Temp: (!) 97.3 F (  36.3 C) 98 F (36.7 C) 98.2 F (36.8 C) (!) 97.4 F (36.3 C)  TempSrc: Oral Oral Oral Oral  SpO2: 98% 91% 93% 98%  Weight:      Height:        General: Pt is alert, awake, not in acute distress Cardiovascular: RRR, S1/S2 +, no rubs, no gallops Respiratory: CTA  bilaterally, no wheezing, no rhonchi Abdominal: Soft, NT, ND, bowel sounds + Extremities: no edema, no cyanosis    The results of significant diagnostics from this hospitalization (including imaging, microbiology, ancillary and laboratory) are listed below for reference.     Microbiology: Recent Results (from the past 240 hour(s))  SARS CORONAVIRUS 2 (TAT 6-24 HRS) Nasopharyngeal Nasopharyngeal Swab     Status: None   Collection Time: 08/01/20  2:56 PM   Specimen: Nasopharyngeal Swab  Result Value Ref Range Status   SARS Coronavirus 2 NEGATIVE NEGATIVE Final    Comment: (NOTE) SARS-CoV-2 target nucleic acids are NOT DETECTED.  The SARS-CoV-2 RNA is generally detectable in upper and lower respiratory specimens during the acute phase of infection. Negative results do not preclude SARS-CoV-2 infection, do not rule out co-infections with other pathogens, and should not be used as the sole basis for treatment or other patient management decisions. Negative results must be combined with clinical observations, patient history, and epidemiological information. The expected result is Negative.  Fact Sheet for Patients: HairSlick.no  Fact Sheet for Healthcare Providers: quierodirigir.com  This test is not yet approved or cleared by the [place] FDA and  has been authorized for detection and/or diagnosis of SARS-CoV-2 by FDA under an Emergency Use Authorization (EUA). This EUA will remain  in effect (meaning this test can be used) for the duration of the COVID-19 declaration under Se ction 564(b)(1) of the Act, 21 U.S.C. section 360bbb-3(b)(1), unless the authorization is terminated or revoked sooner.  Performed at [facility], [address] [place], [place] [address]   Blood Culture (routine x 2)     Status: None (Preliminary result)   Collection Time: 08/05/20  1:15 AM   Specimen: BLOOD  Result Value Ref Range  Status   Specimen Description BLOOD RIGHT ANTECUBITAL  Final   Special Requests   Final    BOTTLES DRAWN AEROBIC AND ANAEROBIC Blood Culture adequate volume   Culture   Final    NO GROWTH 4 DAYS Performed at [facility], [address] [place], [place] [address]    Report Status PENDING  Incomplete  Urine culture     Status: Abnormal   Collection Time: 08/05/20  4:13 AM   Specimen: Urine, Random  Result Value Ref Range Status   Specimen Description URINE, RANDOM  Final   Special Requests   Final    NONE Performed at [facility], [address] [place], [place] [address]    Culture MULTIPLE SPECIES PRESENT, SUGGEST RECOLLECTION (A)  Final   Report Status 08/06/2020 FINAL  Final  Culture, blood (Routine X 2) w Reflex to ID Panel     Status: None (Preliminary result)   Collection Time: 08/06/20 10:05 AM   Specimen: BLOOD RIGHT ARM  Result Value Ref Range Status   Specimen Description BLOOD RIGHT ARM  Final   Special Requests   Final    BOTTLES DRAWN AEROBIC AND ANAEROBIC Blood Culture adequate volume   Culture   Final    NO GROWTH 3 DAYS Performed at [facility], [address],  [place], [place] [address]    Report Status PENDING  Incomplete     Labs: BNP (last 3 results) Recent Labs    08/05/20 0329  BNP 113.0*   Basic Metabolic Panel: Recent Labs  Lab 08/05/20 0729 08/06/20 0712 08/07/20 0029 08/08/20 0126 08/09/20 1010 08/10/20 0322  NA 136 141 136 135 140 139  K 3.3* 4.0 4.1 4.2 3.9 4.3  CL 102 109 104 104 105 103  CO2 23 21* 22 22 25 26   GLUCOSE 113* 102* 172* 258* 106* 85  BUN 11 50* 16 26* 23* 21*  CREATININE 1.18* 1.97* 1.07* 1.25* 1.09* 0.99  CALCIUM 8.0* 8.5* 8.4* 8.5* 8.7* 8.6*  MG 1.8  --   --   --   --   --    Liver Function Tests: Recent Labs  Lab 08/05/20 0115 08/06/20 0712 08/07/20 0029 08/08/20 0126  AST 27 21 20 20   ALT 18 29 21 24   ALKPHOS 47 25* 50 56  BILITOT 1.1 1.1 0.5 0.7  PROT 6.8 5.7* 6.5 6.6   ALBUMIN 2.8* 2.2* 2.5* 2.6*   No results for input(s): LIPASE, AMYLASE in the last 168 hours. No results for input(s): AMMONIA in the last 168 hours. CBC: Recent Labs  Lab 08/05/20 0115 08/05/20 0732 08/06/20 0712 08/07/20 0029 08/08/20 0126 08/09/20 0234  WBC 11.1* 9.3 8.9 13.4* 13.4* 15.4*  NEUTROABS 7.9*  --   --   --   --   --   HGB 10.2* 9.8* 10.5* 9.3* 9.6* 9.7*  HCT 31.2* 32.1* 32.3* 29.2* 30.4* 31.0*  MCV 75.7* 78.1* 82.8 76.4* 76.6* 76.5*  PLT 275 258 338 304 316 362   Cardiac Enzymes: No results for input(s): CKTOTAL, CKMB, CKMBINDEX, TROPONINI in the last 168 hours. BNP: Invalid input(s): POCBNP CBG: Recent Labs  Lab 08/09/20 0623 08/09/20 1150 08/09/20 1652 08/09/20 2102 08/10/20 0617  GLUCAP 106* 136* 114* 120* 85   D-Dimer Recent Labs    08/08/20 0126  DDIMER 3.43*   Hgb A1c Recent Labs    08/08/20 0909  HGBA1C 6.6*   Lipid Profile No results for input(s): CHOL, HDL, LDLCALC, TRIG, CHOLHDL, LDLDIRECT in the last 72 hours. Thyroid function studies No results for input(s): TSH, T4TOTAL, T3FREE, THYROIDAB in the last 72 hours.  Invalid input(s): FREET3 Anemia work up Recent Labs    08/08/20 0126  FERRITIN 87   Urinalysis    Component Value Date/Time   COLORURINE AMBER (A) 08/05/2020 0420   APPEARANCEUR TURBID (A) 08/05/2020 0420   LABSPEC 1.017 08/05/2020 0420   PHURINE 5.0 08/05/2020 0420   GLUCOSEU NEGATIVE 08/05/2020 0420   HGBUR LARGE (A) 08/05/2020 0420   BILIRUBINUR NEGATIVE 08/05/2020 0420   KETONESUR NEGATIVE 08/05/2020 0420   PROTEINUR 100 (A) 08/05/2020 0420   UROBILINOGEN 0.2 12/22/2019 1136   NITRITE NEGATIVE 08/05/2020 0420   LEUKOCYTESUR NEGATIVE 08/05/2020 0420   Sepsis Labs Invalid input(s): PROCALCITONIN,  WBC,  LACTICIDVEN Microbiology Recent Results (from the past 240 hour(s))  SARS CORONAVIRUS 2 (TAT 6-24 HRS) Nasopharyngeal Nasopharyngeal Swab     Status: None   Collection Time: 08/01/20  2:56 PM    Specimen: Nasopharyngeal Swab  Result Value Ref Range Status   SARS Coronavirus 2 NEGATIVE NEGATIVE Final    Comment: (NOTE) SARS-CoV-2 target nucleic acids are NOT DETECTED.  The SARS-CoV-2 RNA is generally detectable in upper and lower respiratory specimens during the acute phase of infection. Negative results do not preclude SARS-CoV-2 infection, do not rule out co-infections with other  pathogens, and should not be used as the sole basis for treatment or other patient management decisions. Negative results must be combined with clinical observations, patient history, and epidemiological information. The expected result is Negative.  Fact Sheet for Patients: HairSlick.no  Fact Sheet for Healthcare Providers: quierodirigir.com  This test is not yet approved or cleared by the [place] FDA and  has been authorized for detection and/or diagnosis of SARS-CoV-2 by FDA under an Emergency Use Authorization (EUA). This EUA will remain  in effect (meaning this test can be used) for the duration of the COVID-19 declaration under Se ction 564(b)(1) of the Act, 21 U.S.C. section 360bbb-3(b)(1), unless the authorization is terminated or revoked sooner.  Performed at [facility], [address], [place], [place] [address]   Blood Culture (routine x 2)     Status: None (Preliminary result)   Collection Time: 08/05/20  1:15 AM   Specimen: BLOOD  Result Value Ref Range Status   Specimen Description BLOOD RIGHT ANTECUBITAL  Final   Special Requests   Final    BOTTLES DRAWN AEROBIC AND ANAEROBIC Blood Culture adequate volume   Culture   Final    NO GROWTH 4 DAYS Performed at [facility], [address] [place], [place] [address]    Report Status PENDING  Incomplete  Urine culture     Status: Abnormal   Collection Time: 08/05/20  4:13 AM   Specimen: Urine, Random  Result Value Ref Range Status   Specimen Description  URINE, RANDOM  Final   Special Requests   Final    NONE Performed at [facility], [address] [place], [place] [address]    Culture MULTIPLE SPECIES PRESENT, SUGGEST RECOLLECTION (A)  Final   Report Status 08/06/2020 FINAL  Final  Culture, blood (Routine X 2) w Reflex to ID Panel     Status: None (Preliminary result)   Collection Time: 08/06/20 10:05 AM   Specimen: BLOOD RIGHT ARM  Result Value Ref Range Status   Specimen Description BLOOD RIGHT ARM  Final   Special Requests   Final    BOTTLES DRAWN AEROBIC AND ANAEROBIC Blood Culture adequate volume   Culture   Final    NO GROWTH 3 DAYS Performed at [facility], [address] [place], [place] [address]    Report Status PENDING  Incomplete     Time coordinating discharge: Over 30 minutes  SIGNED:   Marinda Elk, MD  [facility] 08/10/2020, 9:25 AM Pager   If 7PM-7AM, please contact night-coverage [email or web address] Password [email or web address]

## 2020-08-10 NOTE — Progress Notes (Signed)
Discharge instructions (including medications) discussed with and copy provided to patient/caregiver 

## 2020-08-10 NOTE — Plan of Care (Signed)
  Problem: Education: Goal: Knowledge of General Education information will improve Description: Including pain rating scale, medication(s)/side effects and non-pharmacologic comfort measures Outcome: Adequate for Discharge   

## 2020-08-10 NOTE — Consult Note (Signed)
Ref: Orpah Cobb, MD   Subjective:  Feeling better. Awaiting discharge. VS stable. Stayed in sinus rhythm.  Objective:  Vital Signs in the last 24 hours: Temp:  [97.3 F (36.3 C)-98.2 F (36.8 C)] 97.4 F (36.3 C) (01/28 0840) Pulse Rate:  [64-90] 71 (01/28 0840) Resp:  [18-20] 20 (01/28 0840) BP: (139-159)/(83-109) 152/103 (01/28 0840) SpO2:  [91 %-98 %] 98 % (01/28 0840)  Physical Exam: BP Readings from Last 1 Encounters:  08/10/20 (!) 152/103     Wt Readings from Last 1 Encounters:  08/05/20 (!) 152.8 kg    Weight change:  Body mass index is 57.82 kg/m. HEENT: [place]/AT, Eyes-Brown, Conjunctiva-Pink, Sclera-Non-icteric Neck: No JVD, No bruit, Trachea midline. Lungs:  Clearing, Bilateral. Cardiac:  Regular rhythm, normal S1 and S2, no S3. II/VI systolic murmur. Abdomen:  Soft, non-tender. BS present. Extremities:  Trace edema present. No cyanosis. No clubbing. CNS: AxOx3, Cranial nerves grossly intact, moves all 4 extremities.  Skin: Warm and dry.   Intake/Output from previous day: 01/27 0701 - 01/28 0700 In: 120 [P.O.:120] Out: 1100 [Urine:1100]    Lab Results: BMET    Component Value Date/Time   NA 139 08/10/2020 0322   NA 140 08/09/2020 1010   NA 135 08/08/2020 0126   K 4.3 08/10/2020 0322   K 3.9 08/09/2020 1010   K 4.2 08/08/2020 0126   CL 103 08/10/2020 0322   CL 105 08/09/2020 1010   CL 104 08/08/2020 0126   CO2 26 08/10/2020 0322   CO2 25 08/09/2020 1010   CO2 22 08/08/2020 0126   GLUCOSE 85 08/10/2020 0322   GLUCOSE 106 (H) 08/09/2020 1010   GLUCOSE 258 (H) 08/08/2020 0126   BUN 21 (H) 08/10/2020 0322   BUN 23 (H) 08/09/2020 1010   BUN 26 (H) 08/08/2020 0126   CREATININE 0.99 08/10/2020 0322   CREATININE 1.09 (H) 08/09/2020 1010   CREATININE 1.25 (H) 08/08/2020 0126   CALCIUM 8.6 (L) 08/10/2020 0322   CALCIUM 8.7 (L) 08/09/2020 1010   CALCIUM 8.5 (L) 08/08/2020 0126   GFRNONAA >60 08/10/2020 0322   GFRNONAA >60 08/09/2020 1010    GFRNONAA 52 (L) 08/08/2020 0126   GFRAA >60 05/06/2019 1723   GFRAA >60 01/08/2018 0131   GFRAA >60 10/21/2016 0504   CBC    Component Value Date/Time   WBC 15.4 (H) 08/09/2020 0234   RBC 4.05 08/09/2020 0234   HGB 9.7 (L) 08/09/2020 0234   HCT 31.0 (L) 08/09/2020 0234   PLT 362 08/09/2020 0234   MCV 76.5 (L) 08/09/2020 0234   MCH 24.0 (L) 08/09/2020 0234   MCHC 31.3 08/09/2020 0234   RDW 17.1 (H) 08/09/2020 0234   LYMPHSABS 2.0 08/05/2020 0115   MONOABS 0.8 08/05/2020 0115   EOSABS 0.3 08/05/2020 0115   BASOSABS 0.0 08/05/2020 0115   HEPATIC Function Panel Recent Labs    08/06/20 0712 08/07/20 0029 08/08/20 0126  PROT 5.7* 6.5 6.6   HEMOGLOBIN A1C No components found for: HGA1C,  MPG CARDIAC ENZYMES Lab Results  Component Value Date   TROPONINI <0.03 10/17/2016   TROPONINI <0.03 10/17/2016   TROPONINI <0.03 10/17/2016   BNP No results for input(s): PROBNP in the last 8760 hours. TSH Recent Labs    08/05/20 0732  TSH 1.264   CHOLESTEROL No results for input(s): CHOL in the last 8760 hours.  Scheduled Meds: . apixaban  5 mg Oral BID  . colchicine  0.6 mg Oral Daily  . diltiazem  240 mg Oral  Daily  . hydrocortisone cream   Topical BID  . influenza vac split quadrivalent PF  0.5 mL Intramuscular Tomorrow-1000  . insulin aspart  0-20 Units Subcutaneous TID WC  . insulin aspart  6 Units Subcutaneous TID WC  . insulin glargine  10 Units Subcutaneous QHS  . loratadine  10 mg Oral Daily  . metoprolol tartrate  12.5 mg Oral BID  . pantoprazole  40 mg Oral Q1200  . predniSONE  30 mg Oral Q breakfast   Continuous Infusions: PRN Meds:.acetaminophen **OR** acetaminophen, albuterol, butalbital-acetaminophen-caffeine, chlorpheniramine-HYDROcodone  Assessment/Plan: Acute myocarditis/pericarditis Abnormal troponin I from demand ischemia Acute diastolic left heart failure Paroxysmal atrial fibrillation COVID-19 pneumonia Morbid obesity Asthma Iron deficiency  anemia Type 2 DM Hypoalbuminemia  Awaiting discharge home today. F/U in 1-2 weeks.    LOS: 5 days   Time spent including chart review, lab review, examination, discussion with patient :  min   Orpah Cobb  MD  08/10/2020, 10:42 AM

## 2020-08-10 NOTE — Progress Notes (Signed)
Physical Therapy Treatment Patient Details Name: Sheri Park MRN: [id number] DOB: [birth year] Today's Date: 08/10/2020    History of Present Illness Sheri Park is a 55 y.o. female with medical history significant of asthma, hypertension presenting to the ED via EMS for evaluation of shortness of breath, fatigue, cough, and poor oral intake for the past 5 days.  Febrile with EMS with temperature 104 F, was given 1 g Tylenol prior to arrival.  Episodes of SVT noted by EMS in route.  She was given 500 cc normal saline bolus and aspirin 324 mg. Patient states she tested positive for COVID-19 on 07/23/2020. She is not vaccinated against COVID or influenza.  She is a feeling ill for the past 1 week - having high fevers, chills, cough, and shortness of breath. About a week ago she took Mucinex and soon after started having a pruritic rash on her arms and legs. For the past 2 days she is having constant left-sided chest tightness. States recently she went to urgent care and they did another COVID test and she was told it was negative. Denies history of A. fib.    PT Comments    Pt seated in recliner.  Goal of session to progress to stair training.  Pt tolerated session well and maintaining O2 sats on RA.  Will continue to recommend rehab in a post out patient setting to continue to improve strength and endurance.     Follow Up Recommendations  Outpatient PT     Equipment Recommendations  None recommended by PT    Recommendations for Other Services       Precautions / Restrictions Precautions Precautions: Fall Precaution Comments: mod fall Restrictions Weight Bearing Restrictions: No    Mobility  Bed Mobility Overal bed mobility: Needs Assistance             General bed mobility comments: not assessed, patient in recliner  Transfers Overall transfer level: Independent Equipment used: None                Ambulation/Gait Ambulation/Gait assistance:  Independent Gait Distance (Feet): 40 Feet Assistive device: None Gait Pattern/deviations: WFL(Within Functional Limits);Step-to pattern Gait velocity: decr   General Gait Details: slow pace and required frequent rest periods but able to maintain SPo2.   Stairs Stairs: Yes Stairs assistance: Modified independent (Device/Increase time) Stair Management: One rail Left Number of Stairs: 10 General stair comments: Cues for sequencing and safety.   Wheelchair Mobility    Modified Rankin (Stroke Patients Only)       Balance Overall balance assessment: Modified Independent                                          Cognition Arousal/Alertness: Awake/alert Behavior During Therapy: WFL for tasks assessed/performed Overall Cognitive Status: Within Functional Limits for tasks assessed                                        Exercises      General Comments        Pertinent Vitals/Pain Pain Assessment: No/denies pain    Home Living                      Prior Function  PT Goals (current goals can now be found in the care plan section) Acute Rehab PT Goals Patient Stated Goal: to return home Potential to Achieve Goals: Good Progress towards PT goals: Progressing toward goals    Frequency    Min 2X/week      PT Plan Current plan remains appropriate    Co-evaluation              AM-PAC PT "6 Clicks" Mobility   Outcome Measure  Help needed turning from your back to your side while in a flat bed without using bedrails?: A Little Help needed moving from lying on your back to sitting on the side of a flat bed without using bedrails?: A Little Help needed moving to and from a bed to a chair (including a wheelchair)?: None Help needed standing up from a chair using your arms (e.g., wheelchair or bedside chair)?: None Help needed to walk in hospital room?: None Help needed climbing 3-5 steps with a railing? :  None 6 Click Score: 22    End of Session Equipment Utilized During Treatment: Gait belt Activity Tolerance: Patient limited by fatigue Patient left: in chair;with call bell/phone within reach Nurse Communication: Mobility status PT Visit Diagnosis: Muscle weakness (generalized) (M62.81);Difficulty in walking, not elsewhere classified (R26.2)     Time: 1018-1030 PT Time Calculation (min) (ACUTE ONLY): 12 min  Charges:  $Gait Training: 8-22 mins                     Bonney Leitz , PTA Acute Rehabilitation Services Pager [phone] Office [phone]     Kael Keetch Artis Delay 08/10/2020, 1:26 PM

## 2020-08-11 LAB — CULTURE, BLOOD (ROUTINE X 2)
Culture: NO GROWTH
Special Requests: ADEQUATE

## 2020-08-15 ENCOUNTER — Telehealth: Payer: Self-pay | Admitting: Pharmacist

## 2020-08-15 NOTE — Telephone Encounter (Signed)
Pharmacy Transitions of Care Follow-up Telephone Call  Date of discharge: 08/10/20  Discharge Diagnosis: A. Fib  How have you been since you were released from the hospital? Patient is doing ok.   Medication changes made at discharge: eliquis 5mg  po bid Metoprolol 25mg  (12.5mg  po bid ) Diltiazem 240mg  po qd Metformin 500mg  po bid  Medication changes obtained and verified? yes    Medication Accessibility:  Home Pharmacy: [facility]   Was the patient provided with refills on discharged medications? Yes   Have all prescriptions been transferred from [facility] to home pharmacy? yes  Is the patient able to afford medications? yes .     Medication Review:  APIXABEN (ELIQUIS)  - Will switch to apixaban 5 mg BID after 7 days (08/14/20).  - Discussed importance of taking medication around the same time everyday  - Reviewed potential DDIs with patient  - Advised patient of medications to avoid (NSAIDs, ASA)  - Educated that Tylenol (acetaminophen) will be the preferred analgesic to prevent risk of bleeding  - Emphasized importance of monitoring for signs and symptoms of bleeding (abnormal bruising, prolonged bleeding, nose bleeds, bleeding from gums, discolored urine, black tarry stools)  - Advised patient to alert all providers of anticoagulation therapy prior to starting a new medication or having a procedure     Follow-up Appointments:  PCP Hospital f/u appt confirmed? 08/21/20.     If their condition worsens, is the pt aware to call PCP or go to the Emergency Dept.? yes  Final Patient Assessment:  Patient is managing the medication changes well at home. She understood the importance of taking meds on time and also aware and using the resources at her disposal.

## 2021-08-06 DIAGNOSIS — E1165 Type 2 diabetes mellitus with hyperglycemia: Secondary | ICD-10-CM | POA: Diagnosis not present

## 2021-08-06 DIAGNOSIS — Z79899 Other long term (current) drug therapy: Secondary | ICD-10-CM | POA: Diagnosis not present

## 2021-08-06 DIAGNOSIS — E7849 Other hyperlipidemia: Secondary | ICD-10-CM | POA: Diagnosis not present

## 2021-08-06 DIAGNOSIS — D649 Anemia, unspecified: Secondary | ICD-10-CM | POA: Diagnosis not present

## 2021-08-06 DIAGNOSIS — E876 Hypokalemia: Secondary | ICD-10-CM | POA: Diagnosis not present

## 2021-08-07 ENCOUNTER — Emergency Department (HOSPITAL_COMMUNITY): Payer: Medicaid Other

## 2021-08-07 ENCOUNTER — Observation Stay (HOSPITAL_COMMUNITY)
Admission: EM | Admit: 2021-08-07 | Discharge: 2021-08-09 | Disposition: A | Discharge status: Home / Self Care | Payer: Medicaid Other | Attending: Family Medicine | Admitting: Family Medicine

## 2021-08-07 ENCOUNTER — Encounter (HOSPITAL_COMMUNITY): Payer: Self-pay

## 2021-08-07 DIAGNOSIS — Z7984 Long term (current) use of oral hypoglycemic drugs: Secondary | ICD-10-CM | POA: Insufficient documentation

## 2021-08-07 DIAGNOSIS — J45909 Unspecified asthma, uncomplicated: Secondary | ICD-10-CM | POA: Diagnosis not present

## 2021-08-07 DIAGNOSIS — R1013 Epigastric pain: Secondary | ICD-10-CM | POA: Insufficient documentation

## 2021-08-07 DIAGNOSIS — R748 Abnormal levels of other serum enzymes: Secondary | ICD-10-CM | POA: Diagnosis not present

## 2021-08-07 DIAGNOSIS — R0902 Hypoxemia: Secondary | ICD-10-CM | POA: Insufficient documentation

## 2021-08-07 DIAGNOSIS — I4891 Unspecified atrial fibrillation: Secondary | ICD-10-CM | POA: Diagnosis not present

## 2021-08-07 DIAGNOSIS — Z20822 Contact with and (suspected) exposure to covid-19: Secondary | ICD-10-CM | POA: Diagnosis not present

## 2021-08-07 DIAGNOSIS — E119 Type 2 diabetes mellitus without complications: Secondary | ICD-10-CM | POA: Diagnosis not present

## 2021-08-07 DIAGNOSIS — Z91010 Allergy to peanuts: Secondary | ICD-10-CM | POA: Diagnosis not present

## 2021-08-07 DIAGNOSIS — I10 Essential (primary) hypertension: Secondary | ICD-10-CM | POA: Insufficient documentation

## 2021-08-07 DIAGNOSIS — R109 Unspecified abdominal pain: Secondary | ICD-10-CM | POA: Diagnosis not present

## 2021-08-07 DIAGNOSIS — R079 Chest pain, unspecified: Secondary | ICD-10-CM | POA: Diagnosis not present

## 2021-08-07 DIAGNOSIS — K219 Gastro-esophageal reflux disease without esophagitis: Secondary | ICD-10-CM

## 2021-08-07 DIAGNOSIS — F4320 Adjustment disorder, unspecified: Secondary | ICD-10-CM

## 2021-08-07 DIAGNOSIS — R112 Nausea with vomiting, unspecified: Secondary | ICD-10-CM

## 2021-08-07 DIAGNOSIS — R7401 Elevation of levels of liver transaminase levels: Secondary | ICD-10-CM | POA: Diagnosis not present

## 2021-08-07 DIAGNOSIS — J9811 Atelectasis: Secondary | ICD-10-CM | POA: Diagnosis not present

## 2021-08-07 DIAGNOSIS — Z7901 Long term (current) use of anticoagulants: Secondary | ICD-10-CM | POA: Diagnosis not present

## 2021-08-07 DIAGNOSIS — K8020 Calculus of gallbladder without cholecystitis without obstruction: Secondary | ICD-10-CM | POA: Diagnosis not present

## 2021-08-07 DIAGNOSIS — Z79899 Other long term (current) drug therapy: Secondary | ICD-10-CM | POA: Insufficient documentation

## 2021-08-07 DIAGNOSIS — R600 Localized edema: Secondary | ICD-10-CM | POA: Diagnosis not present

## 2021-08-07 DIAGNOSIS — R0602 Shortness of breath: Secondary | ICD-10-CM | POA: Diagnosis not present

## 2021-08-07 DIAGNOSIS — I517 Cardiomegaly: Secondary | ICD-10-CM | POA: Diagnosis not present

## 2021-08-07 DIAGNOSIS — Z8616 Personal history of COVID-19: Secondary | ICD-10-CM | POA: Insufficient documentation

## 2021-08-07 DIAGNOSIS — K760 Fatty (change of) liver, not elsewhere classified: Secondary | ICD-10-CM | POA: Diagnosis not present

## 2021-08-07 DIAGNOSIS — R0789 Other chest pain: Secondary | ICD-10-CM | POA: Diagnosis not present

## 2021-08-07 HISTORY — DX: Type 2 diabetes mellitus without complications: E11.9

## 2021-08-07 LAB — BASIC METABOLIC PANEL
Anion gap: 11 (ref 5–15)
BUN: 7 mg/dL (ref 6–20)
CO2: 27 mmol/L (ref 22–32)
Calcium: 9.2 mg/dL (ref 8.9–10.3)
Chloride: 101 mmol/L (ref 98–111)
Creatinine, Ser: 1.1 mg/dL — ABNORMAL HIGH (ref 0.44–1.00)
GFR, Estimated: 60 mL/min — ABNORMAL LOW (ref >60)
Glucose, Bld: 138 mg/dL — ABNORMAL HIGH (ref 70–99)
Potassium: 3 mmol/L — ABNORMAL LOW (ref 3.5–5.1)
Sodium: 139 mmol/L (ref 135–145)

## 2021-08-07 LAB — CBC
HCT: 38.5 % (ref 36.0–46.0)
Hemoglobin: 13 g/dL (ref 12.0–15.0)
MCH: 27 pg (ref 26.0–34.0)
MCHC: 33.8 g/dL (ref 30.0–36.0)
MCV: 79.9 fL — ABNORMAL LOW (ref 80.0–100.0)
Platelets: 231 10*3/uL (ref 150–400)
RBC: 4.82 MIL/uL (ref 3.87–5.11)
RDW: 14.9 % (ref 11.5–15.5)
WBC: 6.5 10*3/uL (ref 4.0–10.5)
nRBC: 0 % (ref 0.0–0.2)

## 2021-08-07 LAB — TROPONIN I (HIGH SENSITIVITY)
Troponin I (High Sensitivity): 5 ng/L (ref <18)
Troponin I (High Sensitivity): 5 ng/L (ref <18)

## 2021-08-07 NOTE — ED Triage Notes (Signed)
Pt comes via [facility] for CP that started 3 days ago, PTA received one nitro and 324 ASA, with some relief of CP

## 2021-08-07 NOTE — ED Provider Triage Note (Signed)
Emergency Medicine Provider Triage Evaluation Note  Sheri Park , a 56 y.o. female  was evaluated in triage.  Pt complains of chest pain for the last 3 days.  Patient states that she had an MI January of last year.  Patient states the pain is continuous.  Review of Systems  Positive: Chest pain, shortness of breath, nausea Negative: Abdominal pain, diarrhea, fevers  Physical Exam  BP (!) 142/82 (BP Location: Left Arm)    Pulse 70    Temp 97.8 F (36.6 C) (Oral)    Resp 20    Ht 5\' 4"  (1.626 m)    Wt (!) 154.2 kg    LMP 12/15/2019    SpO2 99%    BMI 58.36 kg/m  Gen:   Awake, patient is in distressed Resp:  Normal effort. CTAB MSK:   Moves extremities without difficulty  Other:    Medical Decision Making  Medically screening exam initiated at 8:21 PM.  Appropriate orders placed.  Rada A Foxx was informed that the remainder of the evaluation will be completed by another provider, this initial triage assessment does not replace that evaluation, and the importance of remaining in the ED until their evaluation is complete.     Azucena Cecil, PA-C 08/07/21 2022

## 2021-08-08 ENCOUNTER — Emergency Department (HOSPITAL_BASED_OUTPATIENT_CLINIC_OR_DEPARTMENT_OTHER): Payer: Medicaid Other

## 2021-08-08 ENCOUNTER — Emergency Department (HOSPITAL_COMMUNITY): Payer: Medicaid Other

## 2021-08-08 ENCOUNTER — Other Ambulatory Visit: Payer: Self-pay

## 2021-08-08 ENCOUNTER — Encounter (HOSPITAL_COMMUNITY): Payer: Self-pay | Admitting: Student

## 2021-08-08 DIAGNOSIS — I517 Cardiomegaly: Secondary | ICD-10-CM | POA: Diagnosis not present

## 2021-08-08 DIAGNOSIS — K8020 Calculus of gallbladder without cholecystitis without obstruction: Secondary | ICD-10-CM | POA: Diagnosis not present

## 2021-08-08 DIAGNOSIS — I2699 Other pulmonary embolism without acute cor pulmonale: Secondary | ICD-10-CM

## 2021-08-08 DIAGNOSIS — R109 Unspecified abdominal pain: Secondary | ICD-10-CM | POA: Diagnosis not present

## 2021-08-08 DIAGNOSIS — K760 Fatty (change of) liver, not elsewhere classified: Secondary | ICD-10-CM | POA: Diagnosis not present

## 2021-08-08 DIAGNOSIS — R7401 Elevation of levels of liver transaminase levels: Secondary | ICD-10-CM | POA: Diagnosis present

## 2021-08-08 DIAGNOSIS — J9811 Atelectasis: Secondary | ICD-10-CM | POA: Diagnosis not present

## 2021-08-08 DIAGNOSIS — R748 Abnormal levels of other serum enzymes: Secondary | ICD-10-CM | POA: Diagnosis not present

## 2021-08-08 LAB — HEPATIC FUNCTION PANEL
ALT: 156 U/L — ABNORMAL HIGH (ref 0–44)
AST: 212 U/L — ABNORMAL HIGH (ref 15–41)
Albumin: 3.6 g/dL (ref 3.5–5.0)
Alkaline Phosphatase: 97 U/L (ref 38–126)
Bilirubin, Direct: 0.4 mg/dL — ABNORMAL HIGH (ref 0.0–0.2)
Indirect Bilirubin: 1.1 mg/dL — ABNORMAL HIGH (ref 0.3–0.9)
Total Bilirubin: 1.5 mg/dL — ABNORMAL HIGH (ref 0.3–1.2)
Total Protein: 7.7 g/dL (ref 6.5–8.1)

## 2021-08-08 LAB — ETHANOL: Alcohol, Ethyl (B): <10 mg/dL (ref <10)

## 2021-08-08 LAB — GLUCOSE, CAPILLARY: Glucose-Capillary: 107 mg/dL — ABNORMAL HIGH (ref 70–99)

## 2021-08-08 LAB — COMPREHENSIVE METABOLIC PANEL
ALT: 129 U/L — ABNORMAL HIGH (ref 0–44)
AST: 112 U/L — ABNORMAL HIGH (ref 15–41)
Albumin: 3.5 g/dL (ref 3.5–5.0)
Alkaline Phosphatase: 87 U/L (ref 38–126)
Anion gap: 9 (ref 5–15)
BUN: 7 mg/dL (ref 6–20)
CO2: 27 mmol/L (ref 22–32)
Calcium: 8.7 mg/dL — ABNORMAL LOW (ref 8.9–10.3)
Chloride: 103 mmol/L (ref 98–111)
Creatinine, Ser: 1.14 mg/dL — ABNORMAL HIGH (ref 0.44–1.00)
GFR, Estimated: 57 mL/min — ABNORMAL LOW (ref >60)
Glucose, Bld: 116 mg/dL — ABNORMAL HIGH (ref 70–99)
Potassium: 2.8 mmol/L — ABNORMAL LOW (ref 3.5–5.1)
Sodium: 139 mmol/L (ref 135–145)
Total Bilirubin: 1.1 mg/dL (ref 0.3–1.2)
Total Protein: 7 g/dL (ref 6.5–8.1)

## 2021-08-08 LAB — HIV ANTIBODY (ROUTINE TESTING W REFLEX): HIV Screen 4th Generation wRfx: NONREACTIVE

## 2021-08-08 LAB — RESP PANEL BY RT-PCR (FLU A&B, COVID) ARPGX2
Influenza A by PCR: NEGATIVE
Influenza B by PCR: NEGATIVE
SARS Coronavirus 2 by RT PCR: NEGATIVE

## 2021-08-08 LAB — LIPASE, BLOOD: Lipase: 28 U/L (ref 11–51)

## 2021-08-08 LAB — D-DIMER, QUANTITATIVE: D-Dimer, Quant: 0.79 ug/mL-FEU — ABNORMAL HIGH (ref 0.00–0.50)

## 2021-08-08 LAB — BRAIN NATRIURETIC PEPTIDE: B Natriuretic Peptide: 11.8 pg/mL (ref 0.0–100.0)

## 2021-08-08 MED ORDER — POTASSIUM CHLORIDE 10 MEQ/100ML IV SOLN
10.0000 meq | Freq: Once | INTRAVENOUS | Status: AC
  Administered 2021-08-08: 10 meq via INTRAVENOUS
  Filled 2021-08-08: qty 100

## 2021-08-08 MED ORDER — ALUM & MAG HYDROXIDE-SIMETH 200-200-20 MG/5ML PO SUSP
30.0000 mL | Freq: Once | ORAL | Status: AC
  Administered 2021-08-08: 30 mL via ORAL
  Filled 2021-08-08: qty 30

## 2021-08-08 MED ORDER — IOHEXOL 350 MG/ML SOLN
100.0000 mL | Freq: Once | INTRAVENOUS | Status: AC | PRN
  Administered 2021-08-08: 100 mL via INTRAVENOUS

## 2021-08-08 MED ORDER — HYDROCODONE-ACETAMINOPHEN 5-325 MG PO TABS: 1.0000 | ORAL_TABLET | Freq: Four times a day (QID) | ORAL | 0 refills | Status: DC | PRN

## 2021-08-08 MED ORDER — LIDOCAINE VISCOUS HCL 2 % MT SOLN
15.0000 mL | Freq: Once | OROMUCOSAL | Status: AC
Start: 2021-08-08 — End: 2021-08-08
  Administered 2021-08-08: 15 mL via ORAL
  Filled 2021-08-08: qty 15

## 2021-08-08 MED ORDER — PANTOPRAZOLE SODIUM 40 MG PO TBEC: 40.0000 mg | DELAYED_RELEASE_TABLET | Freq: Every day | ORAL | Status: DC

## 2021-08-08 MED ORDER — LIDOCAINE VISCOUS HCL 2 % MT SOLN
15.0000 mL | Freq: Once | OROMUCOSAL | Status: AC
  Administered 2021-08-08: 15 mL via ORAL
  Filled 2021-08-08 (×2): qty 15

## 2021-08-08 MED ORDER — ALUM & MAG HYDROXIDE-SIMETH 200-200-20 MG/5ML PO SUSP
30.0000 mL | Freq: Once | ORAL | Status: AC
Start: 2021-08-08 — End: 2021-08-08
  Administered 2021-08-08: 30 mL via ORAL
  Filled 2021-08-08: qty 30

## 2021-08-08 MED ORDER — ONDANSETRON HCL 4 MG PO TABS: 4.0000 mg | ORAL_TABLET | Freq: Four times a day (QID) | ORAL | 0 refills | Status: DC

## 2021-08-08 MED ORDER — MORPHINE SULFATE (PF) 4 MG/ML IV SOLN
4.0000 mg | Freq: Once | INTRAVENOUS | Status: AC
  Administered 2021-08-08: 4 mg via INTRAVENOUS
  Filled 2021-08-08: qty 1

## 2021-08-08 MED ORDER — HYDROCODONE-ACETAMINOPHEN 5-325 MG PO TABS
1.0000 | ORAL_TABLET | Freq: Once | ORAL | Status: AC
  Administered 2021-08-08: 1 via ORAL
  Filled 2021-08-08: qty 1

## 2021-08-08 MED ORDER — PANTOPRAZOLE SODIUM 40 MG IV SOLR
40.0000 mg | Freq: Once | INTRAVENOUS | Status: AC
  Administered 2021-08-08: 40 mg via INTRAVENOUS
  Filled 2021-08-08: qty 40

## 2021-08-08 MED ORDER — METOCLOPRAMIDE HCL 5 MG/ML IJ SOLN
10.0000 mg | Freq: Once | INTRAMUSCULAR | Status: AC
  Administered 2021-08-08: 10 mg via INTRAVENOUS
  Filled 2021-08-08: qty 2

## 2021-08-08 MED ORDER — IOHEXOL 350 MG/ML SOLN: 100.0000 mL | Freq: Once | INTRAVENOUS | Status: DC

## 2021-08-08 MED ORDER — OMEPRAZOLE 20 MG PO CPDR: 20.0000 mg | DELAYED_RELEASE_CAPSULE | Freq: Every day | ORAL | 0 refills | Status: DC

## 2021-08-08 MED ORDER — CAPSAICIN 0.075 % EX CREA
TOPICAL_CREAM | Freq: Two times a day (BID) | CUTANEOUS | Status: DC
  Filled 2021-08-08: qty 60

## 2021-08-08 MED ORDER — PANTOPRAZOLE SODIUM 40 MG PO TBEC
40.0000 mg | DELAYED_RELEASE_TABLET | Freq: Every day | ORAL | Status: DC
  Administered 2021-08-09: 40 mg via ORAL
  Filled 2021-08-08: qty 1

## 2021-08-08 MED ORDER — APIXABAN 5 MG PO TABS
5.0000 mg | ORAL_TABLET | Freq: Two times a day (BID) | ORAL | Status: DC
  Administered 2021-08-08 – 2021-08-09 (×2): 5 mg via ORAL
  Filled 2021-08-08 (×2): qty 1

## 2021-08-08 MED ORDER — ONDANSETRON HCL 4 MG/2ML IJ SOLN
4.0000 mg | Freq: Once | INTRAMUSCULAR | Status: AC
  Administered 2021-08-08: 4 mg via INTRAVENOUS
  Filled 2021-08-08: qty 2

## 2021-08-08 MED ORDER — ENOXAPARIN SODIUM 80 MG/0.8ML IJ SOSY
75.0000 mg | PREFILLED_SYRINGE | INTRAMUSCULAR | Status: DC
  Filled 2021-08-08: qty 0.75

## 2021-08-08 MED ORDER — HYDROCODONE-ACETAMINOPHEN 5-325 MG PO TABS: 1.0000 | ORAL_TABLET | Freq: Once | ORAL | Status: DC

## 2021-08-08 NOTE — Plan of Care (Signed)
  Problem: Education: Goal: Knowledge of General Education information will improve Description: Including pain rating scale, medication(s)/side effects and non-pharmacologic comfort measures Outcome: Progressing   Problem: Clinical Measurements: Goal: Ability to maintain clinical measurements within normal limits will improve Outcome: Progressing Goal: Respiratory complications will improve Outcome: Progressing Goal: Cardiovascular complication will be avoided Outcome: Progressing   Problem: Safety: Goal: Ability to remain free from injury will improve Outcome: Progressing   

## 2021-08-08 NOTE — ED Provider Notes (Addendum)
[facility] Provider Note   CSN: [id number] Arrival date & time: 08/07/21  2010     History  Chief Complaint  Patient presents with   Chest Pain    [facility] is a 56 y.o. female.  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  Patient has not had significant cough but does feel like she has been intermittently wheezing.  She last had an episode of emesis 3 or 4 hours ago and reports that it does make the chest pain and abdominal pain better but now the pain is starting to come back.  It does seem to come and go and is not exertional in nature.  She has recently increased her hydrochlorothiazide but has no other medication changes.  During her last hospitalization she was found to be in A. fib RVR and was placed on Eliquis at that time which she does continue to take now.  She has had chills but is unaware of having a fever.  She has not had any diarrhea, congestion or productive cough.  She feels that her inhalers do not really work.  The history is provided by the patient and medical records.  Chest Pain     Home Medications Prior to Admission medications   Medication Sig Start Date End Date Taking? Authorizing Provider  acetaminophen (TYLENOL) 500 MG tablet Take 2 tablets (1,000 mg total) by mouth every 8 (eight) hours as needed. Patient taking differently: Take 1,000 mg by mouth every 8 (eight) hours as needed for moderate pain or headache. 08/01/20   Zigmund Gottron, NP  albuterol (VENTOLIN HFA) 108 (90 Base) MCG/ACT inhaler Inhale 1-2 puffs into the lungs every 6 (six) hours as needed for wheezing or shortness of breath. 08/01/20   Zigmund Gottron, NP  amLODipine  (NORVASC) 5 MG tablet Take 1 tablet (5 mg total) by mouth daily. 04/07/16   Robyn Haber, MD  apixaban (ELIQUIS) 5 MG TABS tablet Take 1 tablet (5 mg total) by mouth 2 (two) times daily. 08/10/20   Charlynne Cousins, MD  blood glucose meter kit and supplies KIT Dispense based on patient and insurance preference. Use up to four times daily as directed. (FOR ICD-9 250.00, 250.01). 08/10/20   Charlynne Cousins, MD  [employer] D3 25 MCG (1000 UT) capsule Take 1,000 Units by mouth daily. 01/12/19   [provider]  cyclobenzaprine (FLEXERIL) 5 MG tablet Take 1 tablet (5 mg total) by mouth at bedtime. 08/01/20   Zigmund Gottron, NP  diltiazem (CARDIZEM CD) 240 MG 24 hr capsule Take 1 capsule (240 mg total) by mouth daily. 08/10/20   Charlynne Cousins, MD  hydrochlorothiazide (HYDRODIURIL) 12.5 MG tablet Take 12.5 mg by mouth daily.    [provider]  metFORMIN (GLUCOPHAGE) 500 MG tablet Take 1 tablet (500 mg total) by mouth 2 (two) times daily with a meal. 08/10/20 08/10/21  Charlynne Cousins, MD  metoprolol tartrate (LOPRESSOR) 25 MG tablet Take 0.5 tablets (12.5 mg total) by mouth 2 (two) times daily. 08/10/20   Charlynne Cousins, MD  OVER THE COUNTER MEDICATION Take 1 tablet by mouth daily. [place]- vitamin    [provider]  triamcinolone (KENALOG) 0.1 % Apply 1 application topically 2 (two)  times daily. 08/01/20   Zigmund Gottron, NP  vitamin B-12 (CYANOCOBALAMIN) 1000 MCG tablet Take 1,000 mcg by mouth daily.    [provider]  vitamin C (ASCORBIC ACID) 250 MG tablet Take 250 mg by mouth daily.    [provider]  Zinc Sulfate (ZINC-220 PO) Take 220 mg by mouth daily.    [provider]  ferrous sulfate 325 (65 FE) MG tablet Take 1 tablet (325 mg total) by mouth 2 (two) times daily with a meal. Patient not taking: Reported on 05/06/2019 10/21/16 12/22/19  Dixie Dials, MD  fluticasone (FLOVENT HFA) 110 MCG/ACT inhaler Inhale 1 puff into the  lungs 2 (two) times daily. Patient not taking: Reported on 05/06/2019 12/06/14 12/22/19  Dorie Rank, MD  potassium chloride (K-DUR) 10 MEQ tablet Take 10 mEq by mouth daily.  12/22/19  [provider]  promethazine (PHENERGAN) 25 MG tablet Take 1 tablet (25 mg total) by mouth every 6 (six) hours as needed for nausea or vomiting. Patient not taking: Reported on 08/20/2018 06/16/18 12/22/19  Rodriguez-Southworth, Sunday Spillers, PA-C      Allergies    Peanut-containing drug products, Shellfish allergy, Codeine, Ketorolac tromethamine, Toradol [ketorolac tromethamine], and Mucinex [guaifenesin er]    Review of Systems   Review of Systems  Cardiovascular:  Positive for chest pain.   Physical Exam Updated Vital Signs BP (!) 147/93 (BP Location: Right Arm)    Pulse 72    Temp 98.1 F (36.7 C) (Oral)    Resp 12    Ht _0  (1.626 m)    Wt (!) 154.2 kg    LMP 12/15/2019    SpO2 95%    BMI 58.36 kg/m  Physical Exam Vitals and nursing note reviewed.  Constitutional:      General: She is not in acute distress.    Appearance: She is well-developed.  HENT:     Head: Normocephalic and atraumatic.     Mouth/Throat:     Mouth: Mucous membranes are moist.  Eyes:     Conjunctiva/sclera: Conjunctivae normal.     Pupils: Pupils are equal, round, and reactive to light.  Cardiovascular:     Rate and Rhythm: Normal rate and regular rhythm.     Heart sounds: No murmur heard. Pulmonary:     Effort: Pulmonary effort is normal. No respiratory distress.     Breath sounds: Normal breath sounds. No wheezing or rales.  Chest:     Chest wall: Tenderness present.  Abdominal:     General: There is no distension.     Palpations: Abdomen is soft.     Tenderness: There is abdominal tenderness in the epigastric area. There is no guarding or rebound.  Musculoskeletal:        General: No tenderness. Normal range of motion.     Cervical back: Normal range of motion and neck supple.     Right lower leg: Edema present.      Left lower leg: Edema present.  Skin:    General: Skin is warm and dry.     Findings: No erythema or rash.  Neurological:     Mental Status: She is alert and oriented to person, place, and time. Mental status is at baseline.  Psychiatric:        Mood and Affect: Mood normal.        Behavior: Behavior normal.    ED Results / Procedures / Treatments   Labs (all labs ordered are listed, but only abnormal results are  displayed) Labs Reviewed  BASIC METABOLIC PANEL - Abnormal; Notable for the following components:      Result Value   Potassium 3.0 (*)    Glucose, Bld 138 (*)    Creatinine, Ser 1.10 (*)    GFR, Estimated 60 (*)    All other components within normal limits  CBC - Abnormal; Notable for the following components:   MCV 79.9 (*)    All other components within normal limits  HEPATIC FUNCTION PANEL - Abnormal; Notable for the following components:   AST 212 (*)    ALT 156 (*)    Total Bilirubin 1.5 (*)    Bilirubin, Direct 0.4 (*)    Indirect Bilirubin 1.1 (*)    All other components within normal limits  D-DIMER, QUANTITATIVE - Abnormal; Notable for the following components:   D-Dimer, Quant 0.79 (*)    All other components within normal limits  RESP PANEL BY RT-PCR (FLU A&B, COVID) [id number]  LIPASE, BLOOD  BRAIN NATRIURETIC PEPTIDE  TROPONIN I (HIGH SENSITIVITY)  TROPONIN I (HIGH SENSITIVITY)    EKG EKG Interpretation  Date/Time:  Wednesday August 07 2021 23:29:06 EST Ventricular Rate:  73 PR Interval:  148 QRS Duration: 84 QT Interval:  448 QTC Calculation: 493 R Axis:   26 Text Interpretation: Sinus rhythm with Premature atrial complexes Nonspecific T wave abnormality Abnormal ECG When compared with ECG of 07-Aug-2021 20:15, Premature atrial complexes are now present Confirmed by Delora Fuel ([id number]) on 08/08/2021 12:32:33 AM  Radiology DG Chest 1 View  Result Date: 08/07/2021 CLINICAL DATA:  Chest pain EXAM: CHEST  1 VIEW COMPARISON:  08/05/2020  FINDINGS: The heart size and mediastinal contours are within normal limits. Both lungs are clear. The visualized skeletal structures are unremarkable. IMPRESSION: No active disease. Electronically Signed   By: Rolm Baptise M.D.   On: 08/07/2021 21:15   CT Angio Chest PE W and/or Wo Contrast  Result Date: 08/08/2021 CLINICAL DATA:  A 56 year old female presents with positive D-dimer in suspected pulmonary embolism. EXAM: CT ANGIOGRAPHY CHEST WITH CONTRAST TECHNIQUE: Multidetector CT imaging of the chest was performed using the standard protocol during bolus administration of intravenous contrast. Multiplanar CT image reconstructions and MIPs were obtained to evaluate the vascular anatomy. RADIATION DOSE REDUCTION: This exam was performed according to the departmental dose-optimization program which includes automated exposure control, adjustment of the mA and/or kV according to patient size and/or use of iterative reconstruction technique. CONTRAST:  13m OMNIPAQUE IOHEXOL 350 MG/ML SOLN COMPARISON:  Comparison is made with 08/05/2020. FINDINGS: Cardiovascular: Main pulmonary artery remains enlarged at 36 mm. Maximal opacification of main pulmonary artery is 296 Hounsfield units, improved when compared to previous imaging. Central area of low attenuation in RIGHT upper lobe pulmonary arterial branch (image 136 and 137 of series 7 this is nonocclusive and slightly irregular. There is beam hardening artifact in the adjacent SVC due to dense contrast in this location. At the same level in the contralateral chest there is subtle low-attenuation in LEFT upper lobe branches though not as focal and not as central. No large central embolus is present. No additional areas of concern are noted on the current scan. Peripheral branches throughout the chest are limited with respect to assessment beyond the segmental level. Moderate cardiomegaly.  Aortic caliber is normal. Mediastinum/Nodes: Mildly patulous esophagus. No  adenopathy in the chest. Lungs/Pleura: Basilar atelectasis. No effusion. No consolidation. No pneumothorax. Upper Abdomen: Incidental imaging of upper abdominal contents is unremarkable. Musculoskeletal: No acute bone finding. No  destructive bone process. Spinal degenerative changes. Review of the MIP images confirms the above findings. IMPRESSION: 1. Possible tiny nonocclusive pulmonary embolus in a segmental branch of the RIGHT upper lobe. Note that this is in an area where there is abundant streak artifact from dense contrast in the SVC leading to artifact in other areas. This appears different and more central within the vessel than artifact in other locations. Consider Doppler evaluation to assess for site of potential thrombus to help guide further management. 2. Cardiomegaly with signs of pulmonary arterial hypertension that are similar to prior imaging. 3. Mildly patulous esophagus. Aortic Atherosclerosis (ICD10-I70.0). These results were called by telephone at the time of interpretation on 08/08/2021 at 12:23 pm to provider [facility] , who verbally acknowledged these results. Electronically Signed   By: Zetta Bills M.D.   On: 08/08/2021 12:26   VAS [place] LOWER EXTREMITY VENOUS (DVT) (7a-7p)  Result Date: 08/08/2021  Lower Venous DVT Study Patient Name:  KESHIA [facility]  Date of Exam:   08/08/2021 Medical Rec #: [id number]            Accession #:    [id number] Date of Birth: May 20, 1966           Patient Gender: F Patient Age:   23 years Exam Location:  [facility] Procedure:      VAS [place] LOWER EXTREMITY VENOUS (DVT) Referring Phys: Blanchie Dessert --------------------------------------------------------------------------------  Indications: Question PE on CT, patient endorses swelling and tenderness of lower extremities.  Limitations: Body habitus and poor ultrasound/tissue interface. Comparison Study: 08-05-2020 Prior bilateral lower extremity venous was limited,                   but  negative for DVT. Performing Technologist: Darlin Coco RDMS, RVT  Examination Guidelines: A complete evaluation includes B-mode imaging, spectral Doppler, color Doppler, and power Doppler as needed of all accessible portions of each vessel. Bilateral testing is considered an integral part of a complete examination. Limited examinations for reoccurring indications may be performed as noted. The reflux portion of the exam is performed with the patient in reverse Trendelenburg.  +---------+---------------+---------+-----------+----------+--------------+  RIGHT     Compressibility Phasicity Spontaneity Properties Thrombus Aging  +---------+---------------+---------+-----------+----------+--------------+  CFV       Full            Yes       Yes                                    +---------+---------------+---------+-----------+----------+--------------+  SFJ       Full                                                             +---------+---------------+---------+-----------+----------+--------------+  FV Prox   Full                                                             +---------+---------------+---------+-----------+----------+--------------+  FV Mid    Full  Yes       Yes                                    +---------+---------------+---------+-----------+----------+--------------+  FV Distal Full            Yes       Yes                                    +---------+---------------+---------+-----------+----------+--------------+  PFV       Full                                                             +---------+---------------+---------+-----------+----------+--------------+  POP       Full            Yes       Yes                                    +---------+---------------+---------+-----------+----------+--------------+  PTV       Full                                                             +---------+---------------+---------+-----------+----------+--------------+  PERO      Full                                                              +---------+---------------+---------+-----------+----------+--------------+   +---------+---------------+---------+-----------+----------+---------------+  LEFT      Compressibility Phasicity Spontaneity Properties Thrombus Aging   +---------+---------------+---------+-----------+----------+---------------+  CFV       Full            Yes       Yes                                     +---------+---------------+---------+-----------+----------+---------------+  SFJ       Full                                                              +---------+---------------+---------+-----------+----------+---------------+  FV Prox   Full                                                              +---------+---------------+---------+-----------+----------+---------------+  FV Mid    Full                                                              +---------+---------------+---------+-----------+----------+---------------+  FV Distal Full                                                              +---------+---------------+---------+-----------+----------+---------------+  PFV       Full                                                              +---------+---------------+---------+-----------+----------+---------------+  POP       Full            Yes       Yes                                     +---------+---------------+---------+-----------+----------+---------------+  PTV                                                        Patent by color  +---------+---------------+---------+-----------+----------+---------------+  PERO                                                       Patent by color  +---------+---------------+---------+-----------+----------+---------------+    Summary: RIGHT: - There is no evidence of deep vein thrombosis in the lower extremity.  - No cystic structure found in the popliteal fossa.  LEFT: - There is no evidence of deep vein thrombosis in  the lower extremity. However, portions of this examination were limited- see technologist comments above.  - No cystic structure found in the popliteal fossa.  *See table(s) above for measurements and observations.    Preliminary    US Abdomen Limited RUQ (LIVER/GB)  Result Date: 08/08/2021 CLINICAL DATA:  Transaminitis EXAM: ULTRASOUND ABDOMEN LIMITED RIGHT UPPER QUADRANT COMPARISON:  None. FINDINGS: Gallbladder: Gallstones are present, largest measures 6 mm. No gallbladder wall thickening. No sonographic Murphy sign noted by sonographer. Common bile duct: Diameter: 3 mm Liver: No focal lesion identified. Increased parenchymal echogenicity. Portal vein is patent on color Doppler imaging with normal direction of blood flow towards the liver. Other: None. IMPRESSION: 1. Cholelithiasis with no evidence of acute cholecystitis. 2. Hepatic steatosis. Electronically Signed   By: Yetta Glassman M.D.   On: 08/08/2021 13:33    Procedures Procedures    Medications Ordered in ED Medications  iohexol (OMNIPAQUE) 350 MG/ML injection 100 mL (has no administration in  time range)  alum & mag hydroxide-simeth (MAALOX/MYLANTA) 200-200-20 MG/5ML suspension 30 mL (30 mLs Oral Given 08/08/21 0900)    And  lidocaine (XYLOCAINE) 2 % viscous mouth solution 15 mL (15 mLs Oral Given 08/08/21 0900)  morphine 4 MG/ML injection 4 mg (4 mg Intravenous Given 08/08/21 0902)  ondansetron (ZOFRAN) injection 4 mg (4 mg Intravenous Given 08/08/21 0901)  potassium chloride 10 mEq in 100 mL IVPB (0 mEq Intravenous Stopped 08/08/21 1149)  iohexol (OMNIPAQUE) 350 MG/ML injection 100 mL (100 mLs Intravenous Contrast Given 08/08/21 1140)  HYDROcodone-acetaminophen (NORCO/VICODIN) 5-325 MG per tablet 1 tablet (1 tablet Oral Given 08/08/21 1426)    ED Course/ Medical Decision Making/ A&P                           Medical Decision Making Amount and/or Complexity of Data Reviewed External Data Reviewed: notes. Labs: ordered.  Decision-making details documented in ED Course. Radiology: ordered and independent interpretation performed. Decision-making details documented in ED Course. ECG/medicine tests: ordered and independent interpretation performed. Decision-making details documented in ED Course.  Risk OTC drugs. Prescription drug management.   Patient is a 56 year old female presenting today with nonspecific chest and abdominal pain with intermittent vomiting.  This has been present for the last 3 days.  Patient does have a history of pericarditis, myocarditis approximately 1 year ago but did not have evidence of MI and has never had stenting.  She did have a clean catheterization with normal arteries in 2018 but does have multiple risk factors.  Patient symptoms are not exertional and seem less likely to be cardiac in nature.  However patient is low risk Wells criteria and can use a D-dimer.  Also will check for COVID.  Last time patient had COVID was in October of last year.  I independently evaluated patient's labs today and her EKG and chest x-ray.  Chest x-ray without acute findings.  Labs with mild hypokalemia of 3.0, creatinine of 1.1, CBC with normal white count and platelets.  Hemoglobin is normal at 13.  Troponin is normal at 5x2.  Reviewed patient's external medical medical records from her last hospitalization.  Vital signs are normal and reassuring.  Concern for possible GI etiology versus PE versus infectious etiology.  Patient has no right upper quadrant pain today suggestive of gallbladder pathology.  Low suspicion for obstruction.  Patient given GI cocktail and pain and nausea medication.  Will replace potassium.  2:37 PM Patient's D-dimer is elevated today at 0.79 as well as a new transaminitis with AST of 200 and ALT of 120 patient does not use any alcohol products.  T bili is slightly elevated at 1.5.  COVID is negative.  Given her nonspecific abdominal, chest discomfort possibility this could be  cholelithiasis.  We will get a CTA to rule out PE and a right upper quadrant ultrasound to evaluate the transaminitis.  On repeat evaluation patient does report feeling better after GI cocktail and pain control.  2:37 PM Patient's CTA per radiology showed possible tiny nonocclusive PE in a segmental right upper lobe branch however they also feel that it is highly likely that this could be artifact.  Patient does have cardiomegaly and signs of pulmonary arterial hypertension but it is unchanged from a year ago.  Bilateral Dopplers are negative for DVT and patient was confirmed to still be taking Eliquis 5 mg twice daily and has not missed doses.  At this time lower suspicion for PE.  Right upper quadrant ultrasound does show cholelithiasis but no evidence of cholecystitis today.  On repeat evaluation patient reports the pain is starting to come back but she wishes to eat and drink.  Will p.o. challenge and if she passes feel that she can be discharged home.  She will need close follow-up with PCP but low suspicion at this time for acute cardiac pathology.  Suspect this is GI in nature possibly even viral etiology.  Considered admission but at this time patient does not meet criteria.  She has no social determinants affecting her care today.  3:18 PM Patient was able to tolerate p.o.'s but is still having some nausea.  Will discharge home with nausea and pain medication as well as a PPI.  Findings were discussed with the patient.  She will follow-up with Dr. Doylene Canard.  Pt given return precautions  3:24 PM Pt now reporting she would like to stay because symptoms are not better.  Will get CT abd to ensure no obstruction but at this time no criteria for admission.        Final Clinical Impression(s) / ED Diagnoses Final diagnoses:  Transaminitis  Nonspecific chest pain  Nausea and vomiting, unspecified vomiting type    Rx / DC Orders ED Discharge Orders          Ordered     HYDROcodone-acetaminophen (NORCO/VICODIN) 5-325 MG tablet  Every 6 hours PRN        08/08/21 1522    ondansetron (ZOFRAN) 4 MG tablet  Every 6 hours        08/08/21 1522    omeprazole (PRILOSEC) 20 MG capsule  Daily        08/08/21 1522              Blanchie Dessert, MD 08/08/21 2440    Blanchie Dessert, MD 08/08/21 1525

## 2021-08-08 NOTE — ED Notes (Signed)
Transport requested

## 2021-08-08 NOTE — ED Provider Notes (Signed)
56 yo female here with chest pain and epigastric pain. On 5 mg eliquis BID for A Fib  2018 cath with unremarkable coronary arteries CT PE with possible small PE vs artifact, however negative DVT ultrasounds makes PE less likely, and patient is appropriately anticoagulated already.  RUQ ultrasound with gallstones, CBD 3 mm  LFT's minor elevated, last check was 1 year ago  Delta troponins normal  Pending CT to evaluate for obstruction  Patient has not achieved pain control and nausea control, still vomiting  CT abdomen with no acute findings to explain patient's symptoms.  At this time the patient was admitted for pain control to [facility] service.   Wyvonnia Dusky, MD 08/09/21 1026

## 2021-08-08 NOTE — ED Notes (Signed)
Pt ambulatory to bathroom

## 2021-08-08 NOTE — Discharge Instructions (Addendum)
All of your lab work today with your heart was normal without any signs of heart attack.  Your x-ray did not show any signs of pneumonia.  You do have some gallstones but it does not appear to be causing any problems.  Your pain you are experiencing today may be related to acid in your stomach and esophagus.  Please return if you are having fevers, severe abdominal pain that is uncontrolled +vomiting/not tolerating food. Please follow up with your PCP closely

## 2021-08-08 NOTE — ED Notes (Signed)
Pt is still in CT at this time

## 2021-08-08 NOTE — H&P (Signed)
Family Medicine Teaching Service °Hospital Admission History and Physical °Service Pager: [phone] ° °Patient name: Sheri Park Medical record number: [id number] °Date of birth: 03/21/1966 Age: 56 y.o. Gender: female ° °Primary Care Provider: Kadakia, Ajay, MD °Consultants: None °Code Status:  °Preferred Emergency Contact: Patricia Briggs [phone], Michael [phone] ° °Chief Complaint: Epigastric pain ° ° °Assessment and Plan: °Sheri Park is a 56 y.o. female presenting with epigastric  pain. PMH is significant for A. 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 ° °Epigastric pain °Patient being admitted for epigastric pain (initially had nonspecific chest pain and abdominal pain in the ED). Vital signs have remained stable, patient has been afebrile, pulses have been 50s to 60s and mostly normotensive saturating well on room air.  Received extensive work-up in the ED.  Labs in the ED lipase of 28, AST/ALT elevated at 212/156 disease described in problem below), D-dimer elevated at 0.79, normal BMP at 11.8, respiratory panel, troponins that were 5>5, CBC within normal limits and BMP with hypokalemia and mild increase in creatinine to 1.1.  Imaging in the ED showed normal chest x-ray, CTA chest with possible tiny nonocclusive PE in segmental branch of right upper lobe likely artifact with cardiomegaly monitor previous imaging, abdominal ultrasound with no acute cholecystitis but cholelithiasis and hepatic steatosis, CT abdomen with no acute abdominal or pelvic pathology and small hiatal hernia.  EKG showed normal sinus rhythm with no acute ST elevations/changes.  Did receive Maalox/Mylanta, Norco 5x1, Reglan 10 mg injection, morphine 4 mg X1, Zofran 4 mg x1, Protonix. On examination patient appeared comfortable and was alert and responsive to all questions.  There was she described as a sore feeling when palpating her upper epigastric region.  Says that the pain is  sometimes positional and "feels like something is sitting there after she eats it."  Says that sometimes she has an acid feeling in her mouth.  Differential includes musculoskeletal pain as patient says she may have bumped this area after carrying heavy boxes during the week.  Other differentials include GERD/reflux given patient has had acidic taste in mouth and feels like food has been sitting there. Patient does have "patulous upper esophageal sphincter" on CT finding which could be contributing to her symptoms.  Other differentials could include hyperemesis cannabinoid syndrome although patient denies any drug use.  No concern for bleed on abdomen exam/imaging as patient is on Eliquis 5 mg twice daily at home.  Unlikely to be related to ACS given negative troponins and EKG, follows with cardiology and was curbsided in the ED and will follow up with them outpatient. CTA showed small nonocclusive possible PE likely artifact given patient had no dyspnea or hypoxia and was having normal pulse periods.  °-Admit to FPTS for observation, MedSurg, attending Dr. Hensel °-Vitals per floor protocol °-Keep oxygen saturations greater than 92% °-Protonix  °-Avoid narcotics °-capsaicin cream °-consider GI cocktail if worsening °-antiemetics prn if requested °-UDS °-cardiac monitoring °-AM CMP, CBC ° °Hypokalemia °3.0 yesterday and was 1x 10 meq K IV.  °-CMP °-replete as necessary ° °Transaminitis °212/156 for AST/ALT today.  Has not had this before and denies any alcohol use. Hepatic steatosis on abdominal ultrasound. °-Monitor on CMP °-Avoid hepatotoxic agents °-Hepatitis panel ° °A. fib °Heart rates have been 50s to 60s and was in sinus rhythm when in the room.  Home medication of eliquis 5 mg BID, cardizem 240 mg daily, and metoprolol 12.5 mg BID.  °-Restart home medications after medication reconciliation °-cardiac monitoring °-  Home eliquis 5 mg BID  T2DM Home medications of metformin 500 mg BID. Last A1c 6.6  08/08/20. -monitor CBGs  HTN Blood pressure ranges have been 112-151/63-107.  Home medications of amlodipine 5 mg, metoprolol 12.5 mg daily, hydrochlorothiazide 12.5 mg daily -Monitor blood pressures -Await medication reconciliation, restart as appropriate  Asthma Home medication of albuterol as needed -Restart home medication as appropriate  FEN/GI: Carb modified diet Prophylaxis: Eliquis  Disposition: Med Surg  History of Present Illness:  Sheri Park is a 56 y.o. female presenting with chest and epigastric pain for 4 days. Worsening over time associated with emesis. It looks like food contents and yellow. She denies hematemesis and hematochezia. Attempted to eat and drink but has been unable to keep anything down.   Taking hot showers makes it better. Laying on her left side makes it worse.   Last colonoscopy 10-15 years ago. States it was normal. Hx of GERD with pregnancy. Denies hx of ASA, NSAID, good powder use.   Said her mom has cancer and is currently going through chemotherapy and active treatment.  Currently lives with her mom and son.  Says her husband passed away about a year ago.  Has not been in the ED since last year 2022.  Denies alcohol, tobacco, illicit drug use.   Review Of Systems: Per HPI with the following additions:   Review of Systems  Constitutional:  Positive for chills. Negative for fever.  HENT:  Negative for congestion, rhinorrhea and sore throat.   Eyes:  Negative for visual disturbance.  Cardiovascular:  Positive for chest pain.  Gastrointestinal:  Positive for abdominal pain, nausea and vomiting. Negative for constipation and diarrhea.  Genitourinary:  Negative for difficulty urinating, hematuria and vaginal bleeding.  Musculoskeletal:  Positive for joint swelling.  Neurological:  Positive for headaches.    Patient Active Problem List   Diagnosis Date Noted   Sepsis ([place]) 08/05/2020   Atrial fibrillation with rapid ventricular response  ([place]) 08/05/2020   Chest pain 08/05/2020   Urticaria 08/05/2020   AKI (acute kidney injury) ([place]) 08/05/2020   Acute coronary syndrome ([place]) 10/18/2016   Chest pain on exertion 10/17/2016    Past Medical History: Past Medical History:  Diagnosis Date   Asthma    Diabetes mellitus without complication ([place])    Hypertension     Past Surgical History: Past Surgical History:  Procedure Laterality Date   KNEE SURGERY     LEFT HEART CATH AND CORONARY ANGIOGRAPHY N/A 10/20/2016   Procedure: Left Heart Cath and Coronary Angiography;  Surgeon: Dixie Dials, MD;  Location: [facility];  Service: Cardiovascular;  Laterality: N/A;   TUBAL LIGATION      Social History: Social History   Tobacco Use   Smoking status: Never   Smokeless tobacco: Never  Vaping Use   Vaping Use: Never used  Substance Use Topics   Alcohol use: No   Drug use: No   Additional social history:  per HPI Please also refer to relevant sections of EMR.  Family History: Family History  Problem Relation Age of Onset   Diabetes Mother    Cancer Mother    Breast cancer Mother 87   Breast cancer Maternal Aunt     Allergies and Medications: Allergies  Allergen Reactions   Peanut-Containing Drug Products Anaphylaxis    Oil, nuts, anything peanut related   Shellfish Allergy Anaphylaxis   Codeine Itching   Ketorolac Tromethamine Swelling   Toradol [Ketorolac Tromethamine] Swelling  Mucinex [Guaifenesin Er] Rash   No current facility-administered medications on file prior to encounter.   Current Outpatient Medications on File Prior to Encounter  Medication Sig Dispense Refill   acetaminophen (TYLENOL) 500 MG tablet Take 2 tablets (1,000 mg total) by mouth every 8 (eight) hours as needed. (Patient taking differently: Take 1,000 mg by mouth every 8 (eight) hours as needed for moderate pain or headache.) 30 tablet 0   albuterol (VENTOLIN HFA) 108 (90 Base) MCG/ACT inhaler Inhale 1-2 puffs into the lungs  every 6 (six) hours as needed for wheezing or shortness of breath. 8 g 0   amLODipine (NORVASC) 5 MG tablet Take 1 tablet (5 mg total) by mouth daily. 90 tablet 3   apixaban (ELIQUIS) 5 MG TABS tablet Take 1 tablet (5 mg total) by mouth 2 (two) times daily. 60 tablet 3   blood glucose meter kit and supplies KIT Dispense based on patient and insurance preference. Use up to four times daily as directed. (FOR ICD-9 250.00, 250.01). 1 each 0   [employer] D3 25 MCG (1000 UT) capsule Take 1,000 Units by mouth daily.     cyclobenzaprine (FLEXERIL) 5 MG tablet Take 1 tablet (5 mg total) by mouth at bedtime. 15 tablet 0   diltiazem (CARDIZEM CD) 240 MG 24 hr capsule Take 1 capsule (240 mg total) by mouth daily. 30 capsule 3   hydrochlorothiazide (HYDRODIURIL) 12.5 MG tablet Take 12.5 mg by mouth daily.     metFORMIN (GLUCOPHAGE) 500 MG tablet Take 1 tablet (500 mg total) by mouth 2 (two) times daily with a meal. 60 tablet 11   metoprolol tartrate (LOPRESSOR) 25 MG tablet Take 0.5 tablets (12.5 mg total) by mouth 2 (two) times daily. 60 tablet 3   OVER THE COUNTER MEDICATION Take 1 tablet by mouth daily. [place]- vitamin     triamcinolone (KENALOG) 0.1 % Apply 1 application topically 2 (two) times daily. 30 g 0   vitamin B-12 (CYANOCOBALAMIN) 1000 MCG tablet Take 1,000 mcg by mouth daily.     vitamin C (ASCORBIC ACID) 250 MG tablet Take 250 mg by mouth daily.     Zinc Sulfate (ZINC-220 PO) Take 220 mg by mouth daily.     [DISCONTINUED] ferrous sulfate 325 (65 FE) MG tablet Take 1 tablet (325 mg total) by mouth 2 (two) times daily with a meal. (Patient not taking: Reported on 05/06/2019) 60 tablet 3   [DISCONTINUED] fluticasone (FLOVENT HFA) 110 MCG/ACT inhaler Inhale 1 puff into the lungs 2 (two) times daily. (Patient not taking: Reported on 05/06/2019) 1 Inhaler 12   [DISCONTINUED] potassium chloride (K-DUR) 10 MEQ tablet Take 10 mEq by mouth daily.     [DISCONTINUED] promethazine (PHENERGAN) 25 MG tablet Take 1  tablet (25 mg total) by mouth every 6 (six) hours as needed for nausea or vomiting. (Patient not taking: Reported on 08/20/2018) 30 tablet 0    Objective: BP 112/63    Pulse 78    Temp 98.1 F (36.7 C) (Oral)    Resp 15    Ht 5' 4" (1.626 m)    Wt (!) 154.2 kg    LMP 12/15/2019    SpO2 96%    BMI 58.36 kg/m  Exam: General: NAD, laying in bed asleep, alert and responsive to questions Eyes: Tracks movements, erythema or scleral icterus ENTM: No nasal drainage, normocephalic atraumatic Neck: ROM intact Cardiovascular: RRR no m/r/g, 2+ pulses, brisk capillary refill Respiratory: CTAB no w/r/c, no iWOB Gastrointestinal: Mildly tender to palpation  in epigastric region, no masses, soft, no rebound tenderness °MSK: Trace swelling bilaterally, no calf swelling,  °Derm: No rashes or lesions °Neuro: No focal deficits °Psych: Tired, pleasant ° °Labs and Imaging: °CBC BMET  °Recent Labs  °Lab 08/07/21 °2025  °WBC 6.5  °HGB 13.0  °HCT 38.5  °PLT 231  ° Recent Labs  °Lab 08/07/21 °2025  °NA 139  °K 3.0*  °CL 101  °CO2 27  °BUN 7  °CREATININE 1.10*  °GLUCOSE 138*  °CALCIUM 9.2  °  ° °EKG: sinus rhythm no ST elevations ° ° °Jagadish, Mayuri, MD °08/08/2021, 5:34 PM °PGY-1,  Family Medicine °FPTS Intern pager: [phone], text pages welcome  °

## 2021-08-08 NOTE — Progress Notes (Signed)
Lower extremity venous bilateral study completed.  Preliminary results relayed to Maryan Rued, MD.  See CV Proc for preliminary results report.   Darlin Coco, RDMS, RVT

## 2021-08-08 NOTE — Progress Notes (Addendum)
FPTS Brief Progress Note  S: Went to patient's room surrounding patient after giving page from nurse about patient having abdominal pain.  Patient reported pain started 0508 ago and has previously improved in the ED with medication.  She describes her pain as a sharp throbbing pain in the epigastric area with feeling of heartburn and bitter taste.  She endorses nausea but denies vomiting, chest pains or palpitations.  Last bowel movement was yesterday.  On exam patient has abdominal tenderness on the epigastric area.   O: BP 121/71    Pulse 74    Temp 98.1 F (36.7 C) (Oral)    Resp 16    Ht 5\' 4"  (1.626 m)    Wt (!) 151 kg    LMP 12/15/2019    SpO2 95%    BMI 57.14 kg/m    General:Awake, well appearing,  CV: RRR, no murmurs, normal S1/S2 Pulm: CTAB, good WOB on RA Abd: Soft, no distension, positive epigastric tenderness Ext: No BLE edema,   A/P: Epigastric pain Patient reported sharp epigastric pain with feeling of heart burn and bitter taste which is suspicious of GERD however other ethologies are considered.  On exam had positive epigastric tenderness on palpation.  Will order GI cocktail-if no improvement consider reordering Norco pain control. -Ordered GI cocktail -Continue plan per day team - Orders reviewed. Labs for AM ordered, which was adjusted as needed.    Alen Bleacher, MD 08/08/2021, 9:24 PM PGY-1, [place] Night Resident  Please page [phone] with questions.

## 2021-08-09 DIAGNOSIS — K219 Gastro-esophageal reflux disease without esophagitis: Secondary | ICD-10-CM

## 2021-08-09 DIAGNOSIS — F4320 Adjustment disorder, unspecified: Secondary | ICD-10-CM

## 2021-08-09 DIAGNOSIS — R0789 Other chest pain: Secondary | ICD-10-CM

## 2021-08-09 DIAGNOSIS — R7401 Elevation of levels of liver transaminase levels: Secondary | ICD-10-CM | POA: Diagnosis not present

## 2021-08-09 DIAGNOSIS — R112 Nausea with vomiting, unspecified: Secondary | ICD-10-CM

## 2021-08-09 LAB — CBC
HCT: 36.3 % (ref 36.0–46.0)
Hemoglobin: 11.7 g/dL — ABNORMAL LOW (ref 12.0–15.0)
MCH: 26.5 pg (ref 26.0–34.0)
MCHC: 32.2 g/dL (ref 30.0–36.0)
MCV: 82.1 fL (ref 80.0–100.0)
Platelets: 202 10*3/uL (ref 150–400)
RBC: 4.42 MIL/uL (ref 3.87–5.11)
RDW: 15.1 % (ref 11.5–15.5)
WBC: 3.9 10*3/uL — ABNORMAL LOW (ref 4.0–10.5)
nRBC: 0 % (ref 0.0–0.2)

## 2021-08-09 LAB — HEPATITIS PANEL, ACUTE
HCV Ab: NONREACTIVE
Hep A IgM: NONREACTIVE
Hep B C IgM: NONREACTIVE
Hepatitis B Surface Ag: NONREACTIVE

## 2021-08-09 LAB — COMPREHENSIVE METABOLIC PANEL
ALT: 121 U/L — ABNORMAL HIGH (ref 0–44)
AST: 85 U/L — ABNORMAL HIGH (ref 15–41)
Albumin: 3.3 g/dL — ABNORMAL LOW (ref 3.5–5.0)
Alkaline Phosphatase: 83 U/L (ref 38–126)
Anion gap: 8 (ref 5–15)
BUN: 5 mg/dL — ABNORMAL LOW (ref 6–20)
CO2: 29 mmol/L (ref 22–32)
Calcium: 8.7 mg/dL — ABNORMAL LOW (ref 8.9–10.3)
Chloride: 101 mmol/L (ref 98–111)
Creatinine, Ser: 1.06 mg/dL — ABNORMAL HIGH (ref 0.44–1.00)
GFR, Estimated: >60 mL/min (ref >60)
Glucose, Bld: 94 mg/dL (ref 70–99)
Potassium: 3.1 mmol/L — ABNORMAL LOW (ref 3.5–5.1)
Sodium: 138 mmol/L (ref 135–145)
Total Bilirubin: 0.9 mg/dL (ref 0.3–1.2)
Total Protein: 6.5 g/dL (ref 6.5–8.1)

## 2021-08-09 LAB — GLUCOSE, CAPILLARY
Glucose-Capillary: 100 mg/dL — ABNORMAL HIGH (ref 70–99)
Glucose-Capillary: 103 mg/dL — ABNORMAL HIGH (ref 70–99)
Glucose-Capillary: 108 mg/dL — ABNORMAL HIGH (ref 70–99)
Glucose-Capillary: 91 mg/dL (ref 70–99)

## 2021-08-09 LAB — HEMOGLOBIN A1C
Hgb A1c MFr Bld: 5.9 % — ABNORMAL HIGH (ref 4.8–5.6)
Mean Plasma Glucose: 122.63 mg/dL

## 2021-08-09 MED ORDER — HYDROCODONE-ACETAMINOPHEN 5-325 MG PO TABS
1.0000 | ORAL_TABLET | Freq: Once | ORAL | Status: AC
  Administered 2021-08-09: 1 via ORAL
  Filled 2021-08-09: qty 1

## 2021-08-09 MED ORDER — POTASSIUM CHLORIDE CRYS ER 20 MEQ PO TBCR
40.0000 meq | EXTENDED_RELEASE_TABLET | Freq: Once | ORAL | Status: AC
  Administered 2021-08-09: 40 meq via ORAL
  Filled 2021-08-09: qty 2

## 2021-08-09 NOTE — Progress Notes (Signed)
Patient desatting to mid 80s during sleep. 2L nasal cannula placed with saturations increasing to low to mid 90s.  Patient still continues to have oxygen drops to the mid 80s and quickly increase to the mid 90s.

## 2021-08-09 NOTE — Discharge Summary (Signed)
[facility] Discharge Summary  Patient name: Sheri Park Medical record number: [id number] Date of birth: [birth year] Age: 56 y.o. Gender: female Date of Admission: 08/07/2021  Date of Discharge: 08/09/2021 Admitting Physician: Gerrit Heck, MD  Primary Care Provider: Dixie Dials, MD Consultants: None  Indication for Hospitalization: Abdominal Pain + Transaminitis  Discharge Diagnoses/Problem List:  Transaminitis Nausea and vomiting GERD Adjustment disorder  Disposition: Home   Discharge Condition: Stable  Discharge Exam:  General: NAD, awake, alert, responsive to questions Head: Normocephalic atraumatic CV: Regular rate and rhythm no murmurs rubs or gallops Respiratory: Clear to ausculation bilaterally, no wheezes rales or crackles, chest rises symmetrically,  no increased work of breathing Abdomen: Soft, non-tender, non-distended, normoactive bowel sounds  Extremities: Moves upper and lower extremities freely, no edema in LE Neuro: No focal deficits Skin: No rashes or lesions visualized   Brief Hospital Course:   Epigastric pain Patient being admitted for epigastric pain (initially had nonspecific chest pain and abdominal pain in the ED).  Labs in the ED lipase of 28, AST/ALT elevated at 212/156 disease described in problem below), D-dimer elevated at 0.79, normal BMP at 11.8, respiratory panel, troponins that were 5>5, CBC within normal limits and BMP with hypokalemia and mild increase in creatinine to 1.1.  Imaging in the ED showed normal chest x-ray, CTA chest with possible tiny nonocclusive PE in segmental branch of right upper lobe likely artifact with cardiomegaly monitor previous imaging, abdominal ultrasound with no acute cholecystitis but cholelithiasis and hepatic steatosis, CT abdomen with no acute abdominal or pelvic pathology and small hiatal hernia.  Likely combination of musculoskeletal pain as patient says she may have  bumped this area after carrying heavy boxes during the week.  Other possibilities include GERD/reflux given patient has had acidic taste in mouth and feels like food has been sitting there. Did also have cholelithiasis could be biliary cholic pain with elevated LFTs that came down.  Transaminitis 212/156 which improved to 85/121 at end of hospitalization. Has not had this before and denies any alcohol use. Hepatic steatosis on abdominal ultrasound with cholelithiasis. Could be secondary to biliary colic episode. Hepatitis panel is pending at discharge.   Nocturnal hypoxia Denies any shortness of breath but was put on 2 L oxygen for desaturations to the 80s.  Says that she does at times feel like she chokes at night and wakes up feeling like she can not breathe.  Consider outpatient sleep study   Chronic conditions remained stable   Issues for Follow Up:  Outpatient sleep study for sleep apnea symptoms Consider outpatient referral for cholecystectomy given possible biliary colic Discuss avoiding fatty diet Recommend counseling for some adjustment reaction from husband death and social situation currently (see H&P for more information)  Significant Procedures: None  Significant Labs and Imaging:  Recent Labs  Lab 08/07/21 2025 08/09/21 0257  WBC 6.5 3.9*  HGB 13.0 11.7*  HCT 38.5 36.3  PLT 231 202   Recent Labs  Lab 08/07/21 2025 08/08/21 0848 08/08/21 1815 08/09/21 0257  NA 139  --  139 138  K 3.0*  --  2.8* 3.1*  CL 101  --  103 101  CO2 27  --  27 29  GLUCOSE 138*  --  116* 94  BUN 7  --  7 5*  CREATININE 1.10*  --  1.14* 1.06*  CALCIUM 9.2  --  8.7* 8.7*  ALKPHOS  --  97 87 83  AST  --  212*  112* 85*  ALT  --  156* 129* 121*  ALBUMIN  --  3.6 3.5 3.3*     Results/Tests Pending at Time of Discharge:   Discharge Medications:  Allergies as of 08/09/2021       Reactions   Other Anaphylaxis, Other (See Comments)   ALLERGIC TO ALL NUTS!!!!   Peanut-containing  Drug Products Anaphylaxis, Other (See Comments)   Oil, nuts, anything peanut-related   Shellfish Allergy Anaphylaxis   Tape Rash, Other (See Comments)   NO PAPER TAPE!!!   Codeine Itching   Ketorolac Tromethamine Swelling   Toradol [ketorolac Tromethamine] Swelling   Latex Rash, Other (See Comments)   Causes skin blisters   Mucinex [guaifenesin Er] Rash        Medication List     STOP taking these medications    acetaminophen 500 MG tablet Commonly known as: TYLENOL   amLODipine 5 MG tablet Commonly known as: NORVASC   diltiazem 240 MG 24 hr capsule Commonly known as: CARDIZEM CD   hydrochlorothiazide 12.5 MG capsule Commonly known as: MICROZIDE   metFORMIN 500 MG tablet Commonly known as: Glucophage   triamcinolone cream 0.1 % Commonly known as: KENALOG       TAKE these medications    albuterol 108 (90 Base) MCG/ACT inhaler Commonly known as: VENTOLIN HFA Inhale 1-2 puffs into the lungs every 6 (six) hours as needed for wheezing or shortness of breath.   apixaban 5 MG Tabs tablet Commonly known as: ELIQUIS Take 1 tablet (5 mg total) by mouth 2 (two) times daily.   blood glucose meter kit and supplies Kit Dispense based on patient and insurance preference. Use up to four times daily as directed. (FOR ICD-9 250.00, 250.01).   cyclobenzaprine 5 MG tablet Commonly known as: FLEXERIL Take 1 tablet (5 mg total) by mouth at bedtime. What changed:  when to take this reasons to take this   metoprolol tartrate 25 MG tablet Commonly known as: LOPRESSOR Take 0.5 tablets (12.5 mg total) by mouth 2 (two) times daily.   omeprazole 20 MG capsule Commonly known as: PRILOSEC Take 1 capsule (20 mg total) by mouth daily.   ondansetron 4 MG tablet Commonly known as: ZOFRAN Take 1 tablet (4 mg total) by mouth every 6 (six) hours.   One-A-Day Womens Formula Tabs Take 1 tablet by mouth daily with breakfast.        Discharge Instructions: Please refer to  Patient Instructions section of EMR for full details.  Patient was counseled important signs and symptoms that should prompt return to medical care, changes in medications, dietary instructions, activity restrictions, and follow up appointments.   Follow-Up Appointments:  Follow-up Information     Dixie Dials, MD. Go on 08/14/2021.   Specialty: Cardiology Why: call today or monday for recheck.'@3' :00pm Contact information: [place] [address] [phone]                 Gerrit Heck, MD 08/09/2021, 12:20 PM PGY-1, [place]

## 2021-08-09 NOTE — Hospital Course (Addendum)
°  Epigastric pain Patient being admitted for epigastric pain (initially had nonspecific chest pain and abdominal pain in the ED).  Labs in the ED lipase of 28, AST/ALT elevated at 212/156 disease described in problem below), D-dimer elevated at 0.79, normal BMP at 11.8, respiratory panel, troponins that were 5>5, CBC within normal limits and BMP with hypokalemia and mild increase in creatinine to 1.1.  Imaging in the ED showed normal chest x-ray, CTA chest with possible tiny nonocclusive PE in segmental branch of right upper lobe likely artifact with cardiomegaly monitor previous imaging, abdominal ultrasound with no acute cholecystitis but cholelithiasis and hepatic steatosis, CT abdomen with no acute abdominal or pelvic pathology and small hiatal hernia.  Likely combination of musculoskeletal pain as patient says she may have bumped this area after carrying heavy boxes during the week.  Other possibilities include GERD/reflux given patient has had acidic taste in mouth and feels like food has been sitting there. Did also have cholelithiasis could be biliary cholic pain with elevated LFTs that came down.  Transaminitis 212/156 which improved to 85/121 at end of hospitalization. Has not had this before and denies any alcohol use. Hepatic steatosis on abdominal ultrasound with cholelithiasis. Could be secondary to biliary colic episode. Hepatitis panel is pending at discharge.   Nocturnal hypoxia Denies any shortness of breath but was put on 2 L oxygen for desaturations to the 80s.  Says that she does at times feel like she chokes at night and wakes up feeling like she can not breathe.  Consider outpatient sleep study   Chronic conditions remained stable

## 2021-08-09 NOTE — Progress Notes (Signed)
Pt states that she has some right upper abdominal swelling. MD made aware. Pt has orders to be discharged. Discharge instructions given and pt has no additional questions at this time. Medication regimen reviewed and pt educated. Pt verbalized understanding and has no additional questions. Telemetry box removed. IV removed and site in good condition. Pt stable and waiting for transportation.

## 2021-08-09 NOTE — TOC Progression Note (Addendum)
Transition of Care [facility]) - Progression Note    Patient Details  Name: Sheri Park MRN: [id number] Date of Birth: Dec 12, 1965  [facility]) CM/SW Contact  Leone Haven, RN Phone Number: 08/09/2021, 11:27 AM  Clinical Narrative:    Patient is from home with her mom and kids, she has $4 copay for medications, she has transport home at discharge.  Patient states she has some swelling in her abd and staff RN will let MD know.  TOC will continue to follow . Will need ambulatory sats checked also.        Expected Discharge Plan and Services           Expected Discharge Date: 08/09/21                                     Social Determinants of Health (SDOH) Interventions    Readmission Risk Interventions No flowsheet data found.

## 2021-08-09 NOTE — TOC Transition Note (Signed)
Transition of Care [facility]) - CM/SW Discharge Note   Patient Details  Name: SAPIR LAVEY MRN: [id number] Date of Birth: [birth year]  Transition of Care [facility]) CM/SW Contact:  Leone Haven, RN Phone Number: 08/09/2021, 12:24 PM   Clinical Narrative:    Patient is from home with her mom and kids, she has $4 copay for medications, she has transport home at discharge.  Patient states she has some swelling in her abd and staff RN will let MD know.  Per MD they will look at it outpatient.  Per ambulatory sats she does not need oxygen.            Patient Goals and CMS Choice        Discharge Placement                       Discharge Plan and Services                                     Social Determinants of Health (SDOH) Interventions     Readmission Risk Interventions No flowsheet data found.

## 2021-08-09 NOTE — Progress Notes (Signed)
Mobility Specialist Progress Note:   08/09/21 1150  Mobility  Activity Ambulated with assistance in hallway  Level of Assistance Independent  Assistive Device None  Distance Ambulated (ft) 250 ft  Activity Response Tolerated well  $Mobility charge 1 Mobility   SATURATION QUALIFICATIONS: (This note is used to comply with regulatory documentation for home oxygen)  Patient Saturations on Room Air at Rest = 96%  Patient Saturations on Room Air while Ambulating = 92%  Patient Saturations on n/a Liters of oxygen while Ambulating = n/a%  Pt received EOB willing to participate in mobility. No complaints of pain and asymptomatic. Pt left EOB with call bell in reach and all needs met.   [facility] Public librarian Phone [phone] Secondary Phone [phone]

## 2021-08-09 NOTE — Progress Notes (Incomplete)
Family Medicine Teaching Service Daily Progress Note Intern Pager: [phone]  Patient name: Sheri Park Medical record number: [id number] Date of birth: 1965/11/12 Age: 56 y.o. Gender: female  Primary Care Provider: Dixie Dials, MD Consultants: None Code Status: Full  Pt Overview and Major Events to Date:  1/26 admitted  Assessment and Plan:  Sheri Park is a 56 year old female who presented with epigastric pain.  Past medical history significant for A. 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  Epigastric pain Patient still has some epigastric pain but rates it as a 2 out of 10.  Was eating her breakfast this morning and feels somewhat nauseous with some acidic feeling in her mouth.  Abdomen was soft, and mildly tender to palpation of epigastric region.  Did not get capsicum cream last night.  Received Norco X1 this morning.  Received Maalox/Mylanta, lidocaine yesterday night. UDS was not collected. No vomiting overnight. Biliary cholic . Lowfat diet, outpatient gen surg. Return precautions of fevers and chills -Protonix 40 mg daily -Capsicum cream twice daily  Nocturnal hypoxia Denies any shortness of breath but was put on 2 L oxygen for desaturations to the 80s.  Says that she does at times feel like she chokes at night and wakes up feeling like she can not breathe.  Denies any shortness of breath currently and denies any chest pain.  Saturating on room air and clear to auscultation bilaterally. -Consider outpatient sleep study  Hypokalemia K was 3.1 this morning.  Does have home medication of hydrochlorothiazide***. Says she had no vomiting overnight -Replete 40 mequivalent this morning  Transaminitis, improving AST/ALT is 85/121 from 112/129. Some hepatic steatosis on ultrasound.  Hepatitis panel pending -Follow-up hepatitis panel -Monitor CMP  T2DM A1c 5.9. Home medication of metformin 500 mg BID (not taking). -Monitor CBGs  HTN Blood  pressure ranges have been 147/52-101.  Medications of amlodipine, metoprolol, hydrochlorothiazide. -Hold home medications while blood pressure soft  A fib  Home medication of eliquis 5 mg BID and metoprolol 12.5 mg BID. -cardiac monitoring -eliquis 5 mg BID  FEN/GI: Carb modified PPx: Eliquis 5 mg BID Dispo: Home likely today  Subjective:  ***  Objective: Temp:  [98.5 F (36.9 C)-98.7 F (37.1 C)] 98.7 F (37.1 C) (01/27 0332) Pulse Rate:  [55-78] 71 (01/27 0332) Resp:  [12-28] 18 (01/27 0332) BP: (100-151)/(52-107) 118/65 (01/27 0332) SpO2:  [89 %-100 %] 98 % (01/27 0332) Weight:  [150.5 kg-151 kg] 150.5 kg (01/27 0349) Physical Exam: General: *** Cardiovascular: *** Respiratory: *** Abdomen: *** Extremities: ***  Laboratory: Recent Labs  Lab 08/07/21 2025 08/09/21 0257  WBC 6.5 3.9*  HGB 13.0 11.7*  HCT 38.5 36.3  PLT 231 202   Recent Labs  Lab 08/07/21 2025 08/08/21 0848 08/08/21 1815 08/09/21 0257  NA 139  --  139 138  K 3.0*  --  2.8* 3.1*  CL 101  --  103 101  CO2 27  --  27 29  BUN 7  --  7 5*  CREATININE 1.10*  --  1.14* 1.06*  CALCIUM 9.2  --  8.7* 8.7*  PROT  --  7.7 7.0 6.5  BILITOT  --  1.5* 1.1 0.9  ALKPHOS  --  97 87 83  ALT  --  156* 129* 121*  AST  --  212* 112* 85*  GLUCOSE 138*  --  116* 94    ***  Imaging/Diagnostic Tests: Gerrit Heck, MD 08/09/2021, 8:18 AM PGY-***, [place] [facility]  Intern pager: [phone], text pages welcome

## 2021-08-12 ENCOUNTER — Telehealth: Payer: Self-pay

## 2021-08-12 NOTE — Telephone Encounter (Signed)
Transition Care Management Unsuccessful Follow-up Telephone Call  Date of discharge and from where:  08/09/2021-[place]   Attempts:  1st Attempt  Reason for unsuccessful TCM follow-up call:  Unable to reach patient

## 2021-08-14 NOTE — Telephone Encounter (Signed)
Transition Care Management Unsuccessful Follow-up Telephone Call  Date of discharge and from where:  08/09/2021-[facility]   Attempts:  2nd Attempt  Reason for unsuccessful TCM follow-up call:  Unable to reach patient

## 2021-08-15 NOTE — Telephone Encounter (Signed)
Transition Care Management Unsuccessful Follow-up Telephone Call  Date of discharge and from where:  08/09/2021-[place]   Attempts:  3rd Attempt  Reason for unsuccessful TCM follow-up call:  Unable to reach patient

## 2022-02-24 ENCOUNTER — Ambulatory Visit (HOSPITAL_COMMUNITY): Payer: Medicaid Other

## 2022-02-26 DIAGNOSIS — I480 Paroxysmal atrial fibrillation: Secondary | ICD-10-CM | POA: Diagnosis not present

## 2022-02-26 DIAGNOSIS — R072 Precordial pain: Secondary | ICD-10-CM | POA: Diagnosis not present

## 2022-02-26 DIAGNOSIS — I10 Essential (primary) hypertension: Secondary | ICD-10-CM | POA: Diagnosis not present

## 2022-06-07 ENCOUNTER — Encounter (HOSPITAL_COMMUNITY): Payer: Self-pay | Admitting: Emergency Medicine

## 2022-06-07 ENCOUNTER — Emergency Department (HOSPITAL_COMMUNITY): Payer: Medicaid Other

## 2022-06-07 ENCOUNTER — Other Ambulatory Visit: Payer: Self-pay

## 2022-06-07 ENCOUNTER — Emergency Department (HOSPITAL_COMMUNITY)
Admission: EM | Admit: 2022-06-07 | Discharge: 2022-06-07 | Disposition: A | Discharge status: Home / Self Care | Payer: Medicaid Other | Attending: Emergency Medicine | Admitting: Emergency Medicine

## 2022-06-07 DIAGNOSIS — E119 Type 2 diabetes mellitus without complications: Secondary | ICD-10-CM | POA: Diagnosis not present

## 2022-06-07 DIAGNOSIS — R0789 Other chest pain: Secondary | ICD-10-CM

## 2022-06-07 DIAGNOSIS — Z91040 Latex allergy status: Secondary | ICD-10-CM | POA: Diagnosis not present

## 2022-06-07 DIAGNOSIS — R079 Chest pain, unspecified: Secondary | ICD-10-CM

## 2022-06-07 DIAGNOSIS — R0602 Shortness of breath: Secondary | ICD-10-CM | POA: Insufficient documentation

## 2022-06-07 DIAGNOSIS — Z7901 Long term (current) use of anticoagulants: Secondary | ICD-10-CM | POA: Diagnosis not present

## 2022-06-07 DIAGNOSIS — R110 Nausea: Secondary | ICD-10-CM | POA: Insufficient documentation

## 2022-06-07 DIAGNOSIS — I10 Essential (primary) hypertension: Secondary | ICD-10-CM | POA: Insufficient documentation

## 2022-06-07 DIAGNOSIS — Z79899 Other long term (current) drug therapy: Secondary | ICD-10-CM | POA: Diagnosis not present

## 2022-06-07 DIAGNOSIS — M7989 Other specified soft tissue disorders: Secondary | ICD-10-CM | POA: Diagnosis not present

## 2022-06-07 DIAGNOSIS — Z7951 Long term (current) use of inhaled steroids: Secondary | ICD-10-CM | POA: Diagnosis not present

## 2022-06-07 DIAGNOSIS — Z91010 Allergy to peanuts: Secondary | ICD-10-CM | POA: Diagnosis not present

## 2022-06-07 DIAGNOSIS — J45909 Unspecified asthma, uncomplicated: Secondary | ICD-10-CM | POA: Diagnosis not present

## 2022-06-07 DIAGNOSIS — R002 Palpitations: Secondary | ICD-10-CM | POA: Insufficient documentation

## 2022-06-07 LAB — CBC
HCT: 37.9 % (ref 36.0–46.0)
Hemoglobin: 12.2 g/dL (ref 12.0–15.0)
MCH: 27.4 pg (ref 26.0–34.0)
MCHC: 32.2 g/dL (ref 30.0–36.0)
MCV: 85 fL (ref 80.0–100.0)
Platelets: 258 10*3/uL (ref 150–400)
RBC: 4.46 MIL/uL (ref 3.87–5.11)
RDW: 13.7 % (ref 11.5–15.5)
WBC: 5.2 10*3/uL (ref 4.0–10.5)
nRBC: 0 % (ref 0.0–0.2)

## 2022-06-07 LAB — TROPONIN I (HIGH SENSITIVITY)
Troponin I (High Sensitivity): 6 ng/L (ref <18)
Troponin I (High Sensitivity): 8 ng/L (ref <18)

## 2022-06-07 LAB — BASIC METABOLIC PANEL
Anion gap: 11 (ref 5–15)
BUN: 11 mg/dL (ref 6–20)
CO2: 25 mmol/L (ref 22–32)
Calcium: 9.1 mg/dL (ref 8.9–10.3)
Chloride: 104 mmol/L (ref 98–111)
Creatinine, Ser: 1.04 mg/dL — ABNORMAL HIGH (ref 0.44–1.00)
GFR, Estimated: >60 mL/min (ref >60)
Glucose, Bld: 96 mg/dL (ref 70–99)
Potassium: 3.8 mmol/L (ref 3.5–5.1)
Sodium: 140 mmol/L (ref 135–145)

## 2022-06-07 LAB — I-STAT BETA HCG BLOOD, ED (MC, WL, AP ONLY): I-stat hCG, quantitative: <5 m[IU]/mL (ref <5)

## 2022-06-07 LAB — BRAIN NATRIURETIC PEPTIDE: B Natriuretic Peptide: 50.6 pg/mL (ref 0.0–100.0)

## 2022-06-07 MED ORDER — LIDOCAINE 5 % EX PTCH
1.0000 | MEDICATED_PATCH | CUTANEOUS | Status: DC
Start: 2022-06-07 — End: 2022-06-07
  Administered 2022-06-07: 1 via TRANSDERMAL
  Filled 2022-06-07: qty 1

## 2022-06-07 NOTE — ED Provider Notes (Signed)
[facility] EMERGENCY DEPARTMENT Provider Note   CSN: [id number] Arrival date & time: 06/07/22  1207     History  Chief Complaint  Patient presents with   Chest Pain    Sheri Park is a 56 y.o. female. With past medical history of asthma, hypertension, diabetes, MI in 2020 who presents to the emergency department with chest pain.  States states that last night around 11 PM she was laying in bed when she had left-sided chest pressure.  She states that it was a heaviness that radiated into her left arm.  She states that she could not get comfortable laying down to she got up and took an aspirin which did not help.  States that she then took a hot shower which also did not help.  States she was up all night sitting up in bed because she could not get comfortable laying down.  States that symptoms persisted today so she decided to be evaluated.  She had associated palpitations and nausea.  She denies having shortness of breath.  Additionally she has noticed some lower extremity swelling over the past 1 month.  She denies dizziness or syncope.  She denies fever and cough.     Chest Pain Associated symptoms: nausea, palpitations and shortness of breath        Home Medications Prior to Admission medications   Medication Sig Start Date End Date Taking? Authorizing Provider  albuterol (VENTOLIN HFA) 108 (90 Base) MCG/ACT inhaler Inhale 1-2 puffs into the lungs every 6 (six) hours as needed for wheezing or shortness of breath. 08/01/20   Zigmund Gottron, NP  apixaban (ELIQUIS) 5 MG TABS tablet Take 1 tablet (5 mg total) by mouth 2 (two) times daily. 08/10/20   Charlynne Cousins, MD  blood glucose meter kit and supplies KIT Dispense based on patient and insurance preference. Use up to four times daily as directed. (FOR ICD-9 250.00, 250.01). 08/10/20   Charlynne Cousins, MD  cyclobenzaprine (FLEXERIL) 5 MG tablet Take 1 tablet (5 mg total) by mouth at  bedtime. Patient taking differently: Take 5 mg by mouth at bedtime as needed for muscle spasms. 08/01/20   Zigmund Gottron, NP  metoprolol tartrate (LOPRESSOR) 25 MG tablet Take 0.5 tablets (12.5 mg total) by mouth 2 (two) times daily. 08/10/20   Charlynne Cousins, MD  Multiple Vitamins-Calcium (ONE-A-DAY WOMENS FORMULA) TABS Take 1 tablet by mouth daily with breakfast.    [provider]  omeprazole (PRILOSEC) 20 MG capsule Take 1 capsule (20 mg total) by mouth daily. 08/08/21   Blanchie Dessert, MD  ondansetron (ZOFRAN) 4 MG tablet Take 1 tablet (4 mg total) by mouth every 6 (six) hours. 08/08/21   Blanchie Dessert, MD  ferrous sulfate 325 (65 FE) MG tablet Take 1 tablet (325 mg total) by mouth 2 (two) times daily with a meal. Patient not taking: Reported on 05/06/2019 10/21/16 12/22/19  Dixie Dials, MD  fluticasone (FLOVENT HFA) 110 MCG/ACT inhaler Inhale 1 puff into the lungs 2 (two) times daily. Patient not taking: Reported on 05/06/2019 12/06/14 12/22/19  Dorie Rank, MD  potassium chloride (K-DUR) 10 MEQ tablet Take 10 mEq by mouth daily.  12/22/19  [provider]  promethazine (PHENERGAN) 25 MG tablet Take 1 tablet (25 mg total) by mouth every 6 (six) hours as needed for nausea or vomiting. Patient not taking: Reported on 08/20/2018 06/16/18 12/22/19  Rodriguez-Southworth, Sunday Spillers, PA-C      Allergies    Other,  Peanut-containing drug products, Shellfish allergy, Tape, Codeine, Ketorolac tromethamine, Toradol [ketorolac tromethamine], Latex, and Mucinex [guaifenesin er]    Review of Systems   Review of Systems  Respiratory:  Positive for shortness of breath.   Cardiovascular:  Positive for chest pain, palpitations and leg swelling.  Gastrointestinal:  Positive for nausea.  Neurological:  Negative for syncope and light-headedness.  All other systems reviewed and are negative.   Physical Exam Updated Vital Signs BP (!) 158/106   Pulse 64   Temp 97.7 F (36.5 C)    Resp 18   LMP 12/15/2019   SpO2 100%  Physical Exam Vitals and nursing note reviewed.  Constitutional:      General: She is not in acute distress.    Appearance: Normal appearance. She is well-developed. She is obese. She is not ill-appearing.  HENT:     Head: Normocephalic.  Eyes:     General: No scleral icterus.    Pupils: Pupils are equal, round, and reactive to light.  Cardiovascular:     Rate and Rhythm: Normal rate and regular rhythm.     Pulses:          Radial pulses are 2+ on the right side and 2+ on the left side.     Heart sounds: Normal heart sounds. No murmur heard. Pulmonary:     Effort: Pulmonary effort is normal. No tachypnea or respiratory distress.     Breath sounds: Decreased breath sounds present.  Abdominal:     General: Bowel sounds are normal.     Palpations: Abdomen is soft.  Musculoskeletal:     Right lower leg: Edema present.     Left lower leg: Edema present.  Skin:    General: Skin is warm and dry.     Capillary Refill: Capillary refill takes less than 2 seconds.  Neurological:     General: No focal deficit present.     Mental Status: She is alert and oriented to person, place, and time.  Psychiatric:        Mood and Affect: Mood normal.        Behavior: Behavior normal.     ED Results / Procedures / Treatments   Labs (all labs ordered are listed, but only abnormal results are displayed) Labs Reviewed  BASIC METABOLIC PANEL - Abnormal; Notable for the following components:      Result Value   Creatinine, Ser 1.04 (*)    All other components within normal limits  CBC  BRAIN NATRIURETIC PEPTIDE  I-STAT BETA HCG BLOOD, ED ([facility], [facility], [facility] ONLY)  TROPONIN I (HIGH SENSITIVITY)  TROPONIN I (HIGH SENSITIVITY)   EKG None  Radiology DG Chest 2 View  Result Date: 06/07/2022 CLINICAL DATA:  Chest pain EXAM: CHEST - 2 VIEW COMPARISON:  Chest x-ray dated August 07, 2021 FINDINGS: The heart size and mediastinal contours are within normal limits.  Both lungs are clear. The visualized skeletal structures are unremarkable. IMPRESSION: No active cardiopulmonary disease. Electronically Signed   By: Yetta Glassman M.D.   On: 06/07/2022 14:05    Procedures Procedures   Medications Ordered in ED Medications - No data to display  ED Course/ Medical Decision Making/ A&P                           Medical Decision Making Amount and/or Complexity of Data Reviewed Labs: ordered. Radiology: ordered.  Care of patient handed off to Suella Broad, PA-C. Patient will need delta  troponin, BNP. Likely needs consult to cardiology after delta and BNP back. Appreciate cardiology recs and dispo based on their recommendation. Please see her note for completion of care.  Final Clinical Impression(s) / ED Diagnoses Final diagnoses:  None    Rx / DC Orders ED Discharge Orders     None         Mickie Hillier, PA-C 06/07/22 1533    Malvin Johns, MD 06/10/22 1016

## 2022-06-07 NOTE — ED Notes (Signed)
Pt sleeping. 

## 2022-06-07 NOTE — ED Triage Notes (Signed)
Patient here POV w/ left sided chest pain that started at 1130 pm last night w/ pain radiating down to left arm w/ left hand numbness. Patient states the pain comes and goes,feeling tight. Patient states the numbness comes and goes with the chest pain as well. Patient w/ hx of MI in 2020 stating she didn't even know she had one, it was when she was sick w/ COVID. Patient denies [facility], n/v/d.

## 2022-06-07 NOTE — ED Provider Notes (Signed)
56 yo female with CP, onset last PM. No relief with ASA and hot shower.  Sees Dr. Algie Coffer. Cath 2018 normal. Pitting edema lower extremities. Denies SHOB. Pending repeat trop and BNP.  Echo 07/2020 with EF 65-70% ? MI in 2020, elevate trops, had COVID, ?myocarditis  Physical Exam  BP (!) 158/106   Pulse 64   Temp 97.7 F (36.5 C)   Resp 18   LMP 12/15/2019   SpO2 100%   Physical Exam  Procedures  Procedures  ED Course / MDM    Medical Decision Making Amount and/or Complexity of Data Reviewed Labs: ordered. Radiology: ordered.  Risk Prescription drug management.   Troponin unchanged, BNP negative.  Discussed results with patient who reports she has had return of her left-sided chest discomfort.  Pain is notably reproduced with palpation left side of her chest.  No identifiable skin lesions at this time.  Plan is to apply Lidoderm patch.  Case was discussed with Dr. Earle Gell, ER attending, agrees with plan of care.  Patient to follow-up with her cardiologist with return precautions provided.       Jeannie Fend, PA-C 06/07/22 1803    Glendora Score, MD 06/08/22 [phone]

## 2022-06-07 NOTE — Discharge Instructions (Signed)
Topical pain reliever applied today, this is available as an over-the-counter lidocaine patch.  Please follow-up with your cardiologist. Return to ER for worsening or concerning symptoms.

## 2022-07-04 ENCOUNTER — Ambulatory Visit (INDEPENDENT_AMBULATORY_CARE_PROVIDER_SITE_OTHER): Payer: Medicaid Other

## 2022-07-04 ENCOUNTER — Encounter (HOSPITAL_COMMUNITY): Payer: Self-pay | Admitting: Emergency Medicine

## 2022-07-04 ENCOUNTER — Ambulatory Visit (HOSPITAL_COMMUNITY)
Admission: EM | Admit: 2022-07-04 | Discharge: 2022-07-04 | Disposition: A | Discharge status: Home / Self Care | Payer: Medicaid Other | Attending: Physician Assistant | Admitting: Physician Assistant

## 2022-07-04 DIAGNOSIS — Z1152 Encounter for screening for COVID-19: Secondary | ICD-10-CM | POA: Insufficient documentation

## 2022-07-04 DIAGNOSIS — J069 Acute upper respiratory infection, unspecified: Secondary | ICD-10-CM

## 2022-07-04 DIAGNOSIS — J209 Acute bronchitis, unspecified: Secondary | ICD-10-CM | POA: Diagnosis not present

## 2022-07-04 DIAGNOSIS — R059 Cough, unspecified: Secondary | ICD-10-CM | POA: Diagnosis not present

## 2022-07-04 DIAGNOSIS — R112 Nausea with vomiting, unspecified: Secondary | ICD-10-CM | POA: Diagnosis not present

## 2022-07-04 DIAGNOSIS — R509 Fever, unspecified: Secondary | ICD-10-CM | POA: Diagnosis present

## 2022-07-04 DIAGNOSIS — R0602 Shortness of breath: Secondary | ICD-10-CM

## 2022-07-04 LAB — POC INFLUENZA A AND B ANTIGEN (URGENT CARE ONLY)
INFLUENZA A ANTIGEN, POC: NEGATIVE
INFLUENZA B ANTIGEN, POC: NEGATIVE

## 2022-07-04 LAB — CBG MONITORING, ED: Glucose-Capillary: 115 mg/dL — ABNORMAL HIGH (ref 70–99)

## 2022-07-04 MED ORDER — ONDANSETRON HCL 4 MG PO TABS: 4.0000 mg | ORAL_TABLET | Freq: Three times a day (TID) | ORAL | 0 refills | Status: DC | PRN

## 2022-07-04 MED ORDER — ONDANSETRON 4 MG PO TBDP
ORAL_TABLET | ORAL | Status: AC
  Filled 2022-07-04: qty 1

## 2022-07-04 MED ORDER — ONDANSETRON 4 MG PO TBDP
4.0000 mg | ORAL_TABLET | Freq: Once | ORAL | Status: AC
  Administered 2022-07-04: 4 mg via ORAL

## 2022-07-04 NOTE — ED Triage Notes (Addendum)
Cough, body aches, headache, eye pain, fever x 3 days. Taking safetussin OTC with minimal improvement. Hx of diabetes, states her sugars have been running high, and that she can't keep down food/fluids from vomiting over the last two days. No diarrhea. Last took 1 extra strength tylenol around 0500 this morning.

## 2022-07-04 NOTE — ED Provider Notes (Signed)
Sheri Park    CSN: [id number] Arrival date & time: 07/04/22  2671      History   Chief Complaint Chief Complaint  Patient presents with   Cough   Generalized Body Aches    HPI [name] is a 56 y.o. female.   56 year old female presents with cough, congestion, fatigue, nausea with vomiting.  Patient indicates for the past 4 days she has been having increasing upper respiratory congestion with rhinitis and postnasal drip which is mainly been clear.  Patient also indicates she is having recurrent chest congestion with coughing exacerbations which is making her short of breath.  Patient indicates that the production is clear.  Patient also indicates that she is having extreme fatigue, lethargy, chills, body aches and pain along with soreness.  Patient indicates that she feels exhausted and tired.  Patient also indicates that she is having nausea with several episodes of vomiting over the past 2 days.  Patient indicates that she is a diabetic and she has been controlling her glucose through diet.  Patient indicates the last glucose reading was a couple days ago and it was 150.  Patient indicates that she is taking some Safe Tussin for the cough and congestion.  Patient does indicate that she did have a flu vaccine this season.  Patient is concerned that she may have COVID or flu.   Cough Associated symptoms: chills and shortness of breath     Past Medical History:  Diagnosis Date   Asthma    Diabetes mellitus without complication ([place])    Hypertension     Patient Active Problem List   Diagnosis Date Noted   Nausea and vomiting    Gastroesophageal reflux disease    Adjustment disorder    Transaminitis 08/08/2021   Sepsis ([place]) 08/05/2020   Atrial fibrillation with rapid ventricular response ([place]) 08/05/2020   Chest pain 08/05/2020   Urticaria 08/05/2020   AKI (acute kidney injury) ([place]) 08/05/2020   Acute coronary syndrome ([place]) 10/18/2016   Chest pain  on exertion 10/17/2016    Past Surgical History:  Procedure Laterality Date   KNEE SURGERY     LEFT HEART CATH AND CORONARY ANGIOGRAPHY N/A 10/20/2016   Procedure: Left Heart Cath and Coronary Angiography;  Surgeon: Dixie Dials, MD;  Location: [facility];  Service: Cardiovascular;  Laterality: N/A;   TUBAL LIGATION      OB History   No obstetric history on file.      Home Medications    Prior to Admission medications   Medication Sig Start Date End Date Taking? Authorizing Provider  ondansetron (ZOFRAN) 4 MG tablet Take 1 tablet (4 mg total) by mouth every 8 (eight) hours as needed for nausea or vomiting. 07/04/22  Yes Nyoka Lint, PA-C  albuterol (VENTOLIN HFA) 108 (90 Base) MCG/ACT inhaler Inhale 1-2 puffs into the lungs every 6 (six) hours as needed for wheezing or shortness of breath. 08/01/20   Zigmund Gottron, NP  apixaban (ELIQUIS) 5 MG TABS tablet Take 1 tablet (5 mg total) by mouth 2 (two) times daily. 08/10/20   Charlynne Cousins, MD  blood glucose meter kit and supplies KIT Dispense based on patient and insurance preference. Use up to four times daily as directed. (FOR ICD-9 250.00, 250.01). 08/10/20   Charlynne Cousins, MD  cyclobenzaprine (FLEXERIL) 5 MG tablet Take 1 tablet (5 mg total) by mouth at bedtime. Patient taking differently: Take 5 mg by mouth at bedtime as needed for  muscle spasms. 08/01/20   Zigmund Gottron, NP  metoprolol tartrate (LOPRESSOR) 25 MG tablet Take 0.5 tablets (12.5 mg total) by mouth 2 (two) times daily. 08/10/20   Charlynne Cousins, MD  Multiple Vitamins-Calcium (ONE-A-DAY WOMENS FORMULA) TABS Take 1 tablet by mouth daily with breakfast.    [provider]  omeprazole (PRILOSEC) 20 MG capsule Take 1 capsule (20 mg total) by mouth daily. 08/08/21   Blanchie Dessert, MD  ondansetron (ZOFRAN) 4 MG tablet Take 1 tablet (4 mg total) by mouth every 6 (six) hours. 08/08/21   Blanchie Dessert, MD  ferrous sulfate 325 (65 FE) MG  tablet Take 1 tablet (325 mg total) by mouth 2 (two) times daily with a meal. Patient not taking: Reported on 05/06/2019 10/21/16 12/22/19  Dixie Dials, MD  fluticasone (FLOVENT HFA) 110 MCG/ACT inhaler Inhale 1 puff into the lungs 2 (two) times daily. Patient not taking: Reported on 05/06/2019 12/06/14 12/22/19  Dorie Rank, MD  potassium chloride (K-DUR) 10 MEQ tablet Take 10 mEq by mouth daily.  12/22/19  [provider]  promethazine (PHENERGAN) 25 MG tablet Take 1 tablet (25 mg total) by mouth every 6 (six) hours as needed for nausea or vomiting. Patient not taking: Reported on 08/20/2018 06/16/18 12/22/19  Rodriguez-Southworth, Sunday Spillers, PA-C    Family History Family History  Problem Relation Age of Onset   Diabetes Mother    Cancer Mother    Breast cancer Mother 36   Breast cancer Maternal Aunt     Social History Social History   Tobacco Use   Smoking status: Never   Smokeless tobacco: Never  Vaping Use   Vaping Use: Never used  Substance Use Topics   Alcohol use: No   Drug use: No     Allergies   Other, Peanut-containing drug products, Shellfish allergy, Tape, Codeine, Ketorolac tromethamine, Toradol [ketorolac tromethamine], Latex, and Mucinex [guaifenesin er]   Review of Systems Review of Systems  Constitutional:  Positive for chills and fatigue.  Respiratory:  Positive for cough and shortness of breath.   Gastrointestinal:  Positive for nausea and vomiting.     Physical Exam Triage Vital Signs ED Triage Vitals  Enc Vitals Group     BP 07/04/22 1008 (!) 165/95     Pulse Rate 07/04/22 1008 78     Resp 07/04/22 1008 20     Temp 07/04/22 1008 98.3 F (36.8 C)     Temp Source 07/04/22 1008 Oral     SpO2 07/04/22 1008 97 %     Weight --      Height --      Head Circumference --      Peak Flow --      Pain Score 07/04/22 1015 7     Pain Loc --      Pain Edu? --      Excl. in [place]? --    No data found.  Updated Vital Signs BP (!) 165/95 (BP Location:  Right Arm)   Pulse 78   Temp 98.3 F (36.8 C) (Oral)   Resp 20   LMP 12/15/2019   SpO2 97%   Visual Acuity Right Eye Distance:   Left Eye Distance:   Bilateral Distance:    Right Eye Near:   Left Eye Near:    Bilateral Near:     Physical Exam Constitutional:      Appearance: Normal appearance.  HENT:     Right Ear: Tympanic membrane and ear canal normal.  Left Ear: Tympanic membrane and ear canal normal.     Mouth/Throat:     Mouth: Mucous membranes are moist.     Pharynx: Oropharynx is clear.  Cardiovascular:     Rate and Rhythm: Normal rate and regular rhythm.     Heart sounds: Normal heart sounds.  Pulmonary:     Effort: Pulmonary effort is normal.     Breath sounds: Normal breath sounds and air entry. No wheezing, rhonchi or rales.  Lymphadenopathy:     Cervical: No cervical adenopathy.  Neurological:     Mental Status: She is alert.      UC Treatments / Results  Labs (all labs ordered are listed, but only abnormal results are displayed) Labs Reviewed  CBG MONITORING, ED - Abnormal; Notable for the following components:      Result Value   Glucose-Capillary 115 (*)    All other components within normal limits  SARS CORONAVIRUS 2 (TAT 6-24 HRS)  POC INFLUENZA A AND B ANTIGEN (URGENT CARE ONLY)    EKG   Radiology DG Chest 2 View  Result Date: 07/04/2022 CLINICAL DATA:  Four days of cough and congestion. Shortness of breath. EXAM: CHEST - 2 VIEW COMPARISON:  None Available. FINDINGS: The heart size and mediastinal contours are within normal limits. Lungs are clear without focal consolidation or pleural effusion. The visualized skeletal structures are unremarkable. IMPRESSION: No active cardiopulmonary disease. Electronically Signed   By: Keane Police D.O.   On: 07/04/2022 11:02    Procedures Procedures (including critical care time)  Medications Ordered in UC Medications  ondansetron (ZOFRAN-ODT) disintegrating tablet 4 mg (4 mg Oral Given  07/04/22 1033)    Initial Impression / Assessment and Plan / UC Course  I have reviewed the triage vital signs and the nursing notes.  Pertinent labs & imaging results that were available during my care of the patient were reviewed by me and considered in my medical decision making (see chart for details).    Plan: 1.  The upper respiratory tract infection will be treated with the following: A.  Advised to continue to take Safe Tussin for the cough and congestion. 2.  The fever will be treated with the following: A.  Advised Tylenol ibuprofen for fever and body discomfort. 3.  The nausea and vomiting be treated with the following: A.  Zofran 4 mg given in the office to control the nausea. B.  Zofran 4 mg every 6 hours to control nausea and vomiting. 4.  The acute bronchitis will be treated with the following: A.  Safe Tussin every 6 hours to control cough and congestion. 5.  Screening for COVID-19 will be treated with the following: A.  Treatment may be considered depending on the results of the COVID test. Advised follow-up PCP or return to urgent care if symptoms fail to improve. Final Clinical Impressions(s) / UC Diagnoses   Final diagnoses:  Viral upper respiratory tract infection  Fever, unspecified  Nausea and vomiting, unspecified vomiting type  Encounter for screening for COVID-19  Acute bronchitis, unspecified organism     Discharge Instructions      To be completed in 48 hours.  If you do not get a call from this office that indicates the test is negative.  Log onto MyChart to view the test results with post in 48 hours.  Advised to continue to take the Safe Tussin for the chest congestion and cough. Advised take the Zofran every 6 hours on a regular basis to  help control the nausea. Advised to increase fluid intake and take Tylenol or ibuprofen for discomfort.  Advised to follow-up PCP or to the emergency room if symptoms fail to improve over the next couple  days.     ED Prescriptions     Medication Sig Dispense Auth. Provider   ondansetron (ZOFRAN) 4 MG tablet Take 1 tablet (4 mg total) by mouth every 8 (eight) hours as needed for nausea or vomiting. 20 tablet Nyoka Lint, PA-C      PDMP not reviewed this encounter.   Nyoka Lint, PA-C 07/04/22 1113

## 2022-07-04 NOTE — Discharge Instructions (Addendum)
To be completed in 48 hours.  If you do not get a call from this office that indicates the test is negative.  Log onto MyChart to view the test results with post in 48 hours.  Advised to continue to take the Safe Tussin for the chest congestion and cough. Advised take the Zofran every 6 hours on a regular basis to help control the nausea. Advised to increase fluid intake and take Tylenol or ibuprofen for discomfort.  Advised to follow-up PCP or to the emergency room if symptoms fail to improve over the next couple days.

## 2022-07-05 LAB — SARS CORONAVIRUS 2 (TAT 6-24 HRS): SARS Coronavirus 2: NEGATIVE

## 2022-07-17 ENCOUNTER — Emergency Department (HOSPITAL_COMMUNITY): Payer: Medicaid Other

## 2022-07-17 ENCOUNTER — Emergency Department (HOSPITAL_COMMUNITY)
Admission: EM | Admit: 2022-07-17 | Discharge: 2022-07-17 | Disposition: A | Discharge status: Home / Self Care | Payer: Medicaid Other | Attending: Emergency Medicine | Admitting: Emergency Medicine

## 2022-07-17 ENCOUNTER — Encounter (HOSPITAL_COMMUNITY): Payer: Self-pay

## 2022-07-17 ENCOUNTER — Other Ambulatory Visit: Payer: Self-pay

## 2022-07-17 DIAGNOSIS — Z7901 Long term (current) use of anticoagulants: Secondary | ICD-10-CM | POA: Insufficient documentation

## 2022-07-17 DIAGNOSIS — Z7951 Long term (current) use of inhaled steroids: Secondary | ICD-10-CM | POA: Diagnosis not present

## 2022-07-17 DIAGNOSIS — B349 Viral infection, unspecified: Secondary | ICD-10-CM | POA: Insufficient documentation

## 2022-07-17 DIAGNOSIS — Z91010 Allergy to peanuts: Secondary | ICD-10-CM | POA: Diagnosis not present

## 2022-07-17 DIAGNOSIS — E119 Type 2 diabetes mellitus without complications: Secondary | ICD-10-CM | POA: Diagnosis not present

## 2022-07-17 DIAGNOSIS — Z91040 Latex allergy status: Secondary | ICD-10-CM | POA: Insufficient documentation

## 2022-07-17 DIAGNOSIS — R0789 Other chest pain: Secondary | ICD-10-CM | POA: Diagnosis not present

## 2022-07-17 DIAGNOSIS — Z79899 Other long term (current) drug therapy: Secondary | ICD-10-CM | POA: Insufficient documentation

## 2022-07-17 DIAGNOSIS — I10 Essential (primary) hypertension: Secondary | ICD-10-CM | POA: Insufficient documentation

## 2022-07-17 DIAGNOSIS — J45909 Unspecified asthma, uncomplicated: Secondary | ICD-10-CM | POA: Diagnosis not present

## 2022-07-17 DIAGNOSIS — Z1152 Encounter for screening for COVID-19: Secondary | ICD-10-CM | POA: Diagnosis not present

## 2022-07-17 DIAGNOSIS — R42 Dizziness and giddiness: Secondary | ICD-10-CM | POA: Diagnosis not present

## 2022-07-17 DIAGNOSIS — R079 Chest pain, unspecified: Secondary | ICD-10-CM | POA: Diagnosis not present

## 2022-07-17 DIAGNOSIS — R0600 Dyspnea, unspecified: Secondary | ICD-10-CM | POA: Diagnosis not present

## 2022-07-17 DIAGNOSIS — R059 Cough, unspecified: Secondary | ICD-10-CM | POA: Diagnosis not present

## 2022-07-17 LAB — CBC
HCT: 37.6 % (ref 36.0–46.0)
Hemoglobin: 12.5 g/dL (ref 12.0–15.0)
MCH: 27.2 pg (ref 26.0–34.0)
MCHC: 33.2 g/dL (ref 30.0–36.0)
MCV: 81.9 fL (ref 80.0–100.0)
Platelets: 249 10*3/uL (ref 150–400)
RBC: 4.59 MIL/uL (ref 3.87–5.11)
RDW: 13.7 % (ref 11.5–15.5)
WBC: 5.9 10*3/uL (ref 4.0–10.5)
nRBC: 0 % (ref 0.0–0.2)

## 2022-07-17 LAB — BASIC METABOLIC PANEL
Anion gap: 13 (ref 5–15)
BUN: 13 mg/dL (ref 6–20)
CO2: 24 mmol/L (ref 22–32)
Calcium: 9 mg/dL (ref 8.9–10.3)
Chloride: 101 mmol/L (ref 98–111)
Creatinine, Ser: 1.11 mg/dL — ABNORMAL HIGH (ref 0.44–1.00)
GFR, Estimated: 59 mL/min — ABNORMAL LOW (ref >60)
Glucose, Bld: 90 mg/dL (ref 70–99)
Potassium: 3.3 mmol/L — ABNORMAL LOW (ref 3.5–5.1)
Sodium: 138 mmol/L (ref 135–145)

## 2022-07-17 LAB — I-STAT BETA HCG BLOOD, ED (MC, WL, AP ONLY): I-stat hCG, quantitative: <5 m[IU]/mL (ref <5)

## 2022-07-17 LAB — CBG MONITORING, ED: Glucose-Capillary: 99 mg/dL (ref 70–99)

## 2022-07-17 LAB — RESP PANEL BY RT-PCR (RSV, FLU A&B, COVID)  RVPGX2
Influenza A by PCR: NEGATIVE
Influenza B by PCR: NEGATIVE
Resp Syncytial Virus by PCR: NEGATIVE
SARS Coronavirus 2 by RT PCR: NEGATIVE

## 2022-07-17 LAB — TROPONIN I (HIGH SENSITIVITY): Troponin I (High Sensitivity): 7 ng/L (ref <18)

## 2022-07-17 MED ORDER — SODIUM CHLORIDE 0.9 % IV BOLUS
1000.0000 mL | Freq: Once | INTRAVENOUS | Status: AC
  Administered 2022-07-17: 1000 mL via INTRAVENOUS

## 2022-07-17 MED ORDER — LIDOCAINE 5 % EX PTCH
1.0000 | MEDICATED_PATCH | CUTANEOUS | Status: DC
  Administered 2022-07-17: 1 via TRANSDERMAL
  Filled 2022-07-17: qty 1

## 2022-07-17 MED ORDER — ACETAMINOPHEN 325 MG PO TABS
650.0000 mg | ORAL_TABLET | Freq: Once | ORAL | Status: AC
  Administered 2022-07-17: 650 mg via ORAL
  Filled 2022-07-17: qty 2

## 2022-07-17 MED ORDER — HYDROCODONE-ACETAMINOPHEN 5-325 MG PO TABS: 1.0000 | ORAL_TABLET | Freq: Four times a day (QID) | ORAL | 0 refills | Status: DC | PRN

## 2022-07-17 NOTE — ED Notes (Signed)
Pt states her face and lips feel tingling and it just started now.

## 2022-07-17 NOTE — ED Triage Notes (Signed)
Pt woke up at 0430 today with dizziness. Pt went to work and vomited. Pt states she has been having midsternal chest pain that radiates to left part of chestx2d. PT's chest pan increases w/coughing.

## 2022-07-17 NOTE — Discharge Instructions (Signed)
Rest at home tomorrow.  Drink plenty of fluids.  Take your pain medicine if Tylenol does not take care of your pain.  Follow-up with your family doctor next week

## 2022-07-17 NOTE — ED Provider Notes (Signed)
[facility] EMERGENCY DEPARTMENT Provider Note   CSN: [id number] Arrival date & time: 07/17/22  1204     History  Chief Complaint  Patient presents with   Dizziness    Sheri Park is a 57 y.o. female.  Patient has a history of asthma hypertension and diabetes.  She complains of cough and weakness  The history is provided by the patient and medical records. A language interpreter was used.  Dizziness Quality:  Lightheadedness Severity:  Mild Onset quality:  Sudden Timing:  Intermittent Progression:  Waxing and waning Chronicity:  New Context: not when bending over   Relieved by:  Nothing Worsened by:  Nothing Associated symptoms: no chest pain, no diarrhea and no headaches        Home Medications Prior to Admission medications   Medication Sig Start Date End Date Taking? Authorizing Provider  HYDROcodone-acetaminophen (NORCO/VICODIN) 5-325 MG tablet Take 1 tablet by mouth every 6 (six) hours as needed for moderate pain. 07/17/22  Yes Milton Ferguson, MD  albuterol (VENTOLIN HFA) 108 (90 Base) MCG/ACT inhaler Inhale 1-2 puffs into the lungs every 6 (six) hours as needed for wheezing or shortness of breath. 08/01/20   Zigmund Gottron, NP  apixaban (ELIQUIS) 5 MG TABS tablet Take 1 tablet (5 mg total) by mouth 2 (two) times daily. 08/10/20   Charlynne Cousins, MD  blood glucose meter kit and supplies KIT Dispense based on patient and insurance preference. Use up to four times daily as directed. (FOR ICD-9 250.00, 250.01). 08/10/20   Charlynne Cousins, MD  cyclobenzaprine (FLEXERIL) 5 MG tablet Take 1 tablet (5 mg total) by mouth at bedtime. Patient taking differently: Take 5 mg by mouth at bedtime as needed for muscle spasms. 08/01/20   Zigmund Gottron, NP  metoprolol tartrate (LOPRESSOR) 25 MG tablet Take 0.5 tablets (12.5 mg total) by mouth 2 (two) times daily. 08/10/20   Charlynne Cousins, MD  Multiple Vitamins-Calcium (ONE-A-DAY WOMENS FORMULA)  TABS Take 1 tablet by mouth daily with breakfast.    [provider]  omeprazole (PRILOSEC) 20 MG capsule Take 1 capsule (20 mg total) by mouth daily. 08/08/21   Blanchie Dessert, MD  ondansetron (ZOFRAN) 4 MG tablet Take 1 tablet (4 mg total) by mouth every 6 (six) hours. 08/08/21   Blanchie Dessert, MD  ondansetron (ZOFRAN) 4 MG tablet Take 1 tablet (4 mg total) by mouth every 8 (eight) hours as needed for nausea or vomiting. 07/04/22   Nyoka Lint, PA-C  ferrous sulfate 325 (65 FE) MG tablet Take 1 tablet (325 mg total) by mouth 2 (two) times daily with a meal. Patient not taking: Reported on 05/06/2019 10/21/16 12/22/19  Dixie Dials, MD  fluticasone (FLOVENT HFA) 110 MCG/ACT inhaler Inhale 1 puff into the lungs 2 (two) times daily. Patient not taking: Reported on 05/06/2019 12/06/14 12/22/19  Dorie Rank, MD  potassium chloride (K-DUR) 10 MEQ tablet Take 10 mEq by mouth daily.  12/22/19  [provider]  promethazine (PHENERGAN) 25 MG tablet Take 1 tablet (25 mg total) by mouth every 6 (six) hours as needed for nausea or vomiting. Patient not taking: Reported on 08/20/2018 06/16/18 12/22/19  Rodriguez-Southworth, Sunday Spillers, PA-C      Allergies    Other, Peanut-containing drug products, Shellfish allergy, Tape, Codeine, Ketorolac tromethamine, Toradol [ketorolac tromethamine], Latex, and Mucinex [guaifenesin er]    Review of Systems   Review of Systems  Constitutional:  Negative for appetite change and fatigue.  HENT:  Negative for congestion, ear discharge and sinus pressure.   Eyes:  Negative for discharge.  Respiratory:  Positive for cough.   Cardiovascular:  Negative for chest pain.  Gastrointestinal:  Negative for abdominal pain and diarrhea.  Genitourinary:  Negative for frequency and hematuria.  Musculoskeletal:  Negative for back pain.  Skin:  Negative for rash.  Neurological:  Positive for dizziness. Negative for seizures and headaches.  Psychiatric/Behavioral:   Negative for hallucinations.     Physical Exam Updated Vital Signs BP (!) 155/88   Pulse 74   Temp 97.8 F (36.6 C) (Oral)   Resp (!) 21   Ht _0  (1.626 m)   Wt (!) 150.5 kg   LMP 12/15/2019   SpO2 98%   BMI 56.95 kg/m  Physical Exam Vitals and nursing note reviewed.  Constitutional:      Appearance: She is well-developed.  HENT:     Head: Normocephalic.     Nose: Nose normal.  Eyes:     General: No scleral icterus.    Conjunctiva/sclera: Conjunctivae normal.  Neck:     Thyroid: No thyromegaly.  Cardiovascular:     Rate and Rhythm: Normal rate and regular rhythm.     Heart sounds: No murmur heard.    No friction rub. No gallop.  Pulmonary:     Breath sounds: No stridor. No wheezing or rales.  Chest:     Chest wall: No tenderness.  Abdominal:     General: There is no distension.     Tenderness: There is no abdominal tenderness. There is no rebound.  Musculoskeletal:        General: Normal range of motion.     Cervical back: Neck supple.  Lymphadenopathy:     Cervical: No cervical adenopathy.  Skin:    Findings: No erythema or rash.  Neurological:     Mental Status: She is alert and oriented to person, place, and time.     Motor: No abnormal muscle tone.     Coordination: Coordination normal.  Psychiatric:        Behavior: Behavior normal.     ED Results / Procedures / Treatments   Labs (all labs ordered are listed, but only abnormal results are displayed) Labs Reviewed  BASIC METABOLIC PANEL - Abnormal; Notable for the following components:      Result Value   Potassium 3.3 (*)    Creatinine, Ser 1.11 (*)    GFR, Estimated 59 (*)    All other components within normal limits  RESP PANEL BY RT-PCR (RSV, FLU A&B, COVID)  RVPGX2  CBC  URINALYSIS, ROUTINE W REFLEX MICROSCOPIC  I-STAT BETA HCG BLOOD, ED ([facility], [facility], [facility] ONLY)  CBG MONITORING, ED  TROPONIN I (HIGH SENSITIVITY)  TROPONIN I (HIGH SENSITIVITY)    EKG None  Radiology CT Head Wo  Contrast  Result Date: 07/17/2022 CLINICAL DATA:  Dizziness. EXAM: CT HEAD WITHOUT CONTRAST TECHNIQUE: Contiguous axial images were obtained from the base of the skull through the vertex without intravenous contrast. RADIATION DOSE REDUCTION: This exam was performed according to the departmental dose-optimization program which includes automated exposure control, adjustment of the mA and/or kV according to patient size and/or use of iterative reconstruction technique. COMPARISON:  CT Head September 13, 2007. FINDINGS: Brain: No evidence of acute infarction, hemorrhage, hydrocephalus, extra-axial collection or mass lesion/mass effect. Vascular: No hyperdense vessel identified. Skull: No acute fracture. Sinuses/Orbits: Largely clear sinuses.  No acute orbital findings. Other: No mastoid effusions. IMPRESSION: No evidence of acute  intracranial abnormality. Electronically Signed   By: Margaretha Sheffield M.D.   On: 07/17/2022 13:29   DG Chest 2 View  Result Date: 07/17/2022 CLINICAL DATA:  Chest pain and cough with dyspnea. EXAM: CHEST - 2 VIEW COMPARISON:  July 04, 2022 FINDINGS: Heart size is stable and mildly enlarged. Mediastinal contours and hilar structures are normal. Lungs are clear. No pneumothorax.  No pleural effusion. On limited assessment no acute skeletal process. IMPRESSION: Mild cardiomegaly without acute cardiopulmonary disease. Electronically Signed   By: Zetta Bills M.D.   On: 07/17/2022 13:04    Procedures Procedures    Medications Ordered in ED Medications  lidocaine (LIDODERM) 5 % 1 patch (1 patch Transdermal Patch Applied 07/17/22 1758)  acetaminophen (TYLENOL) tablet 650 mg (650 mg Oral Given 07/17/22 1757)  sodium chloride 0.9 % bolus 1,000 mL (0 mLs Intravenous Stopped 07/17/22 1927)    ED Course/ Medical Decision Making/ A&P                           Medical Decision Making Amount and/or Complexity of Data Reviewed Labs: ordered. Radiology: ordered.  Risk Prescription drug  management.  This patient presents to the ED for concern of cough and dizziness, this involves an extensive number of treatment options, and is a complaint that carries with it a high risk of complications and morbidity.  The differential diagnosis includes bowel syndrome, sepsis, stroke   Co morbidities that complicate the patient evaluation  Hypertension diabetes   Additional history obtained:  Additional history obtained from patient External records from outside source obtained and reviewed including hospital records   Lab Tests:  I Ordered, and personally interpreted labs.  The pertinent results include: COVID and flu negative white blood count 5.9   Imaging Studies ordered:  I ordered imaging studies including CT head and chest x-ray unremarkable I independently visualized and interpreted imaging which showed unremarkable I agree with the radiologist interpretation   Cardiac Monitoring: / EKG:  The patient was maintained on a cardiac monitor.  I personally viewed and interpreted the cardiac monitored which showed an underlying rhythm of: Normal sinus rhythm   Consultations Obtained:  No consult  Problem List / ED Course / Critical interventions / Medication management  Hypertension diabetes and viral syndrome I ordered medication including normal saline for dehydration Reevaluation of the patient after these medicines showed that the patient improved I have reviewed the patients home medicines and have made adjustments as needed   Social Determinants of Health:  None   Test / Admission - Considered:  None  Patient with viral syndrome.  She has been hydrated with a liter of fluids and will be discharged home       Final Clinical Impression(s) / ED Diagnoses Final diagnoses:  Dizziness  Viral syndrome    Rx / DC Orders ED Discharge Orders          Ordered    HYDROcodone-acetaminophen (NORCO/VICODIN) 5-325 MG tablet  Every 6 hours PRN         07/17/22 2113              Milton Ferguson, MD 07/19/22 1219

## 2022-07-17 NOTE — ED Provider Triage Note (Signed)
Emergency Medicine Provider Triage Evaluation Note  Sheri Park , a 57 y.o. female  was evaluated in triage.  Pt complains of dizziness onset 4:30 am this morning. No sick contacts. Heart attack in 2018 with cath noted. No stents noted. Associated cough, mid sternal chest pain, headache to frontal region x 2 days. Denies shortness of breath, rhinorrhea, nasal congestion, vision changes. 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 History of asthma. No history of COPD. Dealing with her mothers death recently and increased work stressors.    Review of Systems  Positive:  Negative:   Physical Exam  BP (!) 133/97 (BP Location: Left Arm)   Pulse 74   Temp 98.7 F (37.1 C) (Oral)   Resp 18   Ht 5\' 4"  (1.626 m)   Wt (!) 150.5 kg   LMP 12/15/2019   SpO2 99%   BMI 56.95 kg/m  Gen:   Awake, no distress   Resp:  Normal effort  MSK:   Moves extremities without difficulty  Other:  Left chest wall TTP.   Medical Decision Making  Medically screening exam initiated at 12:22 PM.  Appropriate orders placed.  Avyanna A Milling was informed that the remainder of the evaluation will be completed by another provider, this initial triage assessment does not replace that evaluation, and the importance of remaining in the ED until their evaluation is complete.     Toneisha Savary A, PA-C 07/17/22 1228

## 2022-07-18 ENCOUNTER — Telehealth: Payer: Self-pay | Admitting: *Deleted

## 2022-07-18 NOTE — Patient Outreach (Signed)
  Care Coordination [facility] Note Transition Care Management Follow-up Telephone Call Date of discharge and from where: 07/17/22 from [facility] How have you been since you were released from the hospital? Patient continues to have pain. Unable to take prescribed pain medication Any questions or concerns? Yes  Items Reviewed: Did the pt receive and understand the discharge instructions provided? Yes  Medications obtained and verified? Yes  Other? Yes Provided patient with MD referral line [phone] to call and establish care with PCP Any new allergies since your discharge? No  Dietary orders reviewed? Yes Do you have support at home? Yes   Home Care and Equipment/Supplies: Were home health services ordered? no If so, what is the name of the agency? N/A  Has the agency set up a time to come to the patient's home? not applicable Were any new equipment or medical supplies ordered?  No What is the name of the medical supply agency? N/A Were you able to get the supplies/equipment? not applicable Do you have any questions related to the use of the equipment or supplies? No  Functional Questionnaire: (I = Independent and D = Dependent) ADLs: I  Bathing/Dressing- I  Meal Prep- I  Eating- I  Maintaining continence- I  Transferring/Ambulation- I  Managing Meds- I  Follow up appointments reviewed:  PCP Hospital f/u appt confirmed?  N/A, patient does not have a PCP   . [facility] f/u appt confirmed?  Yes, Scheduled to see Cardiology 07/23/22   Are transportation arrangements needed? No  If their condition worsens, is the pt aware to call PCP or go to the Emergency Dept.? Yes Was the patient provided with contact information for the PCP's office or ED? No Was to pt encouraged to call back with questions or concerns? Yes  SDOH assessments and interventions completed:   Yes  RNCM provided patient with information for Case Management with [facility] Care Team. Patient declines CM  services at this time.   Lurena Joiner RN, BSN [place]  [employer] manager

## 2022-08-18 ENCOUNTER — Encounter (HOSPITAL_COMMUNITY): Payer: Self-pay

## 2022-08-18 ENCOUNTER — Other Ambulatory Visit: Payer: Self-pay

## 2022-08-18 ENCOUNTER — Emergency Department (HOSPITAL_COMMUNITY)
Admission: EM | Admit: 2022-08-18 | Discharge: 2022-08-18 | Disposition: A | Discharge status: Home / Self Care | Payer: Managed Care, Other (non HMO) | Attending: Emergency Medicine | Admitting: Emergency Medicine

## 2022-08-18 ENCOUNTER — Emergency Department (HOSPITAL_COMMUNITY): Payer: Managed Care, Other (non HMO)

## 2022-08-18 DIAGNOSIS — Z7901 Long term (current) use of anticoagulants: Secondary | ICD-10-CM | POA: Diagnosis not present

## 2022-08-18 DIAGNOSIS — I10 Essential (primary) hypertension: Secondary | ICD-10-CM | POA: Insufficient documentation

## 2022-08-18 DIAGNOSIS — Z7951 Long term (current) use of inhaled steroids: Secondary | ICD-10-CM | POA: Insufficient documentation

## 2022-08-18 DIAGNOSIS — J45909 Unspecified asthma, uncomplicated: Secondary | ICD-10-CM | POA: Diagnosis not present

## 2022-08-18 DIAGNOSIS — R059 Cough, unspecified: Secondary | ICD-10-CM | POA: Diagnosis not present

## 2022-08-18 DIAGNOSIS — Z20822 Contact with and (suspected) exposure to covid-19: Secondary | ICD-10-CM | POA: Diagnosis not present

## 2022-08-18 DIAGNOSIS — J189 Pneumonia, unspecified organism: Secondary | ICD-10-CM | POA: Diagnosis not present

## 2022-08-18 DIAGNOSIS — Z91010 Allergy to peanuts: Secondary | ICD-10-CM | POA: Diagnosis not present

## 2022-08-18 DIAGNOSIS — R519 Headache, unspecified: Secondary | ICD-10-CM | POA: Diagnosis present

## 2022-08-18 DIAGNOSIS — E119 Type 2 diabetes mellitus without complications: Secondary | ICD-10-CM | POA: Insufficient documentation

## 2022-08-18 DIAGNOSIS — Z79899 Other long term (current) drug therapy: Secondary | ICD-10-CM | POA: Insufficient documentation

## 2022-08-18 DIAGNOSIS — Z91040 Latex allergy status: Secondary | ICD-10-CM | POA: Insufficient documentation

## 2022-08-18 LAB — RESP PANEL BY RT-PCR (RSV, FLU A&B, COVID)  RVPGX2
Influenza A by PCR: NEGATIVE
Influenza B by PCR: NEGATIVE
Resp Syncytial Virus by PCR: NEGATIVE
SARS Coronavirus 2 by RT PCR: NEGATIVE

## 2022-08-18 MED ORDER — HYDROCODONE BIT-HOMATROP MBR 5-1.5 MG/5ML PO SOLN: 5.0000 mL | Freq: Four times a day (QID) | ORAL | 0 refills | Status: DC | PRN

## 2022-08-18 MED ORDER — SODIUM CHLORIDE 0.9 % IV SOLN
500.0000 mg | Freq: Once | INTRAVENOUS | Status: AC
  Administered 2022-08-18: 500 mg via INTRAVENOUS
  Filled 2022-08-18: qty 5

## 2022-08-18 MED ORDER — SODIUM CHLORIDE 0.9 % IV SOLN
1.0000 g | Freq: Once | INTRAVENOUS | Status: AC
  Administered 2022-08-18: 1 g via INTRAVENOUS
  Filled 2022-08-18: qty 10

## 2022-08-18 MED ORDER — AZITHROMYCIN 250 MG PO TABS: 250.0000 mg | ORAL_TABLET | Freq: Every day | ORAL | 0 refills | Status: DC

## 2022-08-18 NOTE — Discharge Instructions (Signed)
Take your antibiotics as discussed.  Return to the emergency room if you have any worsening symptoms.  Otherwise make an appointment to have close follow-up with your primary care doctor.

## 2022-08-18 NOTE — ED Provider Notes (Signed)
[facility] Provider Note   CSN: [id number] Arrival date & time: 08/18/22  1011     History  No chief complaint on file.   Sheri Park is a 57 y.o. female.  Patient is a 57 year old female with a history of asthma, hypertension, diabetes who presents with cough and cold symptoms.  She says it started 2 days ago.  She has had some chills associate with myalgias and headache as well as coughing with congestion.  No rhinorrhea.  No vomiting.  She says she feels little short of breath and she hurts in the center of her chest from coughing.  No leg swelling.  She also notes fevers up to 102.       Home Medications Prior to Admission medications   Medication Sig Start Date End Date Taking? Authorizing Provider  azithromycin (ZITHROMAX) 250 MG tablet Take 1 tablet (250 mg total) by mouth daily. Take 1 tablet daily for the next 4 days starting on February 6. 08/18/22  Yes Malvin Johns, MD  HYDROcodone bit-homatropine (HYCODAN) 5-1.5 MG/5ML syrup Take 5 mLs by mouth every 6 (six) hours as needed for cough. 08/18/22  Yes Malvin Johns, MD  albuterol (VENTOLIN HFA) 108 (90 Base) MCG/ACT inhaler Inhale 1-2 puffs into the lungs every 6 (six) hours as needed for wheezing or shortness of breath. Patient not taking: Reported on 07/18/2022 08/01/20   Augusto Gamble B, NP  amLODipine (NORVASC) 5 MG tablet Take 5 mg by mouth daily.    [provider]  apixaban (ELIQUIS) 5 MG TABS tablet Take 1 tablet (5 mg total) by mouth 2 (two) times daily. 08/10/20   Charlynne Cousins, MD  blood glucose meter kit and supplies KIT Dispense based on patient and insurance preference. Use up to four times daily as directed. (FOR ICD-9 250.00, 250.01). Patient not taking: Reported on 07/18/2022 08/10/20   Charlynne Cousins, MD  cyclobenzaprine (FLEXERIL) 5 MG tablet Take 1 tablet (5 mg total) by mouth at bedtime. Patient not taking: Reported on 07/18/2022 08/01/20    Augusto Gamble B, NP  metoprolol tartrate (LOPRESSOR) 25 MG tablet Take 0.5 tablets (12.5 mg total) by mouth 2 (two) times daily. 08/10/20   Charlynne Cousins, MD  Multiple Vitamins-Calcium (ONE-A-DAY WOMENS FORMULA) TABS Take 1 tablet by mouth daily with breakfast.    [provider]  omeprazole (PRILOSEC) 20 MG capsule Take 1 capsule (20 mg total) by mouth daily. Patient not taking: Reported on 07/18/2022 08/08/21   Blanchie Dessert, MD  ondansetron (ZOFRAN) 4 MG tablet Take 1 tablet (4 mg total) by mouth every 6 (six) hours. 08/08/21   Blanchie Dessert, MD  ondansetron (ZOFRAN) 4 MG tablet Take 1 tablet (4 mg total) by mouth every 8 (eight) hours as needed for nausea or vomiting. 07/04/22   Nyoka Lint, PA-C  ferrous sulfate 325 (65 FE) MG tablet Take 1 tablet (325 mg total) by mouth 2 (two) times daily with a meal. Patient not taking: Reported on 05/06/2019 10/21/16 12/22/19  Dixie Dials, MD  fluticasone (FLOVENT HFA) 110 MCG/ACT inhaler Inhale 1 puff into the lungs 2 (two) times daily. Patient not taking: Reported on 05/06/2019 12/06/14 12/22/19  Dorie Rank, MD  potassium chloride (K-DUR) 10 MEQ tablet Take 10 mEq by mouth daily.  12/22/19  [provider]  promethazine (PHENERGAN) 25 MG tablet Take 1 tablet (25 mg total) by mouth every 6 (six) hours as needed for nausea or vomiting. Patient not taking:  Reported on 08/20/2018 06/16/18 12/22/19  Rodriguez-Southworth, Sunday Spillers, PA-C      Allergies    Other, Peanut-containing drug products, Shellfish allergy, Tape, Codeine, Eggs or egg-derived products, Ketorolac tromethamine, Milk-related compounds, Toradol [ketorolac tromethamine], Latex, and Mucinex [guaifenesin er]    Review of Systems   Review of Systems  Constitutional:  Positive for chills and fatigue. Negative for diaphoresis and fever.  HENT:  Positive for congestion. Negative for rhinorrhea and sneezing.   Eyes: Negative.   Respiratory:  Positive for cough and shortness  of breath. Negative for chest tightness.   Cardiovascular:  Negative for chest pain and leg swelling.  Gastrointestinal:  Negative for abdominal pain, blood in stool, diarrhea, nausea and vomiting.  Genitourinary:  Negative for difficulty urinating, flank pain, frequency and hematuria.  Musculoskeletal:  Positive for myalgias. Negative for arthralgias and back pain.  Skin:  Negative for rash.  Neurological:  Positive for headaches. Negative for dizziness, speech difficulty, weakness and numbness.    Physical Exam Updated Vital Signs BP (!) 174/96 (BP Location: Left Arm)   Pulse 72   Temp 98.4 F (36.9 C)   Resp 18   LMP 12/15/2019   SpO2 98%  Physical Exam Constitutional:      Appearance: She is well-developed.  HENT:     Head: Normocephalic and atraumatic.  Eyes:     Pupils: Pupils are equal, round, and reactive to light.  Neck:     Comments: No meningismus Cardiovascular:     Rate and Rhythm: Normal rate and regular rhythm.     Heart sounds: Normal heart sounds.  Pulmonary:     Effort: Pulmonary effort is normal. No respiratory distress.     Breath sounds: Normal breath sounds. No wheezing or rales.  Chest:     Chest wall: No tenderness.  Abdominal:     General: Bowel sounds are normal.     Palpations: Abdomen is soft.     Tenderness: There is no abdominal tenderness. There is no guarding or rebound.  Musculoskeletal:        General: Normal range of motion.     Cervical back: Normal range of motion and neck supple.  Lymphadenopathy:     Cervical: No cervical adenopathy.  Skin:    General: Skin is warm and dry.     Findings: No rash.  Neurological:     Mental Status: She is alert and oriented to person, place, and time.     ED Results / Procedures / Treatments   Labs (all labs ordered are listed, but only abnormal results are displayed) Labs Reviewed  RESP PANEL BY RT-PCR (RSV, FLU A&B, COVID)  RVPGX2    EKG None  Radiology DG Chest 2 View  Result  Date: 08/18/2022 CLINICAL DATA:  Cough EXAM: CHEST - 2 VIEW COMPARISON:  07/17/22 CXR FINDINGS: The heart size and mediastinal contours are within normal limits. No focal airspace opacity. There are prominent bilateral interstitial opacities, which are nonspecific, but could represent changes related to an atypical infection. The visualized skeletal structures are unremarkable. IMPRESSION: There are prominent bilateral interstitial opacities, which are nonspecific, but could be seen with atypical infection. No focal airspace opacity. Electronically Signed   By: Marin Roberts M.D.   On: 08/18/2022 11:46    Procedures Procedures    Medications Ordered in ED Medications  azithromycin (ZITHROMAX) 500 mg in sodium chloride 0.9 % 250 mL IVPB (has no administration in time range)  cefTRIAXone (ROCEPHIN) 1 g in sodium chloride 0.9 %  100 mL IVPB (1 g Intravenous New Bag/Given 08/18/22 1335)    ED Course/ Medical Decision Making/ A&P                             Medical Decision Making Amount and/or Complexity of Data Reviewed Radiology: ordered.  Risk Prescription drug management.   Patient is a 57 year old female who presents with fever cough and bodyaches.  Her COVID/flu and RSV test are negative.  Chest x-ray which was interpreted by me and confirmed by the radiologist shows evidence of some interstitial markings consistent with possible atypical pneumonia.  She overall is well-appearing.  Her vital signs are stable.  She has no hypoxia.  She is not significantly short of breath.  Her lungs have some crackles but otherwise clear no wheezing.  I feel at this point she is appropriate for outpatient treatment.  She was given dose of IV Rocephin and Zithromax.  She was discharged home in good condition.  She was given a prescription for Zithromax.  She was also given a prescription for Hycodan cough syrup.  She was encouraged to have close follow-up with her PCP.  Return precautions were given.  Final  Clinical Impression(s) / ED Diagnoses Final diagnoses:  Community acquired pneumonia, unspecified laterality    Rx / DC Orders ED Discharge Orders          Ordered    azithromycin (ZITHROMAX) 250 MG tablet  Daily        08/18/22 1359    HYDROcodone bit-homatropine (HYCODAN) 5-1.5 MG/5ML syrup  Every 6 hours PRN        08/18/22 1359              Malvin Johns, MD 08/18/22 1412

## 2022-08-18 NOTE — ED Triage Notes (Signed)
Patient complains of 2 days of cough, congestion, body aches. Not taking muccinex as she reports allergic. Denies fever. Alert and oriented. Also complains of chest wall pain with inspiration and cough. Cough worse when she lays flat.

## 2022-08-18 NOTE — ED Notes (Signed)
Patient verbalizes understanding of discharge instructions. Opportunity for questioning and answers were provided. Armband removed by staff, pt discharged from ED. Pt taken to ED entrance via wheel chair.  

## 2022-10-28 ENCOUNTER — Emergency Department (HOSPITAL_COMMUNITY)
Admission: EM | Admit: 2022-10-28 | Discharge: 2022-10-28 | Disposition: A | Discharge status: Home / Self Care | Payer: Managed Care, Other (non HMO) | Attending: Emergency Medicine | Admitting: Emergency Medicine

## 2022-10-28 ENCOUNTER — Other Ambulatory Visit: Payer: Self-pay

## 2022-10-28 ENCOUNTER — Emergency Department (HOSPITAL_COMMUNITY): Payer: Managed Care, Other (non HMO)

## 2022-10-28 DIAGNOSIS — I10 Essential (primary) hypertension: Secondary | ICD-10-CM | POA: Insufficient documentation

## 2022-10-28 DIAGNOSIS — R0789 Other chest pain: Secondary | ICD-10-CM | POA: Diagnosis present

## 2022-10-28 DIAGNOSIS — Z7951 Long term (current) use of inhaled steroids: Secondary | ICD-10-CM | POA: Insufficient documentation

## 2022-10-28 DIAGNOSIS — Z91040 Latex allergy status: Secondary | ICD-10-CM | POA: Diagnosis not present

## 2022-10-28 DIAGNOSIS — Z91010 Allergy to peanuts: Secondary | ICD-10-CM | POA: Insufficient documentation

## 2022-10-28 DIAGNOSIS — J45909 Unspecified asthma, uncomplicated: Secondary | ICD-10-CM | POA: Insufficient documentation

## 2022-10-28 DIAGNOSIS — I4891 Unspecified atrial fibrillation: Secondary | ICD-10-CM | POA: Diagnosis not present

## 2022-10-28 DIAGNOSIS — Z79899 Other long term (current) drug therapy: Secondary | ICD-10-CM | POA: Insufficient documentation

## 2022-10-28 DIAGNOSIS — M79671 Pain in right foot: Secondary | ICD-10-CM

## 2022-10-28 DIAGNOSIS — E119 Type 2 diabetes mellitus without complications: Secondary | ICD-10-CM | POA: Diagnosis not present

## 2022-10-28 DIAGNOSIS — Z7901 Long term (current) use of anticoagulants: Secondary | ICD-10-CM | POA: Insufficient documentation

## 2022-10-28 LAB — CBC WITH DIFFERENTIAL/PLATELET
Abs Immature Granulocytes: 0.02 10*3/uL (ref 0.00–0.07)
Basophils Absolute: 0 10*3/uL (ref 0.0–0.1)
Basophils Relative: 0 %
Eosinophils Absolute: 0.1 10*3/uL (ref 0.0–0.5)
Eosinophils Relative: 1 %
HCT: 38 % (ref 36.0–46.0)
Hemoglobin: 12.2 g/dL (ref 12.0–15.0)
Immature Granulocytes: 0 %
Lymphocytes Relative: 44 %
Lymphs Abs: 2.2 10*3/uL (ref 0.7–4.0)
MCH: 26.2 pg (ref 26.0–34.0)
MCHC: 32.1 g/dL (ref 30.0–36.0)
MCV: 81.5 fL (ref 80.0–100.0)
Monocytes Absolute: 0.5 10*3/uL (ref 0.1–1.0)
Monocytes Relative: 9 %
Neutro Abs: 2.2 10*3/uL (ref 1.7–7.7)
Neutrophils Relative %: 46 %
Platelets: 259 10*3/uL (ref 150–400)
RBC: 4.66 MIL/uL (ref 3.87–5.11)
RDW: 14.7 % (ref 11.5–15.5)
WBC: 4.9 10*3/uL (ref 4.0–10.5)
nRBC: 0 % (ref 0.0–0.2)

## 2022-10-28 LAB — COMPREHENSIVE METABOLIC PANEL
ALT: 17 U/L (ref 0–44)
AST: 20 U/L (ref 15–41)
Albumin: 3.4 g/dL — ABNORMAL LOW (ref 3.5–5.0)
Alkaline Phosphatase: 59 U/L (ref 38–126)
Anion gap: 10 (ref 5–15)
BUN: 7 mg/dL (ref 6–20)
CO2: 29 mmol/L (ref 22–32)
Calcium: 9 mg/dL (ref 8.9–10.3)
Chloride: 102 mmol/L (ref 98–111)
Creatinine, Ser: 0.93 mg/dL (ref 0.44–1.00)
GFR, Estimated: >60 mL/min (ref >60)
Glucose, Bld: 97 mg/dL (ref 70–99)
Potassium: 3.2 mmol/L — ABNORMAL LOW (ref 3.5–5.1)
Sodium: 141 mmol/L (ref 135–145)
Total Bilirubin: 1 mg/dL (ref 0.3–1.2)
Total Protein: 7.1 g/dL (ref 6.5–8.1)

## 2022-10-28 LAB — TROPONIN I (HIGH SENSITIVITY)
Troponin I (High Sensitivity): 9 ng/L (ref <18)
Troponin I (High Sensitivity): 10 ng/L (ref <18)

## 2022-10-28 MED ORDER — CYCLOBENZAPRINE HCL 10 MG PO TABS: 10.0000 mg | ORAL_TABLET | Freq: Two times a day (BID) | ORAL | 0 refills | Status: DC | PRN

## 2022-10-28 MED ORDER — POTASSIUM CHLORIDE CRYS ER 20 MEQ PO TBCR
40.0000 meq | EXTENDED_RELEASE_TABLET | Freq: Once | ORAL | Status: AC
  Administered 2022-10-28: 40 meq via ORAL
  Filled 2022-10-28: qty 2

## 2022-10-28 NOTE — ED Triage Notes (Signed)
Pt arrives with multiple complaints: reports for two weeks having a knot on her R foot without injury that is causing pain up her whole leg, along with occasional muscle spasms. Also states she got upset at work yesterday and had chest pain all evening. Today is nauseated with some mild abdominal discomfort.

## 2022-10-28 NOTE — ED Provider Notes (Signed)
[place] EMERGENCY DEPARTMENT AT [facility] Provider Note   CSN: [id number] Arrival date & time: 10/28/22  1422     History  Chief Complaint  Patient presents with   Leg Swelling   Nausea   Chest Pain    Sheri Park is a 57 y.o. female.  With a history of A-fib on Eliquis, asthma, diabetes, hypertension who presents to the ED for evaluation of right foot and chest pain.  Right foot pain began 2 weeks ago.  She states she works 12-hour shifts and is on her feet for the entirety of the shift.  It is difficult to walk towards the end of the day due to the pain.  She has been taking Tylenol and Motrin with minimal relief.  She states that she noticed a bump to dorsum of the right foot shortly after symptoms began.  Pain is alleviated by resting and elevating her legs.  Her chest pain began 4 days ago.  Pain is described as intermittent.  Initially begins to the left side of the sternum with intermittent radiation to the left axilla.  It is not exertional.  She denies associated nausea, vomiting, diaphoresis.  Pain lasts 3 to 4 minutes at a time and then resolve spontaneously.  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  She does report a pins-and-needles type sensation to the top of her right foot    Chest Pain      Home Medications Prior to Admission medications   Medication Sig Start Date End Date Taking? Authorizing Provider  cyclobenzaprine (FLEXERIL) 10 MG tablet Take 1 tablet (10 mg total) by mouth 2 (two) times daily as needed for muscle spasms. 10/28/22  Yes Vicy Medico, Edsel Petrin, PA-C  albuterol (VENTOLIN HFA) 108 (90 Base) MCG/ACT inhaler Inhale 1-2 puffs into the lungs every 6 (six) hours as needed for wheezing or shortness of breath. Patient not taking: Reported on 07/18/2022 08/01/20   Linus Mako B, NP  amLODipine (NORVASC) 5 MG tablet Take 5 mg by mouth daily.    [provider]  apixaban (ELIQUIS) 5 MG TABS tablet  Take 1 tablet (5 mg total) by mouth 2 (two) times daily. 08/10/20   Marinda Elk, MD  azithromycin (ZITHROMAX) 250 MG tablet Take 1 tablet (250 mg total) by mouth daily. Take 1 tablet daily for the next 4 days starting on February 6. 08/18/22   Rolan Bucco, MD  blood glucose meter kit and supplies KIT Dispense based on patient and insurance preference. Use up to four times daily as directed. (FOR ICD-9 250.00, 250.01). Patient not taking: Reported on 07/18/2022 08/10/20   Marinda Elk, MD  HYDROcodone bit-homatropine [facility]) 5-1.5 MG/5ML syrup Take 5 mLs by mouth every 6 (six) hours as needed for cough. 08/18/22   Rolan Bucco, MD  metoprolol tartrate (LOPRESSOR) 25 MG tablet Take 0.5 tablets (12.5 mg total) by mouth 2 (two) times daily. 08/10/20   Marinda Elk, MD  Multiple Vitamins-Calcium (ONE-A-DAY WOMENS FORMULA) TABS Take 1 tablet by mouth daily with breakfast.    [provider]  omeprazole (PRILOSEC) 20 MG capsule Take 1 capsule (20 mg total) by mouth daily. Patient not taking: Reported on 07/18/2022 08/08/21   Gwyneth Sprout, MD  ondansetron (ZOFRAN) 4 MG tablet Take 1 tablet (4 mg total) by mouth every 6 (six) hours. 08/08/21   Gwyneth Sprout, MD  ondansetron (ZOFRAN) 4 MG tablet Take 1 tablet (4 mg total) by mouth  every 8 (eight) hours as needed for nausea or vomiting. 07/04/22   Ellsworth Lennox, PA-C  ferrous sulfate 325 (65 FE) MG tablet Take 1 tablet (325 mg total) by mouth 2 (two) times daily with a meal. Patient not taking: Reported on 05/06/2019 10/21/16 12/22/19  Orpah Cobb, MD  fluticasone (FLOVENT HFA) 110 MCG/ACT inhaler Inhale 1 puff into the lungs 2 (two) times daily. Patient not taking: Reported on 05/06/2019 12/06/14 12/22/19  Linwood Dibbles, MD  potassium chloride (K-DUR) 10 MEQ tablet Take 10 mEq by mouth daily.  12/22/19  [provider]  promethazine (PHENERGAN) 25 MG tablet Take 1 tablet (25 mg total) by mouth every 6 (six) hours as  needed for nausea or vomiting. Patient not taking: Reported on 08/20/2018 06/16/18 12/22/19  Rodriguez-Southworth, Nettie Elm, PA-C      Allergies    Other, Peanut-containing drug products, Shellfish allergy, Tape, Codeine, Egg-derived products, Ketorolac tromethamine, Milk-related compounds, Toradol [ketorolac tromethamine], Latex, and Mucinex [guaifenesin er]    Review of Systems   Review of Systems  Cardiovascular:  Positive for chest pain.  Musculoskeletal:  Positive for arthralgias.  All other systems reviewed and are negative.   Physical Exam Updated Vital Signs BP (!) 143/90 (BP Location: Left Arm)   Pulse 73   Temp 98 F (36.7 C) (Oral)   Resp 15   LMP 12/15/2019   SpO2 99%  Physical Exam Vitals and nursing note reviewed.  Constitutional:      General: She is not in acute distress.    Appearance: She is well-developed. She is obese. She is not ill-appearing, toxic-appearing or diaphoretic.     Comments: Resting comfortably in bed  HENT:     Head: Normocephalic and atraumatic.  Eyes:     Conjunctiva/sclera: Conjunctivae normal.  Cardiovascular:     Rate and Rhythm: Normal rate and regular rhythm.     Heart sounds: No murmur heard. Pulmonary:     Effort: Pulmonary effort is normal. No respiratory distress.     Breath sounds: Normal breath sounds.  Chest:     Chest wall: No tenderness.     Comments: No rashes Abdominal:     Palpations: Abdomen is soft.     Tenderness: There is no abdominal tenderness.  Musculoskeletal:        General: No swelling.     Cervical back: Neck supple.     Right lower leg: No edema.     Left lower leg: No edema.       Feet:     Comments: 2 cm x 2 cm fluctuant cystlike structure to the dorsum of the right midfoot.  No surrounding erythema or warmth.  No drainage.  No crepitus.  Skin:    General: Skin is warm and dry.     Capillary Refill: Capillary refill takes less than 2 seconds.  Neurological:     General: No focal deficit present.      Mental Status: She is alert and oriented to person, place, and time.  Psychiatric:        Mood and Affect: Mood normal.     ED Results / Procedures / Treatments   Labs (all labs ordered are listed, but only abnormal results are displayed) Labs Reviewed  COMPREHENSIVE METABOLIC PANEL - Abnormal; Notable for the following components:      Result Value   Potassium 3.2 (*)    Albumin 3.4 (*)    All other components within normal limits  CBC WITH DIFFERENTIAL/PLATELET  TROPONIN I (HIGH  SENSITIVITY)  TROPONIN I (HIGH SENSITIVITY)    EKG EKG Interpretation  Date/Time:  Tuesday October 28 2022 15:27:48 EDT Ventricular Rate:  65 PR Interval:  152 QRS Duration: 84 QT Interval:  406 QTC Calculation: 422 R Axis:   11 Text Interpretation: Normal sinus rhythm Confirmed by Gloris Manchester [phone]) on 10/28/2022 8:40:50 PM  Radiology DG Foot 2 Views Right  Result Date: 10/28/2022 CLINICAL DATA:  Foot swelling and pain EXAM: RIGHT FOOT - 2 VIEW COMPARISON:  None Available. FINDINGS: Chronically fragmented spurring the medial base of the distal phalanx great toe. 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 Plantar and Achilles calcaneal spurs. Mild dorsal subcutaneous edema along the forefoot. IMPRESSION: 1. Mild dorsal subcutaneous edema along the forefoot. 2. Plantar and Achilles calcaneal spurs. 3. 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 Electronically Signed   By: Gaylyn Rong M.D.   On: 10/28/2022 16:17   DG Chest 2 View  Result Date: 10/28/2022 CLINICAL DATA:  Chest pain EXAM: CHEST - 2 VIEW COMPARISON:  None Available. FINDINGS: No pleural effusion. No pneumothorax. No focal airspace opacity. No radiographically apparent displaced rib fractures. Visualized upper abdomen is unremarkable. Vertebral body heights are maintained. IMPRESSION: No focal airspace opacity. Electronically Signed   By: Lorenza Cambridge  M.D.   On: 10/28/2022 16:12    Procedures Procedures    Medications Ordered in ED Medications  potassium chloride SA (KLOR-CON M) CR tablet 40 mEq (40 mEq Oral Given 10/28/22 2228)    ED Course/ Medical Decision Making/ A&P             HEART Score: 3                Medical Decision Making Risk Prescription drug management.  This patient presents to the ED for concern of right foot pain, chest pain, this involves an extensive number of treatment options, and is a complaint that carries with it a high risk of complications and morbidity.  The emergent differential diagnosis of chest pain includes: Acute coronary syndrome, pericarditis, aortic dissection, pulmonary embolism, tension pneumothorax, and esophageal rupture.  other urgent/non-acute considerations include, but are not limited to: chronic angina, aortic stenosis, cardiomyopathy, myocarditis, mitral valve prolapse, pulmonary hypertension, hypertrophic obstructive cardiomyopathy (HOCM), aortic insufficiency, right ventricular hypertrophy, pneumonia, pleuritis, bronchitis, pneumothorax, tumor, gastroesophageal reflux disease (GERD), esophageal spasm, Mallory-Weiss syndrome, peptic ulcer disease, biliary disease, pancreatitis, functional gastrointestinal pain, cervical or thoracic disk disease or arthritis, shoulder arthritis, costochondritis, subacromial bursitis, anxiety or panic attack, herpes zoster, breast disorders, chest wall tumors, thoracic outlet syndrome, mediastinitis.  Co morbidities that complicate the patient evaluation  A-fib on Eliquis, asthma, diabetes, hypertension  My initial workup includes ACS rule out, x-ray right foot  Additional history obtained from: Nursing notes from this visit.  I ordered, reviewed and interpreted labs which include: CBC, CMP, troponin, delta troponin.  Hypokalemia 3.2.  Labs otherwise reassuring  I ordered imaging studies including chest x-ray, x-ray right foot I independently  visualized and interpreted imaging which showed negative chest x-ray, soft tissue edema along the dorsum of the right forefoot I agree with the radiologist interpretation  Afebrile, hypertensive but otherwise hemodynamically stable.  57 year old female presenting to the ED for main complaint of right foot pain.  On exam there is a cystlike structure to the dorsum of the right midfoot.  There is no warmth or erythema. Likely an epidermal inclusion cyst vs ganglion cyst.  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  Initial and delta troponin negative.  EKG without ischemic changes.  Chest x-ray negative.  Heart score 3 and I have low suspicion for ACS as a cause of her symptoms, however she is a cardiology patient and she was encouraged to follow-up with her cardiologist as soon as possible regarding her symptoms.  She was given contact information for [facility] for her right foot pain.  She was given a postop shoe for comfort.  She was also given a prescription for muscle relaxer as she reported some spasmodic type pain in the foot.  She was given return precautions.  Stable at discharge.  At this time there does not appear to be any evidence of an acute emergency medical condition and the patient appears stable for discharge with appropriate outpatient follow up. Diagnosis was discussed with patient who verbalizes understanding of care plan and is agreeable to discharge. I have discussed return precautions with patient who verbalizes understanding. Patient encouraged to follow-up with their PCP within 1 week. All questions answered.  Patient's case discussed with Dr. Durwin Nora who agrees with plan to discharge with follow-up.   Note: Portions of this report may have been transcribed using voice recognition software. Every effort was made to ensure accuracy; however, inadvertent computerized transcription errors may still be  present.        Final Clinical Impression(s) / ED Diagnoses Final diagnoses:  Right foot pain  Atypical chest pain    Rx / DC Orders ED Discharge Orders          Ordered    cyclobenzaprine (FLEXERIL) 10 MG tablet  2 times daily PRN        10/28/22 2229              Mora Bellman 10/28/22 2306    Gloris Manchester, MD 10/29/22 [phone]

## 2022-10-28 NOTE — ED Provider Triage Note (Signed)
Emergency Medicine Provider Triage Evaluation Note  Sheri Park , a 57 y.o. female  was evaluated in triage.  Pt complains of swelling and pain to her right foot for the last 2 weeks.  Notes that she has developed in the right to both the medial and lateral aspects of the foot with the worst being on the lateral aspect.  She denies any fall, twisting, or injury to the foot.  She denies a history of this.  Also complains of lower back muscle spasms.  States that she has somewhat of a chronic history of these.  Again no fall.  No injury to the back.  No urinary symptoms, bowel or bladder dysfunction, fever, chills, or other red flag symptoms for back pain  Review of Systems  Positive: See HPI Negative: See HPI  Physical Exam  BP (!) 168/99   Pulse 69   Temp 98 F (36.7 C)   Resp 18   LMP 12/15/2019   SpO2 98%  Gen:   Awake, no distress   Resp:  Normal effort lungs clear to auscultation MSK:   Moderate swelling diffusely over the right foot with an approximately 3 cm circular mobile mass over the lateral aspect around the midfoot that is tender to palpation, a smaller similar mass to the medial aspect that is minimally tender, no overlying erythema or other skin changes, 2+ DP and PT pulses, soft compartments, normal sensation, patient able to wiggle toes and move foot without difficulty Other:  No midline spinal tenderness, step-offs, or deformities, mild tenderness over the paraspinous muscles of the lumbar region of the back, patient able to sit up and change positions in wheelchair in triage room without signs of distress  Medical Decision Making  Medically screening exam initiated at 3:16 PM.  Appropriate orders placed.  Sheri Park was informed that the remainder of the evaluation will be completed by another provider, this initial triage assessment does not replace that evaluation, and the importance of remaining in the ED until their evaluation is complete.  Of note,  patient only complained of the right foot pain and swelling as well as lower back muscle spasms on my triage assessment.  She did complain of other vague symptoms including abdominal pain, chest pain, and nausea to nursing staff.  With this, I added lab work as was ordered in addition to EKG and chest x-ray.   Tonette Lederer, PA-C 10/28/22 1519

## 2022-10-28 NOTE — Discharge Instructions (Signed)
You have been seen today for your complaint of right foot pain, chest pain. Your lab work  showed low potassium but was otherwise reassuring. Your imaging showed swelling of the right foot in the area of the cyst but was otherwise reassuring. Your discharge medications include flexeril. This is a muscle relaxer. It may cause drowsiness. Do not drive or operate heavy machinery until you know how it affects you. Follow up with: [facility]. Call the number listed in this packet to schedule a follow up appointment. Please seek immediate medical care if you develop any of the following symptoms: Your chest pain gets worse. You have a cough that gets worse, or you cough up blood. You have severe pain in your abdomen. You faint. You have sudden, unexplained chest discomfort. You have sudden, unexplained discomfort in your arms, back, neck, or jaw. You have shortness of breath at any time. You suddenly start to sweat, or your skin gets clammy. You feel nausea or you vomit. You suddenly feel lightheaded or dizzy. You have severe weakness, or unexplained weakness or fatigue. Your heart begins to beat quickly, or it feels like it is skipping beats. At this time there does not appear to be the presence of an emergent medical condition, however there is always the potential for conditions to change. Please read and follow the below instructions.  Do not take your medicine if  develop an itchy rash, swelling in your mouth or lips, or difficulty breathing; call 911 and seek immediate emergency medical attention if this occurs.  You may review your lab tests and imaging results in their entirety on your MyChart account.  Please discuss all results of fully with your primary care provider and other specialist at your follow-up visit.  Note: Portions of this text may have been transcribed using voice recognition software. Every effort was made to ensure accuracy; however, inadvertent computerized  transcription errors may still be present.

## 2022-11-06 ENCOUNTER — Ambulatory Visit (INDEPENDENT_AMBULATORY_CARE_PROVIDER_SITE_OTHER): Payer: Managed Care, Other (non HMO) | Admitting: Podiatry

## 2022-11-06 ENCOUNTER — Ambulatory Visit: Payer: Managed Care, Other (non HMO)

## 2022-11-06 ENCOUNTER — Encounter: Payer: Self-pay | Admitting: Podiatry

## 2022-11-06 DIAGNOSIS — M778 Other enthesopathies, not elsewhere classified: Secondary | ICD-10-CM

## 2022-11-06 DIAGNOSIS — M2142 Flat foot [pes planus] (acquired), left foot: Secondary | ICD-10-CM

## 2022-11-06 DIAGNOSIS — M67471 Ganglion, right ankle and foot: Secondary | ICD-10-CM

## 2022-11-06 DIAGNOSIS — M2141 Flat foot [pes planus] (acquired), right foot: Secondary | ICD-10-CM

## 2022-11-06 DIAGNOSIS — M76821 Posterior tibial tendinitis, right leg: Secondary | ICD-10-CM | POA: Diagnosis not present

## 2022-11-06 MED ORDER — DEXAMETHASONE SODIUM PHOSPHATE 120 MG/30ML IJ SOLN
4.0000 mg | Freq: Once | INTRAMUSCULAR | Status: AC
  Administered 2022-11-06: 4 mg via INTRA_ARTICULAR

## 2022-11-06 NOTE — Patient Instructions (Signed)
Posterior Tibial Tendinitis Rehab Ask your health care provider which exercises are safe for you. Do exercises exactly as told by your health care provider and adjust them as directed. It is normal to feel mild stretching, pulling, tightness, or discomfort as you do these exercises. Stop right away if you feel sudden pain or your pain gets worse. Do not begin these exercises until told by your health care provider. Stretching and range-of-motion exercises These exercises warm up your muscles and joints and improve the movement and flexibility in your ankle and foot. These exercises may also help to relieve pain. Standing wall calf stretch, knee straight  Stand with your hands against a wall. Extend your left / right leg behind you, and bend your front knee slightly. If directed, place a folded washcloth under the arch of your foot for support. Point the toes of your back foot slightly inward. Keeping your heels on the floor and your back knee straight, shift your weight toward the wall. Do not allow your back to arch. You should feel a gentle stretch in your upper left / right calf. Hold this position for __________ seconds. Repeat __________ times. Complete this exercise __________ times a day. Standing wall calf stretch, knee bent Stand with your hands against a wall. Extend your left / right leg behind you, and bend your front knee slightly. If directed, place a folded washcloth under the arch of your foot for support. Point the toes of your back foot slightly inward. Unlock your back knee so it is bent. Keep your heels on the floor. You should feel a gentle stretch deep in your lower left / right calf. Hold this position for __________ seconds. Repeat __________ times. Complete this exercise __________ times a day. Strengthening exercises These exercises build strength and endurance in your ankle and foot. Endurance is the ability to use your muscles for a long time, even after they get  tired. Ankle inversion with band Secure one end of a rubber exercise band or tubing to a fixed object, such as a table leg or a pole, that will stay still when the band is pulled. Loop the other end of the band around the middle of your left / right foot. Sit on the floor facing the object with your left / right leg extended. The band or tube should be slightly tense when your foot is relaxed. Leading with your big toe, slowly bring your left / right foot and ankle inward, toward your other foot (inversion). Hold this position for __________ seconds. Slowly return your foot to the starting position. Repeat __________ times. Complete this exercise __________ times a day. Towel curls  Sit in a chair on a non-carpeted surface, and put your feet on the floor. Place a towel in front of your feet. If told by your health care provider, add a __________ weight to the end of the towel. Keeping your heel on the floor, put your left / right foot on the towel. Pull the towel toward you by grabbing the towel with your toes and curling them under. Keep your heel on the floor while you do this. Let your toes relax. Grab the towel with your toes again. Keep going until the towel is completely underneath your foot. Repeat __________ times. Complete this exercise __________ times a day. Balance exercise This exercise improves or maintains your balance. Balance is important in preventing falls. Single leg stand Without wearing shoes, stand near a railing or in a doorway. You may hold   on to the railing or door frame as needed for balance. Stand on your left / right foot. Keep your big toe down on the floor and try to keep your arch lifted. If balancing in this position is too easy, try the exercise with your eyes closed or while standing on a pillow. Hold this position for __________ seconds. Repeat __________ times. Complete this exercise __________ times a day. This information is not intended to replace  advice given to you by your health care provider. Make sure you discuss any questions you have with your health care provider. Document Revised: 10/26/2018 Document Reviewed: 09/01/2018 Elsevier Patient Education  2023 [employer]  

## 2022-11-06 NOTE — Progress Notes (Signed)
  Subjective:  Patient ID: Sheri Park, female    DOB: 1966-06-19,   MRN: [id number]  Chief Complaint  Patient presents with   Foot Problem    Soft tissue mass right foot, top of the foot and medial side of the foot, started 6 mths ago, rate of pain 6 out of 10, seen in the ED X-Rays were taken at that time, TX: surgical shoe    57 y.o. female presents for concern of soft tissue mas on her right foot that she relates started 6 months ago.  She works 12 hour shifts and relates pain after this. Denies any major injury but does relates falling back in February. Relates the boot has not been too helpful and the flexeril has not been helpful. Has been written out of work and wants to return. . Denies any other pedal complaints. Denies n/v/f/c.   Past Medical History:  Diagnosis Date   Asthma    Diabetes mellitus without complication    Hypertension     Objective:  Physical Exam: Vascular: DP/PT pulses 2/4 bilateral. CFT <3 seconds. Normal hair growth on digits. No edema.  Skin. No lacerations or abrasions bilateral feet. Small 2 cm soft tissue mass noted to dorsum of right foot. Some tenderness to palpation. Fluctuant consistant with ganglion  Musculoskeletal: MMT 5/5 bilateral lower extremities in DF, PF, Inversion and Eversion. Deceased ROM in DF of ankle joint. Tender about almost the entire foot. Pain to anterior dorsum over the extensor tendons and tarsometatarsal joints medial. Pain along the PT tendon. Sensitive to touch about the entire foot. Pain with DF, PF eversion and inversion. Pes planus noted bilateral.  Neurological: Sensation intact to light touch.   Assessment:   1. Posterior tibial tendon dysfunction (PTTD) of right lower extremity   2. Bilateral pes planus   3. Capsulitis of right foot   4. Ganglion cyst of right foot      Plan:  Patient was evaluated and treated and all questions answered. X-rays reviewed and discussed with patient. No acute fractures or  dislocations some mild degenerative chagnes noted to midfoot.  Reviewed notes from ED.  Discussed PTTD and extensor tendonitis vs capsulitis vs cystic paindiagnosis and treatment options with patient. Not clear exactly where pain coming from.  Stretching exercises discussed and handout dispensed. Will hold off on anti-inflammatories due to other medications she is taking.  Injection offered today procedure below.  Tri-Lock ankle brace dispensed. Discussed if there is no improvement PT/MRI/injection may be an option. Patient to return to clinic in 6 to 8 weeks or sooner if symptoms fail to improve or worsen.   Procedure: Injection Tendon/Ligament Discussed alternatives, risks, complications and verbal consent was obtained.  Location: Right PT tendon and extensor tendon. Skin Prep: Alcohol. Injectate: 1cc 0.5% marcaine plain, 1 cc dexamethasone.  Disposition: Patient tolerated procedure well. Injection site dressed with a band-aid.  Post-injection care was discussed and return precautions discussed.     Louann Sjogren, DPM

## 2022-12-18 ENCOUNTER — Ambulatory Visit (INDEPENDENT_AMBULATORY_CARE_PROVIDER_SITE_OTHER): Payer: Managed Care, Other (non HMO) | Admitting: Podiatry

## 2022-12-18 ENCOUNTER — Encounter: Payer: Self-pay | Admitting: Podiatry

## 2022-12-18 DIAGNOSIS — M76821 Posterior tibial tendinitis, right leg: Secondary | ICD-10-CM | POA: Diagnosis not present

## 2022-12-18 DIAGNOSIS — M778 Other enthesopathies, not elsewhere classified: Secondary | ICD-10-CM

## 2022-12-18 MED ORDER — DEXAMETHASONE SODIUM PHOSPHATE 120 MG/30ML IJ SOLN
4.0000 mg | Freq: Once | INTRAMUSCULAR | Status: AC
  Administered 2022-12-18: 4 mg via INTRA_ARTICULAR

## 2022-12-18 NOTE — Progress Notes (Signed)
  Subjective:  Patient ID: Sheri Park, female    DOB: 1966-04-15,   MRN: [id number]  Chief Complaint  Patient presents with   Foot Pain    Right foot pain not improving     57 y.o. female presents for follow-up of right foot pain. Relates not any better. Relates injeciton helped for a day then pain returned. Relates a lot of issues with swelling and pain while working and work did not Systems analyst note.  Denies any other pedal complaints. Denies n/v/f/c.   Past Medical History:  Diagnosis Date   Asthma    Diabetes mellitus without complication (HCC)    Hypertension     Objective:  Physical Exam: Vascular: DP/PT pulses 2/4 bilateral. CFT <3 seconds. Normal hair growth on digits. No edema.  Skin. No lacerations or abrasions bilateral feet. Small 2 cm soft tissue mass noted to dorsum of right foot. Some tenderness to palpation. Fluctuant consistant with ganglion  Musculoskeletal: MMT 5/5 bilateral lower extremities in DF, PF, Inversion and Eversion. Deceased ROM in DF of ankle joint. Tender about almost the entire foot. Pain to anterior dorsum over the extensor tendons and tarsometatarsal joints medial. Pain along the PT tendon. Sensitive to touch about the entire foot. Pain with DF, PF eversion and inversion. Pes planus noted bilateral.  Neurological: Sensation intact to light touch.   Assessment:   1. Posterior tibial tendon dysfunction (PTTD) of right lower extremity   2. Capsulitis of right foot       Plan:  Patient was evaluated and treated and all questions answered. X-rays reviewed and discussed with patient. No acute fractures or dislocations some mild degenerative chagnes noted to midfoot.  Reviewed notes from ED.  Discussed PTTD and extensor tendonitis vs capsulitis vs cystic paindiagnosis and treatment options with patient. Not clear exactly where pain coming from.  Amb ref to PT. .  Injection offered today procedure below.  Continue trilock Discussed if  there is no improvement PT/MRI/injection may be an option. Patient to return to clinic in 6 to 8 weeks or sooner if symptoms fail to improve or worsen.   Procedure: Injection Tendon/Ligament Discussed alternatives, risks, complications and verbal consent was obtained.  Location: Right PT tendon  Skin Prep: Alcohol. Injectate: 1cc 0.5% marcaine plain, 1 cc dexamethasone.  Disposition: Patient tolerated procedure well. Injection site dressed with a band-aid.  Post-injection care was discussed and return precautions discussed.     Louann Sjogren, DPM

## 2023-01-07 NOTE — Therapy (Deleted)
OUTPATIENT PHYSICAL THERAPY LOWER EXTREMITY EVALUATION   Patient Name: Sheri Park MRN: [id number] DOB:[birth year], 57 y.o., female Today's Date: 01/07/2023  END OF SESSION:   Past Medical History:  Diagnosis Date   Asthma    Diabetes mellitus without complication (HCC)    Hypertension    Past Surgical History:  Procedure Laterality Date   KNEE SURGERY     LEFT HEART CATH AND CORONARY ANGIOGRAPHY N/A 10/20/2016   Procedure: Left Heart Cath and Coronary Angiography;  Surgeon: Orpah Cobb, MD;  Location: [facility];  Service: Cardiovascular;  Laterality: N/A;   TUBAL LIGATION     Patient Active Problem List   Diagnosis Date Noted   Nausea and vomiting    Gastroesophageal reflux disease    Adjustment disorder    Transaminitis 08/08/2021   Sepsis (HCC) 08/05/2020   Atrial fibrillation with rapid ventricular response (HCC) 08/05/2020   Chest pain 08/05/2020   Urticaria 08/05/2020   AKI (acute kidney injury) (HCC) 08/05/2020   Acute coronary syndrome (HCC) 10/18/2016   Chest pain on exertion 10/17/2016    PCP: Orpah Cobb, MD   REFERRING PROVIDER: Louann Sjogren, DPM  REFERRING DIAG: [phone] (ICD-10-CM) - Posterior tibial tendon dysfunction (PTTD) of right lower extremity  THERAPY DIAG:  No diagnosis found.  Rationale for Evaluation and Treatment: Rehabilitation  ONSET DATE: chronic  SUBJECTIVE:   SUBJECTIVE STATEMENT: ***  PERTINENT HISTORY: 57 y.o. female presents for follow-up of right foot pain. Relates not any better. Relates injeciton helped for a day then pain returned. Relates a lot of issues with swelling and pain while working and work did not Systems analyst note.  Denies any other pedal complaints. Denies n/v/f/c.  PAIN:  Are you having pain? {OPRCPAIN:27236}  PRECAUTIONS: None  WEIGHT BEARING RESTRICTIONS: No  FALLS:  Has patient fallen in last 6 months? No  OCCUPATION: ***  PLOF: Independent  PATIENT GOALS: To relive  and manage my fot pain  NEXT MD VISIT: 6-8 weeks  OBJECTIVE:   DIAGNOSTIC FINDINGS: CLINICAL DATA:  Posterior right foot pain. Tibial tendinous function. Bilateral pes planus.   EXAM: RIGHT FOOT COMPLETE - 3+ VIEW   COMPARISON:  Right foot radiographs 10/28/2022   FINDINGS: Unchanged chronic well corticated ossicle at the medial aspect of the great toe interphalangeal joint. Mild first through fifth interphalangeal joint space narrowing and peripheral spurring, mild chronic osteoarthritis. Small plantar and posterior calcaneal heel spurs. Mild-to-moderate dorsal navicular-cuneiform degenerative osteophytosis, unchanged. Mild-to-moderate dorsal forefoot soft tissue swelling, unchanged. Mild to moderate pes planus. No acute fracture or dislocation.   IMPRESSION: 1. Mild-to-moderate dorsal forefoot soft tissue swelling, unchanged. No acute fracture. 2. Mild-to-moderate pes planus.     Electronically Signed   By: Neita Garnet M.D.   On: 11/10/2022 14:42  PATIENT SURVEYS:  FOTO ***  MUSCLE LENGTH: Hamstrings: Right *** deg; Left *** deg   POSTURE:  pes planus B  PALPATION: ***  LOWER EXTREMITY ROM:  {AROM/PROM:27142} ROM Right eval Left eval  Hip flexion    Hip extension    Hip abduction    Hip adduction    Hip internal rotation    Hip external rotation    Knee flexion    Knee extension    Ankle dorsiflexion    Ankle plantarflexion    Ankle inversion    Ankle eversion     (Blank rows = not tested)  LOWER EXTREMITY MMT:  MMT Right eval Left eval  Hip flexion    Hip extension  Hip abduction    Hip adduction    Hip internal rotation    Hip external rotation    Knee flexion    Knee extension    Ankle dorsiflexion    Ankle plantarflexion    Ankle inversion    Ankle eversion     (Blank rows = not tested)  LOWER EXTREMITY SPECIAL TESTS:  {LEspecialtests:26242}  FUNCTIONAL TESTS:  5 times sit to stand: ***  GAIT: Distance walked:  73ftx2 Assistive device utilized: None Level of assistance: Complete Independence Comments: antalgic gait   TODAY'S TREATMENT:                                                                                                                              DATE: ***    PATIENT EDUCATION:  Education details: Discussed eval findings, rehab rationale and POC and patient is in agreement  Person educated: Patient Education method: Explanation Education comprehension: verbalized understanding and needs further education  HOME EXERCISE PROGRAM: ***  ASSESSMENT:  CLINICAL IMPRESSION: Patient is a 57 y.o. female who was seen today for physical therapy evaluation and treatment for R foot and ankle pain.   OBJECTIVE IMPAIRMENTS: {opptimpairments:25111}.   ACTIVITY LIMITATIONS: {activitylimitations:27494}  PERSONAL FACTORS: {Personal factors:25162} are also affecting patient's functional outcome.   REHAB POTENTIAL: {rehabpotential:25112}  CLINICAL DECISION MAKING: Stable/uncomplicated  EVALUATION COMPLEXITY: Low   GOALS: Goals reviewed with patient? No  SHORT TERM GOALS: Target date: *** *** Baseline: Goal status: INITIAL  2.  *** Baseline:  Goal status: INITIAL  3.  *** Baseline:  Goal status: INITIAL  4.  *** Baseline:  Goal status: INITIAL  5.  *** Baseline:  Goal status: INITIAL  6.  *** Baseline:  Goal status: INITIAL  LONG TERM GOALS: Target date: ***  *** Baseline:  Goal status: INITIAL  2.  *** Baseline:  Goal status: INITIAL  3.  *** Baseline:  Goal status: INITIAL  4.  *** Baseline:  Goal status: INITIAL  5.  *** Baseline:  Goal status: INITIAL  6.  *** Baseline:  Goal status: INITIAL   PLAN:  PT FREQUENCY: 1-2x/week  PT DURATION: 6 weeks  PLANNED INTERVENTIONS: Therapeutic exercises, Therapeutic activity, Neuromuscular re-education, Balance training, Gait training, Patient/Family education, Self Care, Joint mobilization,  Stair training, Aquatic Therapy, Dry Needling, Electrical stimulation, Cryotherapy, Moist heat, Manual therapy, and Re-evaluation  PLAN FOR NEXT SESSION: HEP review and update, manual techniques as appropriate, aerobic tasks, ROM and flexibility activities, strengthening and PREs, TPDN, gait and balance training as needed     Hildred Laser, PT 01/07/2023, 3:46 PM

## 2023-01-09 ENCOUNTER — Ambulatory Visit: Payer: Managed Care, Other (non HMO) | Attending: Podiatry

## 2023-02-12 ENCOUNTER — Ambulatory Visit (INDEPENDENT_AMBULATORY_CARE_PROVIDER_SITE_OTHER): Payer: Managed Care, Other (non HMO) | Admitting: Podiatry

## 2023-02-12 DIAGNOSIS — Z91199 Patient's noncompliance with other medical treatment and regimen due to unspecified reason: Secondary | ICD-10-CM

## 2023-02-12 NOTE — Progress Notes (Signed)
No show

## 2023-02-18 ENCOUNTER — Ambulatory Visit
Admission: RE | Admit: 2023-02-18 | Discharge: 2023-02-18 | Disposition: A | Discharge status: Home / Self Care | Payer: Managed Care, Other (non HMO) | Source: Ambulatory Visit | Attending: Cardiovascular Disease | Admitting: Cardiovascular Disease

## 2023-02-18 ENCOUNTER — Other Ambulatory Visit: Payer: Self-pay | Admitting: Cardiovascular Disease

## 2023-02-18 DIAGNOSIS — M25562 Pain in left knee: Secondary | ICD-10-CM

## 2023-02-18 DIAGNOSIS — M25561 Pain in right knee: Secondary | ICD-10-CM

## 2023-02-18 DIAGNOSIS — M170 Bilateral primary osteoarthritis of knee: Secondary | ICD-10-CM | POA: Diagnosis not present

## 2023-02-18 DIAGNOSIS — I480 Paroxysmal atrial fibrillation: Secondary | ICD-10-CM | POA: Diagnosis not present

## 2023-02-18 DIAGNOSIS — I10 Essential (primary) hypertension: Secondary | ICD-10-CM | POA: Diagnosis not present

## 2023-02-18 DIAGNOSIS — M25571 Pain in right ankle and joints of right foot: Secondary | ICD-10-CM | POA: Diagnosis not present

## 2023-03-15 ENCOUNTER — Other Ambulatory Visit: Payer: Self-pay

## 2023-03-15 ENCOUNTER — Emergency Department (HOSPITAL_COMMUNITY)
Admission: EM | Admit: 2023-03-15 | Discharge: 2023-03-15 | Disposition: A | Discharge status: Home / Self Care | Payer: Managed Care, Other (non HMO)

## 2023-03-15 ENCOUNTER — Emergency Department (HOSPITAL_COMMUNITY): Payer: Managed Care, Other (non HMO)

## 2023-03-15 DIAGNOSIS — M48061 Spinal stenosis, lumbar region without neurogenic claudication: Secondary | ICD-10-CM | POA: Diagnosis not present

## 2023-03-15 DIAGNOSIS — W19XXXA Unspecified fall, initial encounter: Secondary | ICD-10-CM

## 2023-03-15 DIAGNOSIS — Z7901 Long term (current) use of anticoagulants: Secondary | ICD-10-CM | POA: Diagnosis not present

## 2023-03-15 DIAGNOSIS — M5136 Other intervertebral disc degeneration, lumbar region: Secondary | ICD-10-CM | POA: Diagnosis not present

## 2023-03-15 DIAGNOSIS — M419 Scoliosis, unspecified: Secondary | ICD-10-CM | POA: Diagnosis not present

## 2023-03-15 DIAGNOSIS — M545 Low back pain, unspecified: Secondary | ICD-10-CM | POA: Diagnosis not present

## 2023-03-15 DIAGNOSIS — W010XXA Fall on same level from slipping, tripping and stumbling without subsequent striking against object, initial encounter: Secondary | ICD-10-CM | POA: Insufficient documentation

## 2023-03-15 MED ORDER — ACETAMINOPHEN 500 MG PO TABS
1000.0000 mg | ORAL_TABLET | Freq: Once | ORAL | Status: AC
  Administered 2023-03-15: 1000 mg via ORAL
  Filled 2023-03-15: qty 2

## 2023-03-15 MED ORDER — OXYCODONE HCL 5 MG PO TABS
10.0000 mg | ORAL_TABLET | Freq: Once | ORAL | Status: AC
  Administered 2023-03-15: 10 mg via ORAL
  Filled 2023-03-15: qty 2

## 2023-03-15 MED ORDER — ONDANSETRON 8 MG PO TBDP
8.0000 mg | ORAL_TABLET | Freq: Once | ORAL | Status: AC
  Administered 2023-03-15: 8 mg via ORAL
  Filled 2023-03-15: qty 1

## 2023-03-15 MED ORDER — ETODOLAC 400 MG PO TABS: 400.0000 mg | ORAL_TABLET | Freq: Two times a day (BID) | ORAL | 0 refills | Status: DC

## 2023-03-15 MED ORDER — LIDOCAINE 5 % EX PTCH: 1.0000 | MEDICATED_PATCH | CUTANEOUS | 0 refills | Status: DC

## 2023-03-15 MED ORDER — METHOCARBAMOL 500 MG PO TABS: 500.0000 mg | ORAL_TABLET | Freq: Two times a day (BID) | ORAL | 0 refills | Status: DC

## 2023-03-15 NOTE — ED Provider Notes (Signed)
[place] EMERGENCY DEPARTMENT AT [facility] Provider Note   CSN: [id number] Arrival date & time: 03/15/23  1411     History  Chief Complaint  Patient presents with   Fall   Back Pain    Sheri Park is a 57 y.o. female.  57 year old female presents today following a fall that occurred this morning.  She states she fell as she was getting out of the house.  She tripped on a wet step causing her to fall backward and striking her back.  No head injury or loss of consciousness.  Not on anticoagulation.  Denies any other injury.  Has been ambulatory since the time of the fall.  She is complaining of low back pain.  Denies any bowel or bladder dysfunction, or saddle anesthesia.  The history is provided by the patient. No language interpreter was used.       Home Medications Prior to Admission medications   Medication Sig Start Date End Date Taking? Authorizing Provider  albuterol (VENTOLIN HFA) 108 (90 Base) MCG/ACT inhaler Inhale 1-2 puffs into the lungs every 6 (six) hours as needed for wheezing or shortness of breath. Patient not taking: Reported on 07/18/2022 08/01/20   Linus Mako B, NP  amLODipine (NORVASC) 5 MG tablet Take 5 mg by mouth daily.    [provider]  apixaban (ELIQUIS) 5 MG TABS tablet Take 1 tablet (5 mg total) by mouth 2 (two) times daily. 08/10/20   Marinda Elk, MD  azithromycin (ZITHROMAX) 250 MG tablet Take 1 tablet (250 mg total) by mouth daily. Take 1 tablet daily for the next 4 days starting on February 6. 08/18/22   Rolan Bucco, MD  blood glucose meter kit and supplies KIT Dispense based on patient and insurance preference. Use up to four times daily as directed. (FOR ICD-9 250.00, 250.01). Patient not taking: Reported on 07/18/2022 08/10/20   Marinda Elk, MD  cyclobenzaprine (FLEXERIL) 10 MG tablet Take 1 tablet (10 mg total) by mouth 2 (two) times daily as needed for muscle spasms. 10/28/22   Schutt, Edsel Petrin,  PA-C  HYDROcodone bit-homatropine (HYCODAN) 5-1.5 MG/5ML syrup Take 5 mLs by mouth every 6 (six) hours as needed for cough. 08/18/22   Rolan Bucco, MD  metoprolol tartrate (LOPRESSOR) 25 MG tablet Take 0.5 tablets (12.5 mg total) by mouth 2 (two) times daily. 08/10/20   Marinda Elk, MD  Multiple Vitamins-Calcium (ONE-A-DAY WOMENS FORMULA) TABS Take 1 tablet by mouth daily with breakfast.    [provider]  omeprazole (PRILOSEC) 20 MG capsule Take 1 capsule (20 mg total) by mouth daily. Patient not taking: Reported on 07/18/2022 08/08/21   Gwyneth Sprout, MD  ondansetron (ZOFRAN) 4 MG tablet Take 1 tablet (4 mg total) by mouth every 6 (six) hours. 08/08/21   Gwyneth Sprout, MD  ondansetron (ZOFRAN) 4 MG tablet Take 1 tablet (4 mg total) by mouth every 8 (eight) hours as needed for nausea or vomiting. 07/04/22   Ellsworth Lennox, PA-C  ferrous sulfate 325 (65 FE) MG tablet Take 1 tablet (325 mg total) by mouth 2 (two) times daily with a meal. Patient not taking: Reported on 05/06/2019 10/21/16 12/22/19  Orpah Cobb, MD  fluticasone (FLOVENT HFA) 110 MCG/ACT inhaler Inhale 1 puff into the lungs 2 (two) times daily. Patient not taking: Reported on 05/06/2019 12/06/14 12/22/19  Linwood Dibbles, MD  potassium chloride (K-DUR) 10 MEQ tablet Take 10 mEq by mouth daily.  12/22/19  [provider]  promethazine (PHENERGAN) 25 MG tablet Take 1 tablet (25 mg total) by mouth every 6 (six) hours as needed for nausea or vomiting. Patient not taking: Reported on 08/20/2018 06/16/18 12/22/19  Rodriguez-Southworth, Nettie Elm, PA-C      Allergies    Other, Peanut-containing drug products, Shellfish allergy, Tape, Codeine, Egg-derived products, Ketorolac tromethamine, Milk-related compounds, Toradol [ketorolac tromethamine], Latex, and Mucinex [guaifenesin er]    Review of Systems   Review of Systems  Constitutional:  Negative for fever.  Genitourinary:  Negative for difficulty urinating and dysuria.   Musculoskeletal:  Positive for back pain.  All other systems reviewed and are negative.   Physical Exam Updated Vital Signs BP (!) 152/98 (BP Location: Left Arm)   Pulse 70   Temp 98.5 F (36.9 C) (Oral)   Resp 18   Ht 5\' 4"  (1.626 m)   Wt (!) 150 kg   LMP 12/15/2019   SpO2 100%   BMI 56.76 kg/m  Physical Exam Vitals and nursing note reviewed.  Constitutional:      General: She is not in acute distress.    Appearance: Normal appearance. She is not ill-appearing.  HENT:     Head: Normocephalic and atraumatic.     Nose: Nose normal.  Eyes:     Conjunctiva/sclera: Conjunctivae normal.  Pulmonary:     Effort: Pulmonary effort is normal. No respiratory distress.  Musculoskeletal:        General: No deformity.     Comments: Tenderness to palpation over the lumbar spine otherwise cervical, thoracic spine without tenderness palpation or step-offs.  Full range of motion of bilateral upper and lower extremities.  Patient ambulated in the emergency department without significant difficulty.  Neurovascularly intact.  Skin:    Findings: No rash.  Neurological:     Mental Status: She is alert.     ED Results / Procedures / Treatments   Labs (all labs ordered are listed, but only abnormal results are displayed) Labs Reviewed - No data to display  EKG None  Radiology CT Lumbar Spine Wo Contrast  Result Date: 03/15/2023 CLINICAL DATA:  Initial evaluation for acute low back pain status post trauma, fall. EXAM: CT LUMBAR SPINE WITHOUT CONTRAST TECHNIQUE: Multidetector CT imaging of the lumbar spine was performed without intravenous contrast administration. Multiplanar CT image reconstructions were also generated. RADIATION DOSE REDUCTION: This exam was performed according to the departmental dose-optimization program which includes automated exposure control, adjustment of the mA and/or kV according to patient size and/or use of iterative reconstruction technique. COMPARISON:   Radiograph from earlier the same day. FINDINGS: Segmentation: Transitional lumbosacral anatomy with sacralization of the L5 vertebral body. Alignment: Mild levoscoliosis. Alignment otherwise normal with preservation of the normal lumbar lordosis. No listhesis. Vertebrae: Vertebral body height maintained. Visualized sacrum and pelvis intact. Sclerosis about the bilateral SI joints without significant erosive changes, suggesting osteitis condensans Ilii. no acute fracture. No discrete or worrisome osseous lesions. Paraspinal and other soft tissues: Paraspinous soft tissues demonstrate no acute finding. Dystrophic calcification within the uterus, likely a small calcified fibroid. Small bilateral fat containing Bochdalek's type hernias noted at the lung bases. Disc levels: T10-11: Degenerative intervertebral disc space narrowing with disc desiccation. No stenosis. T11-12: Negative interspace.  Mild facet hypertrophy.  No stenosis. T12-L1: Negative interspace.  Mild facet hypertrophy.  No stenosis. L1-2: Negative interspace. Mild bilateral facet hypertrophy. No stenosis. L2-3: Mild disc bulge, asymmetric to the left. Mild to moderate facet hypertrophy. No significant spinal stenosis. Foramina remain patent. L3-4: Diffuse disc  bulge. Superimposed right foraminal disc protrusion closely approximates the exiting right L3 nerve root. Moderate left worse than right facet arthrosis. Resultant moderate spinal stenosis. Mild to moderate bilateral L3 foraminal narrowing. L4-5: Diffuse disc bulge. Moderate to advanced bilateral facet arthrosis. No significant spinal stenosis. Moderate bilateral L4 foraminal narrowing. L5-S1: Transitional lumbosacral anatomy with sacralization of the L5 vertebral body. No disc bulge. Mild facet hypertrophy. No stenosis. IMPRESSION: 1. No acute osseous abnormality within the lumbar spine. 2. Transitional lumbosacral anatomy with sacralization of the L5 vertebral body. 3. Right foraminal disc  protrusion at L3-4, closely approximating and potentially affecting the exiting right L3 nerve root. 4. Moderate to advanced lower lumbar facet arthrosis as above, which could contribute to back pain. 5. Sclerosis about the bilateral SI joints without significant erosive changes, suggesting osteitis condensans ilii. Electronically Signed   By: Rise Mu M.D.   On: 03/15/2023 19:16   DG Lumbar Spine 2-3 Views  Result Date: 03/15/2023 CLINICAL DATA:  Slipped and fell, lower back pain, right lower extremity radiculopathy EXAM: LUMBAR SPINE - 2-3 VIEW; SACRUM AND COCCYX - 2+ VIEW COMPARISON:  11/17/2017 FINDINGS: Lumbar spine: Frontal and lateral views are obtained. 5 non-rib-bearing lumbar type vertebral bodies, with sacralization of the L5 vertebral body bilaterally. Mild left convex scoliosis unchanged. Otherwise alignment is anatomic. There are no acute displaced fractures. Disc spaces are well preserved. Diffuse facet hypertrophy, greatest at the lumbosacral junction, not appreciably changed since prior exam. Sacrum/coccyx: Frontal and lateral views are obtained. There are no acute displaced fractures. There is mild asymmetric sclerosis of the left sacroiliac joint. No erosive changes. IMPRESSION: 1. No acute fracture of the lumbar spine or sacrum. 2. Stable diffuse lumbar facet hypertrophic changes, greatest at the lumbosacral junction. 3. Stable asymmetric sclerosis of the sacroiliac joints, which could reflect sacroiliitis. Electronically Signed   By: Sharlet Salina M.D.   On: 03/15/2023 15:57   DG Sacrum/Coccyx  Result Date: 03/15/2023 CLINICAL DATA:  Slipped and fell, lower back pain, right lower extremity radiculopathy EXAM: LUMBAR SPINE - 2-3 VIEW; SACRUM AND COCCYX - 2+ VIEW COMPARISON:  11/17/2017 FINDINGS: Lumbar spine: Frontal and lateral views are obtained. 5 non-rib-bearing lumbar type vertebral bodies, with sacralization of the L5 vertebral body bilaterally. Mild left convex scoliosis  unchanged. Otherwise alignment is anatomic. There are no acute displaced fractures. Disc spaces are well preserved. Diffuse facet hypertrophy, greatest at the lumbosacral junction, not appreciably changed since prior exam. Sacrum/coccyx: Frontal and lateral views are obtained. There are no acute displaced fractures. There is mild asymmetric sclerosis of the left sacroiliac joint. No erosive changes. IMPRESSION: 1. No acute fracture of the lumbar spine or sacrum. 2. Stable diffuse lumbar facet hypertrophic changes, greatest at the lumbosacral junction. 3. Stable asymmetric sclerosis of the sacroiliac joints, which could reflect sacroiliitis. Electronically Signed   By: Sharlet Salina M.D.   On: 03/15/2023 15:57    Procedures Procedures    Medications Ordered in ED Medications  oxyCODONE (Oxy IR/ROXICODONE) immediate release tablet 10 mg (10 mg Oral Given 03/15/23 1815)  acetaminophen (TYLENOL) tablet 1,000 mg (1,000 mg Oral Given 03/15/23 1815)  ondansetron (ZOFRAN-ODT) disintegrating tablet 8 mg (8 mg Oral Given 03/15/23 1815)    ED Course/ Medical Decision Making/ A&P                                 Medical Decision Making Amount and/or Complexity of Data Reviewed Radiology: ordered.  Risk OTC drugs. Prescription drug management.   57 year old female presents today for low back pain following a fall that occurred this morning.  Exam overall reassuring.  Still ambulatory.  Imaging without acute finding.  Does have disc retrusion but this is probably consistent with her history of chronic sciatica.She will follow-up with her PCP.  Lodine, Robaxin, lidocaine patch prescribed.  Patient voices understanding and is in agreement with plan. Patient does have allergy to Toradol but states that she can tolerate Advil, Aleve without issue.  No history of kidney disease.  Final Clinical Impression(s) / ED Diagnoses Final diagnoses:  Acute midline low back pain without sciatica  Fall, initial encounter     Rx / DC Orders ED Discharge Orders          Ordered    etodolac (LODINE) 400 MG tablet  2 times daily        03/15/23 1928    methocarbamol (ROBAXIN) 500 MG tablet  2 times daily        03/15/23 1928    lidocaine (LIDODERM) 5 %  Every 24 hours        03/15/23 1928              Marita Kansas, PA-C 03/15/23 1929    Coral Spikes, DO 03/15/23 2317

## 2023-03-15 NOTE — Discharge Instructions (Signed)
Your workup was reassuring.  I have sent a few medications into the pharmacy for you.  Follow-up with your primary care provider.  For any concerning symptoms return to the emergency room.

## 2023-03-15 NOTE — ED Triage Notes (Signed)
Pt arrived via POV. C/lumbar back pain after slipping on wet stairs and landing on lower back.  AOx4

## 2023-04-06 DIAGNOSIS — I480 Paroxysmal atrial fibrillation: Secondary | ICD-10-CM | POA: Diagnosis not present

## 2023-04-06 DIAGNOSIS — M25562 Pain in left knee: Secondary | ICD-10-CM | POA: Diagnosis not present

## 2023-04-06 DIAGNOSIS — I10 Essential (primary) hypertension: Secondary | ICD-10-CM | POA: Diagnosis not present

## 2023-05-18 DIAGNOSIS — S86111D Strain of other muscle(s) and tendon(s) of posterior muscle group at lower leg level, right leg, subsequent encounter: Secondary | ICD-10-CM | POA: Diagnosis not present

## 2023-06-08 DIAGNOSIS — S86111D Strain of other muscle(s) and tendon(s) of posterior muscle group at lower leg level, right leg, subsequent encounter: Secondary | ICD-10-CM | POA: Diagnosis not present

## 2023-06-14 DIAGNOSIS — S86111D Strain of other muscle(s) and tendon(s) of posterior muscle group at lower leg level, right leg, subsequent encounter: Secondary | ICD-10-CM | POA: Diagnosis not present

## 2023-06-14 DIAGNOSIS — M65971 Unspecified synovitis and tenosynovitis, right ankle and foot: Secondary | ICD-10-CM | POA: Diagnosis not present

## 2023-06-14 DIAGNOSIS — R600 Localized edema: Secondary | ICD-10-CM | POA: Diagnosis not present

## 2023-06-14 DIAGNOSIS — M779 Enthesopathy, unspecified: Secondary | ICD-10-CM | POA: Diagnosis not present

## 2023-06-14 DIAGNOSIS — M25471 Effusion, right ankle: Secondary | ICD-10-CM | POA: Diagnosis not present

## 2023-06-14 DIAGNOSIS — M25474 Effusion, right foot: Secondary | ICD-10-CM | POA: Diagnosis not present

## 2023-06-25 DIAGNOSIS — R059 Cough, unspecified: Secondary | ICD-10-CM | POA: Diagnosis not present

## 2023-06-25 DIAGNOSIS — I10 Essential (primary) hypertension: Secondary | ICD-10-CM | POA: Diagnosis not present

## 2023-06-25 DIAGNOSIS — R079 Chest pain, unspecified: Secondary | ICD-10-CM | POA: Diagnosis not present

## 2023-06-26 DIAGNOSIS — R079 Chest pain, unspecified: Secondary | ICD-10-CM | POA: Diagnosis not present

## 2023-08-11 DIAGNOSIS — S86111D Strain of other muscle(s) and tendon(s) of posterior muscle group at lower leg level, right leg, subsequent encounter: Secondary | ICD-10-CM | POA: Diagnosis not present

## 2023-08-11 DIAGNOSIS — M2141 Flat foot [pes planus] (acquired), right foot: Secondary | ICD-10-CM | POA: Diagnosis not present

## 2023-10-06 DIAGNOSIS — M2141 Flat foot [pes planus] (acquired), right foot: Secondary | ICD-10-CM | POA: Diagnosis not present

## 2023-10-06 DIAGNOSIS — S86111D Strain of other muscle(s) and tendon(s) of posterior muscle group at lower leg level, right leg, subsequent encounter: Secondary | ICD-10-CM | POA: Diagnosis not present

## 2023-10-27 ENCOUNTER — Other Ambulatory Visit: Payer: Self-pay

## 2023-10-27 ENCOUNTER — Observation Stay (HOSPITAL_COMMUNITY)
Admission: RE | Admit: 2023-10-27 | Discharge: 2023-10-30 | Disposition: A | Discharge status: Home / Self Care | Attending: Cardiovascular Disease | Admitting: Cardiovascular Disease

## 2023-10-27 ENCOUNTER — Observation Stay (HOSPITAL_COMMUNITY)

## 2023-10-27 ENCOUNTER — Encounter (HOSPITAL_COMMUNITY): Payer: Self-pay

## 2023-10-27 ENCOUNTER — Encounter (HOSPITAL_COMMUNITY): Payer: Self-pay | Admitting: Cardiovascular Disease

## 2023-10-27 DIAGNOSIS — R079 Chest pain, unspecified: Secondary | ICD-10-CM | POA: Diagnosis not present

## 2023-10-27 DIAGNOSIS — Z79899 Other long term (current) drug therapy: Secondary | ICD-10-CM | POA: Insufficient documentation

## 2023-10-27 DIAGNOSIS — R072 Precordial pain: Secondary | ICD-10-CM | POA: Diagnosis not present

## 2023-10-27 DIAGNOSIS — I249 Acute ischemic heart disease, unspecified: Principal | ICD-10-CM | POA: Diagnosis present

## 2023-10-27 DIAGNOSIS — I10 Essential (primary) hypertension: Secondary | ICD-10-CM | POA: Diagnosis not present

## 2023-10-27 DIAGNOSIS — Z6841 Body Mass Index (BMI) 40.0 and over, adult: Secondary | ICD-10-CM | POA: Insufficient documentation

## 2023-10-27 DIAGNOSIS — J45909 Unspecified asthma, uncomplicated: Secondary | ICD-10-CM | POA: Insufficient documentation

## 2023-10-27 DIAGNOSIS — I480 Paroxysmal atrial fibrillation: Secondary | ICD-10-CM | POA: Diagnosis not present

## 2023-10-27 DIAGNOSIS — Z7901 Long term (current) use of anticoagulants: Secondary | ICD-10-CM | POA: Diagnosis not present

## 2023-10-27 DIAGNOSIS — E119 Type 2 diabetes mellitus without complications: Secondary | ICD-10-CM | POA: Insufficient documentation

## 2023-10-27 DIAGNOSIS — Z91010 Allergy to peanuts: Secondary | ICD-10-CM | POA: Insufficient documentation

## 2023-10-27 DIAGNOSIS — Z91040 Latex allergy status: Secondary | ICD-10-CM | POA: Diagnosis not present

## 2023-10-27 LAB — CBC WITH DIFFERENTIAL/PLATELET
Abs Immature Granulocytes: 0 10*3/uL (ref 0.00–0.07)
Basophils Absolute: 0 10*3/uL (ref 0.0–0.1)
Basophils Relative: 1 %
Eosinophils Absolute: 0.1 10*3/uL (ref 0.0–0.5)
Eosinophils Relative: 2 %
HCT: 36.5 % (ref 36.0–46.0)
Hemoglobin: 12 g/dL (ref 12.0–15.0)
Immature Granulocytes: 0 %
Lymphocytes Relative: 46 %
Lymphs Abs: 1.8 10*3/uL (ref 0.7–4.0)
MCH: 26.9 pg (ref 26.0–34.0)
MCHC: 32.9 g/dL (ref 30.0–36.0)
MCV: 81.8 fL (ref 80.0–100.0)
Monocytes Absolute: 0.4 10*3/uL (ref 0.1–1.0)
Monocytes Relative: 11 %
Neutro Abs: 1.5 10*3/uL — ABNORMAL LOW (ref 1.7–7.7)
Neutrophils Relative %: 40 %
Platelets: 227 10*3/uL (ref 150–400)
RBC: 4.46 MIL/uL (ref 3.87–5.11)
RDW: 13.1 % (ref 11.5–15.5)
WBC: 3.8 10*3/uL — ABNORMAL LOW (ref 4.0–10.5)
nRBC: 0 % (ref 0.0–0.2)

## 2023-10-27 LAB — COMPREHENSIVE METABOLIC PANEL WITH GFR
ALT: 28 U/L (ref 0–44)
AST: 27 U/L (ref 15–41)
Albumin: 3.7 g/dL (ref 3.5–5.0)
Alkaline Phosphatase: 50 U/L (ref 38–126)
Anion gap: 8 (ref 5–15)
BUN: 7 mg/dL (ref 6–20)
CO2: 26 mmol/L (ref 22–32)
Calcium: 9 mg/dL (ref 8.9–10.3)
Chloride: 106 mmol/L (ref 98–111)
Creatinine, Ser: 1.04 mg/dL — ABNORMAL HIGH (ref 0.44–1.00)
GFR, Estimated: >60 mL/min (ref >60)
Glucose, Bld: 119 mg/dL — ABNORMAL HIGH (ref 70–99)
Potassium: 3.6 mmol/L (ref 3.5–5.1)
Sodium: 140 mmol/L (ref 135–145)
Total Bilirubin: 0.6 mg/dL (ref 0.0–1.2)
Total Protein: 7.4 g/dL (ref 6.5–8.1)

## 2023-10-27 LAB — TROPONIN I (HIGH SENSITIVITY): Troponin I (High Sensitivity): 6 ng/L (ref <18)

## 2023-10-27 LAB — HEMOGLOBIN A1C
Hgb A1c MFr Bld: 6.1 % — ABNORMAL HIGH (ref 4.8–5.6)
Mean Plasma Glucose: 128.37 mg/dL

## 2023-10-27 LAB — GLUCOSE, CAPILLARY: Glucose-Capillary: 100 mg/dL — ABNORMAL HIGH (ref 70–99)

## 2023-10-27 LAB — HIV ANTIBODY (ROUTINE TESTING W REFLEX): HIV Screen 4th Generation wRfx: NONREACTIVE

## 2023-10-27 MED ORDER — ROSUVASTATIN CALCIUM 20 MG PO TABS
20.0000 mg | ORAL_TABLET | Freq: Every day | ORAL | Status: DC
  Administered 2023-10-28 – 2023-10-29 (×2): 20 mg via ORAL
  Filled 2023-10-27 (×2): qty 1

## 2023-10-27 MED ORDER — SODIUM CHLORIDE 0.9 % IV SOLN: 250.0000 mL | INTRAVENOUS | Status: AC | PRN

## 2023-10-27 MED ORDER — NITROGLYCERIN 0.4 MG SL SUBL
0.4000 mg | SUBLINGUAL_TABLET | SUBLINGUAL | Status: DC | PRN
  Administered 2023-10-28 (×3): 0.4 mg via SUBLINGUAL
  Filled 2023-10-27 (×2): qty 1

## 2023-10-27 MED ORDER — SODIUM CHLORIDE 0.9% FLUSH: 3.0000 mL | INTRAVENOUS | Status: DC | PRN

## 2023-10-27 MED ORDER — AMLODIPINE BESYLATE 5 MG PO TABS
5.0000 mg | ORAL_TABLET | Freq: Every day | ORAL | Status: DC
  Administered 2023-10-27 – 2023-10-30 (×3): 5 mg via ORAL
  Filled 2023-10-27 (×3): qty 1

## 2023-10-27 MED ORDER — SODIUM CHLORIDE 0.9% FLUSH
3.0000 mL | Freq: Two times a day (BID) | INTRAVENOUS | Status: DC
  Administered 2023-10-27 – 2023-10-30 (×6): 3 mL via INTRAVENOUS

## 2023-10-27 MED ORDER — ACETAMINOPHEN 325 MG PO TABS
650.0000 mg | ORAL_TABLET | ORAL | Status: DC | PRN
  Administered 2023-10-27 – 2023-10-29 (×4): 650 mg via ORAL
  Filled 2023-10-27 (×5): qty 2

## 2023-10-27 MED ORDER — HEPARIN (PORCINE) 25000 UT/250ML-% IV SOLN
1150.0000 [IU]/h | INTRAVENOUS | Status: DC
  Administered 2023-10-27: 1150 [IU]/h via INTRAVENOUS
  Filled 2023-10-27: qty 250

## 2023-10-27 MED ORDER — ONDANSETRON HCL 4 MG/2ML IJ SOLN: 4.0000 mg | Freq: Four times a day (QID) | INTRAMUSCULAR | Status: DC | PRN

## 2023-10-27 MED ORDER — METOPROLOL TARTRATE 25 MG PO TABS
25.0000 mg | ORAL_TABLET | Freq: Two times a day (BID) | ORAL | Status: DC
  Administered 2023-10-27 – 2023-10-30 (×5): 25 mg via ORAL
  Filled 2023-10-27 (×5): qty 1

## 2023-10-27 MED ORDER — HEPARIN BOLUS VIA INFUSION
4000.0000 [IU] | Freq: Once | INTRAVENOUS | Status: AC
  Administered 2023-10-27: 4000 [IU] via INTRAVENOUS
  Filled 2023-10-27: qty 4000

## 2023-10-27 NOTE — Progress Notes (Signed)
 Patient arrived as a direct admit.  No current complaints of chest pain.  Asking to take a shower. Dr. Sharyn Deforest notified of patient's arrival and gave approval for patient to take a shower.

## 2023-10-27 NOTE — H&P (Signed)
 Referring Physician:  [name] is an 58 y.o. female.                       Chief Complaint: Chest pain  HPI: 58 years old black female with PMH of hypertension, type 2 DM, asthma, morbid obesity, atrial fibrillation has chest pain and shortness of breath with activity. Her cardiac cath in 2018 showed normal coronaries. Blood work, EKG and chest x-ray's are pending.  Past Medical History:  Diagnosis Date   Asthma    Diabetes mellitus without complication (HCC)    Hypertension       Past Surgical History:  Procedure Laterality Date   KNEE SURGERY     LEFT HEART CATH AND CORONARY ANGIOGRAPHY N/A 10/20/2016   Procedure: Left Heart Cath and Coronary Angiography;  Surgeon: Pasqual Bone, MD;  Location: [facility];  Service: Cardiovascular;  Laterality: N/A;   TUBAL LIGATION      Family History  Problem Relation Age of Onset   Diabetes Mother    Cancer Mother    Breast cancer Mother 36   Breast cancer Maternal Aunt    Social History:  reports that she has never smoked. She has never used smokeless tobacco. She reports that she does not drink alcohol and does not use drugs.  Allergies:  Allergies  Allergen Reactions   Other Anaphylaxis and Other (See Comments)    ALLERGIC TO ALL NUTS!!!!   Peanut-Containing Drug Products Anaphylaxis and Other (See Comments)    Oil, nuts, anything peanut-related   Shellfish Allergy Anaphylaxis   Tape Rash and Other (See Comments)    NO PAPER TAPE!!!   Codeine Itching   Egg-Derived Products Nausea And Vomiting   Ketorolac Tromethamine Swelling   Milk-Related Compounds Itching   Toradol [Ketorolac Tromethamine] Swelling   Latex Rash and Other (See Comments)    Causes skin blisters   Mucinex [Guaifenesin Er] Rash    Medications Prior to Admission  Medication Sig Dispense Refill   albuterol (VENTOLIN HFA) 108 (90 Base) MCG/ACT inhaler Inhale 1-2 puffs into the lungs every 6 (six) hours as needed for wheezing or shortness of  breath. 8 g 0   amLODipine (NORVASC) 5 MG tablet Take 5 mg by mouth in the morning.     apixaban (ELIQUIS) 5 MG TABS tablet Take 1 tablet (5 mg total) by mouth 2 (two) times daily. 60 tablet 3   hydrochlorothiazide (MICROZIDE) 12.5 MG capsule Take 12.5 mg by mouth in the morning.     metoprolol tartrate (LOPRESSOR) 25 MG tablet Take 0.5 tablets (12.5 mg total) by mouth 2 (two) times daily. 60 tablet 3   Multiple Vitamins-Calcium (ONE-A-DAY WOMENS FORMULA) TABS Take 1 tablet by mouth daily with breakfast.     blood glucose meter kit and supplies KIT Dispense based on patient and insurance preference. Use up to four times daily as directed. (FOR ICD-9 250.00, 250.01). (Patient not taking: Reported on 07/18/2022) 1 each 0    No results found for this or any previous visit (from the past 48 hours). No results found.  Review Of Systems Constitutional: No fever, chills, Chronic weight gain. Eyes: No vision change, wears glasses. No discharge or pain. Ears: No hearing loss, No tinnitus. Respiratory: Positive asthma, COPD, pneumonias,  shortness of breath. No hemoptysis. Cardiovascular: Positive chest pain, palpitation, leg edema. Gastrointestinal: No nausea, vomiting, diarrhea, constipation. No GI bleed. No hepatitis. Genitourinary: No dysuria, hematuria, kidney stone. No incontinance. Neurological: No headache, stroke,  seizures.  Psychiatry: No psych facility admission for anxiety, depression, suicide. No detox. Skin: No rash. Musculoskeletal: No joint pain, fibromyalgia. No neck pain, back pain. Lymphadenopathy: No lymphadenopathy. Hematology: No anemia or easy bruising.   Blood pressure (!) 164/95, pulse 75, temperature 98.1 F (36.7 C), temperature source Oral, resp. rate 20, last menstrual period 12/15/2019, SpO2 97%. There is no height or weight on file to calculate BMI. General appearance: alert, cooperative, appears stated age and no distress Head: Normocephalic, atraumatic. Eyes:  Brown eyes, pink conjunctiva, corneas clear. Neck: No adenopathy, no carotid bruit, no JVD, supple, symmetrical, trachea midline and thyroid not enlarged. Resp: Clear to auscultation bilaterally. Cardio: Regular rate and rhythm, S1, S2 normal, II/VI systolic murmur, no click, rub or gallop GI: Soft, non-tender; bowel sounds normal; no organomegaly. Extremities: Trace edema, no cyanosis or clubbing. Skin: Warm and dry.  Neurologic: Alert and oriented X 3, normal strength. Normal coordination and gait.  Assessment/Plan Acute coronary syndrome Paroxysmal atrial fibrillation Morbid obesity Asthma Hypertension Type 2 DM  Plan: IV heparin Amlodipine, Metoprolol and Rosuvastatin. NM myocardial perfusion test test.  Time spent: Review of old records, Lab, x-rays, EKG, other cardiac tests, examination, discussion with patient/Nurse over 70 minutes.  Darrold Emms, MD  10/27/2023, 9:06 PM

## 2023-10-27 NOTE — Progress Notes (Addendum)
 PHARMACY - ANTICOAGULATION CONSULT NOTE  Pharmacy Consult for ACS Indication: chest pain/ACS  Allergies  Allergen Reactions   Other Anaphylaxis and Other (See Comments)    ALLERGIC TO ALL NUTS!!!!   Peanut-Containing Drug Products Anaphylaxis and Other (See Comments)    Oil, nuts, anything peanut-related   Shellfish Allergy Anaphylaxis   Tape Rash and Other (See Comments)    NO PAPER TAPE!!!   Codeine Itching   Egg-Derived Products Nausea And Vomiting   Ketorolac Tromethamine Swelling   Milk-Related Compounds Itching   Toradol [Ketorolac Tromethamine] Swelling   Latex Rash and Other (See Comments)    Causes skin blisters   Mucinex [Guaifenesin Er] Rash    Patient Measurements: Height: 5\' 4"  (162.6 cm) Weight: (!) 155.4 kg (342 lb 9.5 oz) IBW/kg (Calculated) : 54.7 HEPARIN DW (KG): 94.5  Vital Signs: Temp: 98.1 F (36.7 C) (04/15 2105) Temp Source: Oral (04/15 2105) BP: 164/95 (04/15 2105) Pulse Rate: 75 (04/15 2105)  Labs: No results for input(s): "HGB", "HCT", "PLT", "APTT", "LABPROT", "INR", "HEPARINUNFRC", "HEPRLOWMOCWT", "CREATININE", "CKTOTAL", "CKMB", "TROPONINIHS" in the last 72 hours.  CrCl cannot be calculated (Patient's most recent lab result is older than the maximum 21 days allowed.).   Medical History: Past Medical History:  Diagnosis Date   Asthma    Diabetes mellitus without complication (HCC)    Hypertension     Medications:  Medications Prior to Admission  Medication Sig Dispense Refill Last Dose/Taking   albuterol (VENTOLIN HFA) 108 (90 Base) MCG/ACT inhaler Inhale 1-2 puffs into the lungs every 6 (six) hours as needed for wheezing or shortness of breath. 8 g 0 Past Week   amLODipine (NORVASC) 5 MG tablet Take 5 mg by mouth in the morning.   Past Week   apixaban (ELIQUIS) 5 MG TABS tablet Take 1 tablet (5 mg total) by mouth 2 (two) times daily. 60 tablet 3 Past Week   hydrochlorothiazide (MICROZIDE) 12.5 MG capsule Take 12.5 mg by mouth in  the morning.   Past Week   metoprolol tartrate (LOPRESSOR) 25 MG tablet Take 0.5 tablets (12.5 mg total) by mouth 2 (two) times daily. 60 tablet 3 Past Week   Multiple Vitamins-Calcium (ONE-A-DAY WOMENS FORMULA) TABS Take 1 tablet by mouth daily with breakfast.   Past Week   blood glucose meter kit and supplies KIT Dispense based on patient and insurance preference. Use up to four times daily as directed. (FOR ICD-9 250.00, 250.01). (Patient not taking: Reported on 07/18/2022) 1 each 0    Scheduled:   amLODipine  5 mg Oral Daily   metoprolol tartrate  25 mg Oral BID   [START ON 10/28/2023] rosuvastatin  20 mg Oral q1800   sodium chloride flush  3 mL Intravenous Q12H   Infusions:   sodium chloride     PRN: sodium chloride, acetaminophen, nitroGLYCERIN, ondansetron (ZOFRAN) IV, sodium chloride flush  Assessment: Patient with a history of atrial fibrillation on Eliquis prior to admission, MO, T2DM, HTN. Admitted with chest pain. Pharmacy consulted to dose heparin.   Last Eliquis dose ~ 1 week ago. Patient unsure of when her last dose was exactly.  Hb 12, plt 227.  Goal of Therapy:  Heparin level 0.3-0.7 units/ml aPTT 66-102 seconds Monitor platelets by anticoagulation protocol: Yes   Plan:  Give 4000 units bolus x 1 Start heparin infusion at 1150 units/hr Check anti-Xa level in 6 hours and daily while on heparin Continue to monitor H&H and platelets Will also monitor aPTT given recent Eliquis exposure,  may not need to concomitantly monitor both given remoteness of last dose   Sheri Park 10/27/2023,9:14 PM

## 2023-10-28 ENCOUNTER — Observation Stay (HOSPITAL_COMMUNITY)

## 2023-10-28 DIAGNOSIS — E119 Type 2 diabetes mellitus without complications: Secondary | ICD-10-CM | POA: Diagnosis not present

## 2023-10-28 DIAGNOSIS — Z7901 Long term (current) use of anticoagulants: Secondary | ICD-10-CM | POA: Diagnosis not present

## 2023-10-28 DIAGNOSIS — I249 Acute ischemic heart disease, unspecified: Secondary | ICD-10-CM | POA: Diagnosis not present

## 2023-10-28 DIAGNOSIS — Z91040 Latex allergy status: Secondary | ICD-10-CM | POA: Diagnosis not present

## 2023-10-28 DIAGNOSIS — I10 Essential (primary) hypertension: Secondary | ICD-10-CM | POA: Diagnosis not present

## 2023-10-28 DIAGNOSIS — Z91010 Allergy to peanuts: Secondary | ICD-10-CM | POA: Diagnosis not present

## 2023-10-28 DIAGNOSIS — R072 Precordial pain: Secondary | ICD-10-CM | POA: Diagnosis not present

## 2023-10-28 DIAGNOSIS — I480 Paroxysmal atrial fibrillation: Secondary | ICD-10-CM | POA: Diagnosis not present

## 2023-10-28 DIAGNOSIS — Z79899 Other long term (current) drug therapy: Secondary | ICD-10-CM | POA: Diagnosis not present

## 2023-10-28 DIAGNOSIS — J45909 Unspecified asthma, uncomplicated: Secondary | ICD-10-CM | POA: Diagnosis not present

## 2023-10-28 DIAGNOSIS — Z6841 Body Mass Index (BMI) 40.0 and over, adult: Secondary | ICD-10-CM | POA: Diagnosis not present

## 2023-10-28 LAB — CBC
HCT: 34 % — ABNORMAL LOW (ref 36.0–46.0)
Hemoglobin: 11.3 g/dL — ABNORMAL LOW (ref 12.0–15.0)
MCH: 26.9 pg (ref 26.0–34.0)
MCHC: 33.2 g/dL (ref 30.0–36.0)
MCV: 81 fL (ref 80.0–100.0)
Platelets: 209 10*3/uL (ref 150–400)
RBC: 4.2 MIL/uL (ref 3.87–5.11)
RDW: 13.2 % (ref 11.5–15.5)
WBC: 3.9 10*3/uL — ABNORMAL LOW (ref 4.0–10.5)
nRBC: 0 % (ref 0.0–0.2)

## 2023-10-28 LAB — TROPONIN I (HIGH SENSITIVITY): Troponin I (High Sensitivity): 7 ng/L (ref <18)

## 2023-10-28 LAB — GLUCOSE, CAPILLARY
Glucose-Capillary: 102 mg/dL — ABNORMAL HIGH (ref 70–99)
Glucose-Capillary: 113 mg/dL — ABNORMAL HIGH (ref 70–99)

## 2023-10-28 LAB — ECHOCARDIOGRAM COMPLETE
Area-P 1/2: 2.56 cm2
Height: 64 in
S' Lateral: 3 cm
Weight: 5456.83 [oz_av]

## 2023-10-28 LAB — BASIC METABOLIC PANEL WITH GFR
Anion gap: 13 (ref 5–15)
BUN: 7 mg/dL (ref 6–20)
CO2: 22 mmol/L (ref 22–32)
Calcium: 8.7 mg/dL — ABNORMAL LOW (ref 8.9–10.3)
Chloride: 105 mmol/L (ref 98–111)
Creatinine, Ser: 0.84 mg/dL (ref 0.44–1.00)
GFR, Estimated: >60 mL/min (ref >60)
Glucose, Bld: 100 mg/dL — ABNORMAL HIGH (ref 70–99)
Potassium: 3.6 mmol/L (ref 3.5–5.1)
Sodium: 140 mmol/L (ref 135–145)

## 2023-10-28 LAB — APTT
aPTT: 44 s — ABNORMAL HIGH (ref 24–36)
aPTT: 54 s — ABNORMAL HIGH (ref 24–36)

## 2023-10-28 LAB — HEPARIN LEVEL (UNFRACTIONATED)
Heparin Unfractionated: 0.13 [IU]/mL — ABNORMAL LOW (ref 0.30–0.70)
Heparin Unfractionated: 0.34 [IU]/mL (ref 0.30–0.70)

## 2023-10-28 LAB — LIPID PANEL
Cholesterol: 147 mg/dL (ref 0–200)
HDL: 37 mg/dL — ABNORMAL LOW (ref >40)
LDL Cholesterol: 95 mg/dL (ref 0–99)
Total CHOL/HDL Ratio: 4 ratio
Triglycerides: 75 mg/dL (ref <150)
VLDL: 15 mg/dL (ref 0–40)

## 2023-10-28 MED ORDER — POTASSIUM CHLORIDE CRYS ER 10 MEQ PO TBCR
10.0000 meq | EXTENDED_RELEASE_TABLET | Freq: Every day | ORAL | Status: DC
Start: 2023-10-28 — End: 2023-10-30
  Administered 2023-10-28 – 2023-10-30 (×3): 10 meq via ORAL
  Filled 2023-10-28 (×3): qty 1

## 2023-10-28 MED ORDER — TECHNETIUM TC 99M TETROFOSMIN IV KIT
29.0000 | PACK | Freq: Once | INTRAVENOUS | Status: AC | PRN
  Administered 2023-10-28: 29 via INTRAVENOUS

## 2023-10-28 MED ORDER — REGADENOSON 0.4 MG/5ML IV SOLN
0.4000 mg | Freq: Once | INTRAVENOUS | Status: AC
Start: 2023-10-28 — End: 2023-10-28
  Administered 2023-10-28: 0.4 mg via INTRAVENOUS
  Filled 2023-10-28: qty 5

## 2023-10-28 MED ORDER — HEPARIN BOLUS VIA INFUSION
2500.0000 [IU] | Freq: Once | INTRAVENOUS | Status: AC
  Administered 2023-10-28: 2500 [IU] via INTRAVENOUS
  Filled 2023-10-28: qty 2500

## 2023-10-28 MED ORDER — REGADENOSON 0.4 MG/5ML IV SOLN
INTRAVENOUS | Status: AC
  Filled 2023-10-28: qty 5

## 2023-10-28 MED ORDER — HEPARIN (PORCINE) 25000 UT/250ML-% IV SOLN
1550.0000 [IU]/h | INTRAVENOUS | Status: DC
  Administered 2023-10-28: 1450 [IU]/h via INTRAVENOUS
  Administered 2023-10-29 (×2): 1550 [IU]/h via INTRAVENOUS
  Filled 2023-10-28 (×3): qty 250

## 2023-10-28 NOTE — Progress Notes (Signed)
 PHARMACY - ANTICOAGULATION CONSULT NOTE  Pharmacy Consult for ACS Indication: chest pain/ACS  Allergies  Allergen Reactions   Other Anaphylaxis and Other (See Comments)    ALLERGIC TO ALL NUTS!!!!   Peanut-Containing Drug Products Anaphylaxis and Other (See Comments)    Oil, nuts, anything peanut-related   Shellfish Allergy Anaphylaxis   Tape Rash and Other (See Comments)    NO PAPER TAPE!!!   Codeine Itching   Egg-Derived Products Nausea And Vomiting   Ketorolac Tromethamine Swelling   Milk-Related Compounds Itching   Toradol [Ketorolac Tromethamine] Swelling   Latex Rash and Other (See Comments)    Causes skin blisters   Mucinex [Guaifenesin Er] Rash    Patient Measurements: Height: 5\' 4"  (162.6 cm) Weight: (!) 154.7 kg (341 lb 0.8 oz) IBW/kg (Calculated) : 54.7 HEPARIN DW (KG): 94.5  Vital Signs: Temp: 98.4 F (36.9 C) (04/16 1921) Temp Source: Oral (04/16 1921) BP: 161/82 (04/16 2040) Pulse Rate: 68 (04/16 2040)  Labs: Recent Labs    10/27/23 2121 10/27/23 2347 10/28/23 0725 10/28/23 2107  HGB 12.0  --  11.3*  --   HCT 36.5  --  34.0*  --   PLT 227  --  209  --   APTT  --   --  44* 54*  HEPARINUNFRC  --   --  0.13* 0.34  CREATININE 1.04*  --  0.84  --   TROPONINIHS 6 7  --   --     Estimated Creatinine Clearance: 111.8 mL/min (by C-G formula based on SCr of 0.84 mg/dL).   Assessment: Patient with a history of atrial fibrillation on Eliquis prior to admission, MO, T2DM, HTN. Admitted with chest pain. Pharmacy consulted to dose heparin.   Last Eliquis dose ~ 1 week ago. Patient unsure of when her last dose was exactly.  Hb 12, plt 227.  Heparin level 0.34 - therapeutic (?still affected by Eliquis), aPTT 54 sec (subtherapeutic) on infusion at 1450 units/hr. No issues with line or bleeding reported per RN. Will utilize heparin level from now on for monitoring.  Goal of Therapy:  Heparin level 0.3-0.7 units/ml Monitor platelets by anticoagulation  protocol: Yes   Plan:  Increase Heparin rate slighlty to 1550 units/hr to keep in goal range F/u a.m. heparin level to confirm therapeutic  Enrigue Harvard, PharmD, BCPS Please see amion for complete clinical pharmacist phone list 10/28/2023,10:17 PM

## 2023-10-28 NOTE — Progress Notes (Signed)
 Ref: Orpah Cobb, MD   Subjective:  Awake. VS stable. Finished day 1 of 2 day NM myocardial perfusion test. Patient had chest pain without significant EKG changes of ischemia post Lexiscan infusion.   Objective:  Vital Signs in the last 24 hours: Temp:  [97.8 F (36.6 C)-98.4 F (36.9 C)] 97.8 F (36.6 C) (04/16 1430) Pulse Rate:  [58-94] 62 (04/16 1430) Cardiac Rhythm: Normal sinus rhythm;Heart block (04/16 0815) Resp:  [14-20] 19 (04/16 1430) BP: (107-165)/(47-95) 130/85 (04/16 1430) SpO2:  [92 %-97 %] 96 % (04/16 1430) Weight:  [154.7 kg-155.4 kg] 154.7 kg (04/16 0627)  Physical Exam: BP Readings from Last 1 Encounters:  10/28/23 130/85     Wt Readings from Last 1 Encounters:  10/28/23 (!) 154.7 kg    Weight change:  Body mass index is 58.54 kg/m. HEENT: [name], Eyes-Brown, Conjunctiva-Pink, Sclera-Non-icteric Neck: No JVD, No bruit, Trachea midline. Lungs:  Clear, Bilateral. Mild left precordial tenderness on palpation. Cardiac:  Regular rhythm, normal S1 and S2, no S3. II/VI systolic murmur. Abdomen:  Soft, non-tender. BS present. Extremities:  Trace edema present. No cyanosis. No clubbing. CNS: AxOx3, Cranial nerves grossly intact, moves all 4 extremities.  Skin: Warm and dry.   Intake/Output from previous day: 04/15 0701 - 04/16 0700 In: 125.3 [I.V.:125.3] Out: -     Lab Results: BMET    Component Value Date/Time   NA 140 10/28/2023 0725   NA 140 10/27/2023 2121   NA 141 10/28/2022 1516   K 3.6 10/28/2023 0725   K 3.6 10/27/2023 2121   K 3.2 (L) 10/28/2022 1516   CL 105 10/28/2023 0725   CL 106 10/27/2023 2121   CL 102 10/28/2022 1516   CO2 22 10/28/2023 0725   CO2 26 10/27/2023 2121   CO2 29 10/28/2022 1516   GLUCOSE 100 (H) 10/28/2023 0725   GLUCOSE 119 (H) 10/27/2023 2121   GLUCOSE 97 10/28/2022 1516   BUN 7 10/28/2023 0725   BUN 7 10/27/2023 2121   BUN 7 10/28/2022 1516   CREATININE 0.84 10/28/2023 0725   CREATININE 1.04 (H) 10/27/2023  2121   CREATININE 0.93 10/28/2022 1516   CALCIUM 8.7 (L) 10/28/2023 0725   CALCIUM 9.0 10/27/2023 2121   CALCIUM 9.0 10/28/2022 1516   GFRNONAA >60 10/28/2023 0725   GFRNONAA >60 10/27/2023 2121   GFRNONAA >60 10/28/2022 1516   GFRAA >60 05/06/2019 1723   GFRAA >60 01/08/2018 0131   GFRAA >60 10/21/2016 0504   CBC    Component Value Date/Time   WBC 3.9 (L) 10/28/2023 0725   RBC 4.20 10/28/2023 0725   HGB 11.3 (L) 10/28/2023 0725   HCT 34.0 (L) 10/28/2023 0725   PLT 209 10/28/2023 0725   MCV 81.0 10/28/2023 0725   MCH 26.9 10/28/2023 0725   MCHC 33.2 10/28/2023 0725   RDW 13.2 10/28/2023 0725   LYMPHSABS 1.8 10/27/2023 2121   MONOABS 0.4 10/27/2023 2121   EOSABS 0.1 10/27/2023 2121   BASOSABS 0.0 10/27/2023 2121   HEPATIC Function Panel Recent Labs    10/27/23 2121  PROT 7.4  ALBUMIN 3.7  AST 27  ALT 28  ALKPHOS 50   HEMOGLOBIN A1C Lab Results  Component Value Date   MPG 128.37 10/27/2023   CARDIAC ENZYMES Lab Results  Component Value Date   TROPONINI <0.03 10/17/2016   TROPONINI <0.03 10/17/2016   TROPONINI <0.03 10/17/2016   BNP No results for input(s): "PROBNP" in the last 8760 hours. TSH No results for input(s): "TSH" in  the last 8760 hours. CHOLESTEROL Recent Labs    10/28/23 0725  CHOL 147    Scheduled Meds:  amLODipine  5 mg Oral Daily   metoprolol tartrate  25 mg Oral BID   potassium chloride  10 mEq Oral Daily   rosuvastatin  20 mg Oral q1800   sodium chloride flush  3 mL Intravenous Q12H   Continuous Infusions:  sodium chloride     heparin 1,450 Units/hr (10/28/23 1440)   PRN Meds:.sodium chloride, acetaminophen, nitroGLYCERIN, ondansetron (ZOFRAN) IV, sodium chloride flush  Assessment/Plan: Acute coronary syndrome Paroxysmal atrial fibrillation Morbid obesity Asthma Hypertension Type 2 DM  Plan: Continue evaluation for chest pain/stress test.     LOS: 1 day   Time spent including chart review, lab review, examination,  discussion with patient/Nurse/Tech : 30 min   Sheri Bone  MD  10/28/2023, 5:15 PM

## 2023-10-28 NOTE — TOC Initial Note (Signed)
 Transition of Care [facility]) - Initial/Assessment Note    Patient Details  Name: Sheri Park MRN: [id number] Date of Birth: 11-19-1965  [facility]) CM/SW Contact:    Juliane Och, LCSW Phone Number: 10/28/2023, 4:02 PM  Clinical Narrative:                  4:02 PM CSW introduced self and role to patient at bedside. CSW followed up on SDOH needs (food, housing, transportation). Patient declined housing resources as rent and utilities are not currently in her name but accepted CSW offer of food stamps application. Patient stated that she is aware of local food pantries and that adult children could provide transportation at discharge. Patient declined transportation resources as she stated that adult children could provide transportation to medical/non-medical appointments. Patient stated that she has lost her insurance card and expressed interest in a copy. CSW provided patient with food stamps application and insurance contact information in efforts to obtain replacement insurance card. Patient informed CSW that she resides at home alone in a one-story home.  Expected Discharge Plan: Home/Self Care Barriers to Discharge: Continued Medical Work up   Patient Goals and CMS Choice Patient states their goals for this hospitalization and ongoing recovery are:: to return home          Expected Discharge Plan and Services       Living arrangements for the past 2 months: Single Family Home                                      Prior Living Arrangements/Services Living arrangements for the past 2 months: Single Family Home Lives with:: Self Patient language and need for interpreter reviewed:: Yes Do you feel safe going back to the place where you live?: Yes            Criminal Activity/Legal Involvement Pertinent to Current Situation/Hospitalization: No - Comment as needed  Activities of Daily Living   ADL Screening (condition at time of  admission) Independently performs ADLs?: Yes (appropriate for developmental age) Is the patient deaf or have difficulty hearing?: No Does the patient have difficulty seeing, even when wearing glasses/contacts?: Yes (glasses) Does the patient have difficulty concentrating, remembering, or making decisions?: No  Permission Sought/Granted Permission sought to share information with : Family Supports Permission granted to share information with : No (Contact information on chart)  Share Information with NAME: Sallie Craze     Permission granted to share info w Relationship: Son  Permission granted to share info w Contact Information: [phone]  Emotional Assessment Appearance:: Appears stated age Attitude/Demeanor/Rapport: Engaged Affect (typically observed): Accepting, Adaptable, Pleasant, Stable, Calm, Appropriate Orientation: : Oriented to Self, Oriented to Place, Oriented to  Time, Oriented to Situation Alcohol / Substance Use: Not Applicable Psych Involvement: No (comment)  Admission diagnosis:  Acute coronary syndrome [facility] [I24.9] Patient Active Problem List   Diagnosis Date Noted   Nausea and vomiting    Gastroesophageal reflux disease    Adjustment disorder    Transaminitis 08/08/2021   Sepsis (HCC) 08/05/2020   Atrial fibrillation with rapid ventricular response (HCC) 08/05/2020   Chest pain 08/05/2020   Urticaria 08/05/2020   AKI (acute kidney injury) (HCC) 08/05/2020   Acute coronary syndrome (HCC) 10/18/2016   Chest pain on exertion 10/17/2016   PCP:  Pasqual Bone, MD Pharmacy:   [facility] [phone] - [place], [place] - [address] [facility] [address] [place] [address] Phone: [phone] Fax: [phone]  [facility] [address] [place] [address] Phone: [phone] Fax: [phone]     Social Drivers of Health (SDOH) Social History: SDOH Screenings   Food  Insecurity: Food Insecurity Present (10/27/2023)  Housing: High Risk (10/27/2023)  Transportation Needs: Unmet Transportation Needs (10/27/2023)  Utilities: Not At Risk (10/27/2023)  Social Connections: Unknown (10/27/2023)  Tobacco Use: Low Risk  (10/27/2023)  Recent Concern: Tobacco Use - Medium Risk (08/11/2023)   Received from [facility]   SDOH Interventions: Food Insecurity Interventions: Assist with SNAP Application, Inpatient TOC Housing Interventions: Patient Declined Transportation Interventions: Patient Resources (Friends/Family)   Readmission Risk Interventions     No data to display

## 2023-10-28 NOTE — Progress Notes (Addendum)
 PHARMACY - ANTICOAGULATION CONSULT NOTE  Pharmacy Consult for ACS Indication: chest pain/ACS  Allergies  Allergen Reactions   Other Anaphylaxis and Other (See Comments)    ALLERGIC TO ALL NUTS!!!!   Peanut-Containing Drug Products Anaphylaxis and Other (See Comments)    Oil, nuts, anything peanut-related   Shellfish Allergy Anaphylaxis   Tape Rash and Other (See Comments)    NO PAPER TAPE!!!   Codeine Itching   Egg-Derived Products Nausea And Vomiting   Ketorolac Tromethamine Swelling   Milk-Related Compounds Itching   Toradol [Ketorolac Tromethamine] Swelling   Latex Rash and Other (See Comments)    Causes skin blisters   Mucinex [Guaifenesin Er] Rash    Patient Measurements: Height: 5\' 4"  (162.6 cm) Weight: (!) 154.7 kg (341 lb 0.8 oz) IBW/kg (Calculated) : 54.7 HEPARIN DW (KG): 94.5  Vital Signs: Temp: 98.4 F (36.9 C) (04/16 0807) Temp Source: Oral (04/16 0807) BP: 133/65 (04/16 0807) Pulse Rate: 60 (04/16 0807)  Labs: Recent Labs    10/27/23 2121 10/27/23 2347 10/28/23 0725  HGB 12.0  --  11.3*  HCT 36.5  --  34.0*  PLT 227  --  209  APTT  --   --  44*  HEPARINUNFRC  --   --  0.13*  CREATININE 1.04*  --  0.84  TROPONINIHS 6 7  --     Estimated Creatinine Clearance: 111.8 mL/min (by C-G formula based on SCr of 0.84 mg/dL).   Medical History: Past Medical History:  Diagnosis Date   Asthma    Diabetes mellitus without complication (HCC)    Hypertension     Medications:  Medications Prior to Admission  Medication Sig Dispense Refill Last Dose/Taking   albuterol (VENTOLIN HFA) 108 (90 Base) MCG/ACT inhaler Inhale 1-2 puffs into the lungs every 6 (six) hours as needed for wheezing or shortness of breath. 8 g 0 Past Week   amLODipine (NORVASC) 5 MG tablet Take 5 mg by mouth in the morning.   Past Week   apixaban (ELIQUIS) 5 MG TABS tablet Take 1 tablet (5 mg total) by mouth 2 (two) times daily. 60 tablet 3 Past Week   hydrochlorothiazide (MICROZIDE)  12.5 MG capsule Take 12.5 mg by mouth in the morning.   Past Week   metoprolol tartrate (LOPRESSOR) 25 MG tablet Take 0.5 tablets (12.5 mg total) by mouth 2 (two) times daily. 60 tablet 3 Past Week   Multiple Vitamins-Calcium (ONE-A-DAY WOMENS FORMULA) TABS Take 1 tablet by mouth daily with breakfast.   Past Week   blood glucose meter kit and supplies KIT Dispense based on patient and insurance preference. Use up to four times daily as directed. (FOR ICD-9 250.00, 250.01). (Patient not taking: Reported on 07/18/2022) 1 each 0    Scheduled:   amLODipine  5 mg Oral Daily   metoprolol tartrate  25 mg Oral BID   potassium chloride  10 mEq Oral Daily   rosuvastatin  20 mg Oral q1800   sodium chloride flush  3 mL Intravenous Q12H   Infusions:   sodium chloride     heparin 1,150 Units/hr (10/27/23 2232)   PRN: sodium chloride, acetaminophen, nitroGLYCERIN, ondansetron (ZOFRAN) IV, sodium chloride flush  Assessment: Patient with a history of atrial fibrillation on Eliquis prior to admission, MO, T2DM, HTN. Admitted with chest pain. Pharmacy consulted to dose heparin.   Last Eliquis dose ~ 1 week ago. Patient unsure of when her last dose was exactly.  Hb 12, plt 227.  4/16:  PTT  44 and HL 0.13 subtherapeutic on heparin 1150 ut/hr.  Last dose of Eliquis PTA was "past week". Patient reported out of her meds and had gotten sick and could not keep anything down PTA.  Hgb 12>11.3 , pltc wnl . No bleeding noted.   Goal of Therapy:  Heparin level 0.3-0.7 units/ml aPTT 66-102 seconds Monitor platelets by anticoagulation protocol: Yes   Plan:  Give heparin 2500 units IV bolus x1 Increase Heparin rate to 1450 units/hr Check 6 hour aPTT and HL Daily aPTT, HL, and CBC.  May be able to stop aPTTs soon. Will also monitor aPTT given recent Eliquis exposure, may not need to concomitantly monitor both given remoteness of last dose    Alisa Irish, RPh Clinical Pharmacist 10/28/2023,11:06 AM

## 2023-10-28 NOTE — Progress Notes (Signed)
 Dr. Sharyn Deforest at bedside (in nuc med)

## 2023-10-28 NOTE — Progress Notes (Signed)
 Echocardiogram 2D Echocardiogram has been performed.  Emmaline Haring Yitzel Shasteen RDCS 10/28/2023, 8:58 AM

## 2023-10-29 DIAGNOSIS — R079 Chest pain, unspecified: Secondary | ICD-10-CM | POA: Diagnosis not present

## 2023-10-29 DIAGNOSIS — J45909 Unspecified asthma, uncomplicated: Secondary | ICD-10-CM | POA: Diagnosis not present

## 2023-10-29 DIAGNOSIS — Z6841 Body Mass Index (BMI) 40.0 and over, adult: Secondary | ICD-10-CM | POA: Diagnosis not present

## 2023-10-29 DIAGNOSIS — R072 Precordial pain: Secondary | ICD-10-CM | POA: Diagnosis not present

## 2023-10-29 DIAGNOSIS — I209 Angina pectoris, unspecified: Secondary | ICD-10-CM | POA: Diagnosis not present

## 2023-10-29 DIAGNOSIS — Z7901 Long term (current) use of anticoagulants: Secondary | ICD-10-CM | POA: Diagnosis not present

## 2023-10-29 DIAGNOSIS — I480 Paroxysmal atrial fibrillation: Secondary | ICD-10-CM | POA: Diagnosis not present

## 2023-10-29 DIAGNOSIS — Z91040 Latex allergy status: Secondary | ICD-10-CM | POA: Diagnosis not present

## 2023-10-29 DIAGNOSIS — Z79899 Other long term (current) drug therapy: Secondary | ICD-10-CM | POA: Diagnosis not present

## 2023-10-29 DIAGNOSIS — I249 Acute ischemic heart disease, unspecified: Secondary | ICD-10-CM | POA: Diagnosis not present

## 2023-10-29 DIAGNOSIS — Z91010 Allergy to peanuts: Secondary | ICD-10-CM | POA: Diagnosis not present

## 2023-10-29 DIAGNOSIS — E119 Type 2 diabetes mellitus without complications: Secondary | ICD-10-CM | POA: Diagnosis not present

## 2023-10-29 DIAGNOSIS — I10 Essential (primary) hypertension: Secondary | ICD-10-CM | POA: Diagnosis not present

## 2023-10-29 LAB — CBC
HCT: 35.7 % — ABNORMAL LOW (ref 36.0–46.0)
Hemoglobin: 11.7 g/dL — ABNORMAL LOW (ref 12.0–15.0)
MCH: 26.7 pg (ref 26.0–34.0)
MCHC: 32.8 g/dL (ref 30.0–36.0)
MCV: 81.5 fL (ref 80.0–100.0)
Platelets: 249 10*3/uL (ref 150–400)
RBC: 4.38 MIL/uL (ref 3.87–5.11)
RDW: 13 % (ref 11.5–15.5)
WBC: 4.5 10*3/uL (ref 4.0–10.5)
nRBC: 0 % (ref 0.0–0.2)

## 2023-10-29 LAB — HEPARIN LEVEL (UNFRACTIONATED): Heparin Unfractionated: 0.4 [IU]/mL (ref 0.30–0.70)

## 2023-10-29 LAB — LIPOPROTEIN A (LPA): Lipoprotein (a): 96.8 nmol/L — ABNORMAL HIGH (ref <75.0)

## 2023-10-29 MED ORDER — TECHNETIUM TC 99M TETROFOSMIN IV KIT
32.0000 | PACK | Freq: Once | INTRAVENOUS | Status: AC | PRN
  Administered 2023-10-29: 32 via INTRAVENOUS

## 2023-10-29 MED ORDER — OXYCODONE HCL 5 MG PO TABS
5.0000 mg | ORAL_TABLET | Freq: Four times a day (QID) | ORAL | Status: DC | PRN
  Administered 2023-10-29 – 2023-10-30 (×2): 5 mg via ORAL
  Filled 2023-10-29 (×2): qty 1

## 2023-10-29 NOTE — Progress Notes (Signed)
 PHARMACY - ANTICOAGULATION CONSULT NOTE  Pharmacy Consult for ACS Indication: chest pain/ACS  Allergies  Allergen Reactions   Other Anaphylaxis and Other (See Comments)    ALLERGIC TO ALL NUTS!!!!   Peanut-Containing Drug Products Anaphylaxis and Other (See Comments)    Oil, nuts, anything peanut-related   Shellfish Allergy Anaphylaxis   Tape Rash and Other (See Comments)    NO PAPER TAPE!!!   Codeine Itching   Egg-Derived Products Nausea And Vomiting   Ketorolac Tromethamine Swelling   Milk-Related Compounds Itching   Toradol [Ketorolac Tromethamine] Swelling   Latex Rash and Other (See Comments)    Causes skin blisters   Mucinex [Guaifenesin Er] Rash    Patient Measurements: Height: 5\' 4"  (162.6 cm) Weight: (!) 158.9 kg (350 lb 5 oz) IBW/kg (Calculated) : 54.7 HEPARIN DW (KG): 94.5  Vital Signs: Temp: 97.8 F (36.6 C) (04/17 1145) Temp Source: Oral (04/17 1145) BP: 142/70 (04/17 1145) Pulse Rate: 65 (04/17 0742)  Labs: Recent Labs    10/27/23 2121 10/27/23 2347 10/28/23 0725 10/28/23 2107 10/29/23 0228  HGB 12.0  --  11.3*  --  11.7*  HCT 36.5  --  34.0*  --  35.7*  PLT 227  --  209  --  249  APTT  --   --  44* 54*  --   HEPARINUNFRC  --   --  0.13* 0.34 0.40  CREATININE 1.04*  --  0.84  --   --   TROPONINIHS 6 7  --   --   --     Estimated Creatinine Clearance: 113.8 mL/min (by C-G formula based on SCr of 0.84 mg/dL).   Assessment: Patient with a history of atrial fibrillation on Eliquis prior to admission, MO, T2DM, HTN. Admitted with chest pain. Pharmacy consulted to dose heparin.   Last Eliquis dose ~ 1 week ago. Patient unsure of when her last dose was exactly. >Okay to utilize heparin level for monitoring as HLs correlating with aPTT  on 10/28/23.  Current Heparin level 0.40  on heparin infusion 1550 units/hr.  Hgb 12> 11s stable , pltc wnl.No bleeding noted.  PTA Eliquis is on hold.    Goal of Therapy:  Heparin level 0.3-0.7 units/ml Monitor  platelets by anticoagulation protocol: Yes   Plan:  Continue Heparin 1550 units/hr  Daily HL, CBC F/u restart of Apixaban (on PTA)   Alisa Irish, RPh Clinical Pharmacist [phone] until 3p Please see amion for complete clinical pharmacist phone list 10/29/2023,11:47 AM

## 2023-10-29 NOTE — Progress Notes (Signed)
 Ref: Pasqual Bone, MD   Subjective:  C/O left sided chest pain with breathing and palpation. Also has under the left breast discomfort. Discussed stress test finding of normal LV systolic function in area of possible ischemia.  Objective:  Vital Signs in the last 24 hours: Temp:  [97.8 F (36.6 C)-98.5 F (36.9 C)] 98.5 F (36.9 C) (04/17 1931) Pulse Rate:  [55-70] 66 (04/17 1931) Cardiac Rhythm: Normal sinus rhythm (04/17 1908) Resp:  [12-20] 19 (04/17 1931) BP: (114-161)/(49-84) 138/76 (04/17 1931) SpO2:  [95 %-98 %] 97 % (04/17 1931) Weight:  [158.9 kg] 158.9 kg (04/17 0643)  Physical Exam: BP Readings from Last 1 Encounters:  10/29/23 138/76     Wt Readings from Last 1 Encounters:  10/29/23 (!) 158.9 kg    Weight change: 3.5 kg Body mass index is 60.13 kg/m. HEENT: /AT, Eyes-Brown, Conjunctiva-Pink, Sclera-Non-icteric Neck: No JVD, No bruit, Trachea midline. Lungs:  Clear, Bilateral. Left precordial tenderness on palpation. Cardiac:  Regular rhythm, normal S1 and S2, no S3. II/VI systolic murmur. Abdomen:  Soft, non-tender. BS present. Extremities:  No edema present. No cyanosis. No clubbing. CNS: AxOx3, Cranial nerves grossly intact, moves all 4 extremities.  Skin: Warm and dry.   Intake/Output from previous day: 04/16 0701 - 04/17 0700 In: 600 [P.O.:600; I.V.:0] Out: 1450 [Urine:1450]    Lab Results: BMET    Component Value Date/Time   NA 140 10/28/2023 0725   NA 140 10/27/2023 2121   NA 141 10/28/2022 1516   K 3.6 10/28/2023 0725   K 3.6 10/27/2023 2121   K 3.2 (L) 10/28/2022 1516   CL 105 10/28/2023 0725   CL 106 10/27/2023 2121   CL 102 10/28/2022 1516   CO2 22 10/28/2023 0725   CO2 26 10/27/2023 2121   CO2 29 10/28/2022 1516   GLUCOSE 100 (H) 10/28/2023 0725   GLUCOSE 119 (H) 10/27/2023 2121   GLUCOSE 97 10/28/2022 1516   BUN 7 10/28/2023 0725   BUN 7 10/27/2023 2121   BUN 7 10/28/2022 1516   CREATININE 0.84 10/28/2023 0725   CREATININE  1.04 (H) 10/27/2023 2121   CREATININE 0.93 10/28/2022 1516   CALCIUM 8.7 (L) 10/28/2023 0725   CALCIUM 9.0 10/27/2023 2121   CALCIUM 9.0 10/28/2022 1516   GFRNONAA >60 10/28/2023 0725   GFRNONAA >60 10/27/2023 2121   GFRNONAA >60 10/28/2022 1516   GFRAA >60 05/06/2019 1723   GFRAA >60 01/08/2018 0131   GFRAA >60 10/21/2016 0504   CBC    Component Value Date/Time   WBC 4.5 10/29/2023 0228   RBC 4.38 10/29/2023 0228   HGB 11.7 (L) 10/29/2023 0228   HCT 35.7 (L) 10/29/2023 0228   PLT 249 10/29/2023 0228   MCV 81.5 10/29/2023 0228   MCH 26.7 10/29/2023 0228   MCHC 32.8 10/29/2023 0228   RDW 13.0 10/29/2023 0228   LYMPHSABS 1.8 10/27/2023 2121   MONOABS 0.4 10/27/2023 2121   EOSABS 0.1 10/27/2023 2121   BASOSABS 0.0 10/27/2023 2121   HEPATIC Function Panel Recent Labs    10/27/23 2121  PROT 7.4  ALBUMIN 3.7  AST 27  ALT 28  ALKPHOS 50   HEMOGLOBIN A1C Lab Results  Component Value Date   MPG 128.37 10/27/2023   CARDIAC ENZYMES Lab Results  Component Value Date   TROPONINI <0.03 10/17/2016   TROPONINI <0.03 10/17/2016   TROPONINI <0.03 10/17/2016   BNP No results for input(s): "PROBNP" in the last 8760 hours. TSH No results for input(s): "TSH"  in the last 8760 hours. CHOLESTEROL Recent Labs    10/28/23 0725  CHOL 147    Scheduled Meds:  amLODipine  5 mg Oral Daily   metoprolol tartrate  25 mg Oral BID   potassium chloride  10 mEq Oral Daily   rosuvastatin  20 mg Oral q1800   sodium chloride flush  3 mL Intravenous Q12H   Continuous Infusions:  heparin 1,550 Units/hr (10/29/23 0800)   PRN Meds:.acetaminophen, nitroGLYCERIN, ondansetron (ZOFRAN) IV, sodium chloride flush  Assessment/Plan: Acute coronary syndrome Possible non-cardiac chest pain Paroxysmal atrial fibrillation Morbid obesity Asthma Hypertension Type 2 DM  Plan: Discussed with patient and family about decreased likelihood of real inferior and apical wall ischemia. She will think  about waiting 2-4 weeks  with pain medications and muscle relaxant use v/s SL NTG and going for cardiac cath tomorrow.    LOS: 1 day   Time spent including chart review, lab review, examination, discussion with patient/Nurse/Radiologist/Family : 40 min   Pasqual Bone  MD  10/29/2023, 8:12 PM

## 2023-10-29 NOTE — Plan of Care (Signed)

## 2023-10-30 DIAGNOSIS — I249 Acute ischemic heart disease, unspecified: Secondary | ICD-10-CM | POA: Diagnosis not present

## 2023-10-30 DIAGNOSIS — I480 Paroxysmal atrial fibrillation: Secondary | ICD-10-CM | POA: Diagnosis not present

## 2023-10-30 DIAGNOSIS — I10 Essential (primary) hypertension: Secondary | ICD-10-CM | POA: Diagnosis not present

## 2023-10-30 DIAGNOSIS — R072 Precordial pain: Secondary | ICD-10-CM | POA: Diagnosis not present

## 2023-10-30 LAB — CBC
HCT: 34 % — ABNORMAL LOW (ref 36.0–46.0)
Hemoglobin: 11.2 g/dL — ABNORMAL LOW (ref 12.0–15.0)
MCH: 27 pg (ref 26.0–34.0)
MCHC: 32.9 g/dL (ref 30.0–36.0)
MCV: 81.9 fL (ref 80.0–100.0)
Platelets: 220 10*3/uL (ref 150–400)
RBC: 4.15 MIL/uL (ref 3.87–5.11)
RDW: 13 % (ref 11.5–15.5)
WBC: 4.5 10*3/uL (ref 4.0–10.5)
nRBC: 0 % (ref 0.0–0.2)

## 2023-10-30 LAB — HEPARIN LEVEL (UNFRACTIONATED): Heparin Unfractionated: 0.33 [IU]/mL (ref 0.30–0.70)

## 2023-10-30 MED ORDER — ROSUVASTATIN CALCIUM 20 MG PO TABS: 20.0000 mg | ORAL_TABLET | Freq: Every day | ORAL | 1 refills | Status: AC

## 2023-10-30 MED ORDER — POTASSIUM CHLORIDE CRYS ER 10 MEQ PO TBCR
10.0000 meq | EXTENDED_RELEASE_TABLET | Freq: Every day | ORAL | 3 refills | Status: AC
Start: 2023-10-30 — End: ?

## 2023-10-30 MED ORDER — APIXABAN 5 MG PO TABS: 5.0000 mg | ORAL_TABLET | Freq: Two times a day (BID) | ORAL | 6 refills | Status: AC

## 2023-10-30 MED ORDER — NITROGLYCERIN 0.4 MG SL SUBL: 0.4000 mg | SUBLINGUAL_TABLET | SUBLINGUAL | 1 refills | Status: AC | PRN

## 2023-10-30 MED ORDER — ACETAMINOPHEN 325 MG PO TABS: 650.0000 mg | ORAL_TABLET | Freq: Four times a day (QID) | ORAL | Status: AC | PRN

## 2023-10-30 MED ORDER — METOPROLOL TARTRATE 25 MG PO TABS: 12.5000 mg | ORAL_TABLET | Freq: Two times a day (BID) | ORAL | 3 refills | Status: AC

## 2023-10-30 MED ORDER — HYDROCHLOROTHIAZIDE 12.5 MG PO TABS: 12.5000 mg | ORAL_TABLET | Freq: Every day | ORAL | 3 refills | Status: AC

## 2023-10-30 MED ORDER — HYDROCHLOROTHIAZIDE 12.5 MG PO TABS
12.5000 mg | ORAL_TABLET | Freq: Every day | ORAL | Status: DC
  Filled 2023-10-30: qty 1

## 2023-10-30 MED ORDER — METOPROLOL TARTRATE 25 MG PO TABS: 25.0000 mg | ORAL_TABLET | Freq: Two times a day (BID) | ORAL | 3 refills | Status: DC

## 2023-10-30 MED ORDER — APIXABAN 5 MG PO TABS
5.0000 mg | ORAL_TABLET | Freq: Two times a day (BID) | ORAL | Status: DC
  Administered 2023-10-30: 5 mg via ORAL
  Filled 2023-10-30: qty 1

## 2023-10-30 MED ORDER — AMLODIPINE BESYLATE 5 MG PO TABS: 5.0000 mg | ORAL_TABLET | Freq: Every day | ORAL | 3 refills | Status: AC

## 2023-10-30 NOTE — Discharge Summary (Signed)
 Physician Discharge Summary  Patient ID: Sheri Park MRN: [id number] DOB/AGE: 12/28/1965 58 y.o.  Admit date: 10/27/2023 Discharge date: 10/30/2023  Admission Diagnoses: Acute coronary syndrome Paroxysmal atrial fibrillation Morbid obesity Asthma Hypertension Type 2 DM  Discharge Diagnoses:  Principal Problem:   Acute coronary syndrome (HCC) Active problems: Possible non-cardiac chest pain Paroxysmal atrial fibrillation Morbid obesity Asthma Hypertension Type 2 DM, diet controlled  Discharged Condition: fair  Hospital Course: 58 years old black female with PMH of hypertension, type 2 DM, asthma, morbid obesity, paroxysmal atrial fibrillation has chest pain and shortness of breath with activity. Her cardiac cath in 2018 showed normal coronaries. Troponin I were normal x 2. EKG showed sinus bradycardia and non-specific T wave changes. Chest x-ray was unremarkable. NM myocardial perfusion stress test showed normal LV motion as well as possible inferior, apical reversible ischemia. Patient had significant relief with oxycodone  use as chest pain was reproducible by palpation. She chose to wait with pain medication use and see me in 2 weeks to decide if she wants cardiac catheterization or continue medical treatment with life style modification.  Consults: cardiology  Significant Diagnostic Studies:  labs: Near normal CBC and BMET with mild anemia and hyperglycemia. Hgb A1C was 6.1%. Troponin I were normal x 2.  EKG showed sinus bradycardia and non-specific T wave changes.  Chest x-ray was unremarkable.  NM myocardial perfusion stress test showed normal LV motion as well as possible inferior, apical reversible ischemia.  Treatments: cardiac meds: metoprolol , amlodipine , hydrochlorothiazide , SL NTG, Eliquis , Potassium and Rosuvastatin .  Discharge Exam: Blood pressure 130/81, pulse (!) 54, temperature 98.5 F (36.9 C), temperature source Oral, resp. rate 16, height  5' 4 (1.626 m), weight (!) 155.9 kg, last menstrual period 12/15/2019, SpO2 96%. General appearance: alert, cooperative and appears stated age. Head: Normocephalic, atraumatic. Eyes: Brown eyes, pink conjunctiva, corneas clear.  Neck: No adenopathy, no carotid bruit, no JVD, supple, symmetrical, trachea midline and thyroid not enlarged. Resp: Clear to auscultation bilaterally. Cardio: Regular rate and rhythm, S1, S2 normal, II/VI systolic murmur, no click, rub or gallop. GI: Soft, non-tender; bowel sounds normal; no organomegaly. Extremities: No edema, cyanosis or clubbing. Skin: Warm and dry.  Neurologic: Alert and oriented X 3, normal strength and tone. Normal coordination and slow gait.  Disposition: Discharge disposition: 01-Home or Self Care        Allergies as of 10/30/2023       Reactions   Other Anaphylaxis, Other (See Comments)   ALLERGIC TO ALL NUTS!!!!   Peanut-containing Drug Products Anaphylaxis, Other (See Comments)   Oil, nuts, anything peanut-related   Shellfish Allergy Anaphylaxis   Tape Rash, Other (See Comments)   NO PAPER TAPE!!!   Codeine Itching   Egg-derived Products Nausea And Vomiting   Ketorolac Tromethamine Swelling   Milk-related Compounds Itching   Toradol [ketorolac Tromethamine] Swelling   Latex Rash, Other (See Comments)   Causes skin blisters   Mucinex [guaifenesin Er] Rash        Medication List     STOP taking these medications    blood glucose meter kit and supplies Kit   hydrochlorothiazide  12.5 MG capsule Commonly known as: MICROZIDE  Replaced by: hydrochlorothiazide  12.5 MG tablet   One-A-Day Womens Formula Tabs       TAKE these medications    acetaminophen  325 MG tablet Commonly known as: TYLENOL  Take 2 tablets (650 mg total) by mouth every 6 (six) hours as needed for headache or mild pain (pain score 1-3).   albuterol   108 (90 Base) MCG/ACT inhaler Commonly known as: VENTOLIN  HFA Inhale 1-2 puffs into the lungs  every 6 (six) hours as needed for wheezing or shortness of breath.   amLODipine  5 MG tablet Commonly known as: NORVASC  Take 1 tablet (5 mg total) by mouth daily. What changed: when to take this   apixaban  5 MG Tabs tablet Commonly known as: ELIQUIS  Take 1 tablet (5 mg total) by mouth 2 (two) times daily.   hydrochlorothiazide  12.5 MG tablet Commonly known as: HYDRODIURIL  Take 1 tablet (12.5 mg total) by mouth daily. Replaces: hydrochlorothiazide  12.5 MG capsule   metoprolol  tartrate 25 MG tablet Commonly known as: LOPRESSOR  Take 0.5 tablets (12.5 mg total) by mouth 2 (two) times daily.   nitroGLYCERIN  0.4 MG SL tablet Commonly known as: NITROSTAT  Place 1 tablet (0.4 mg total) under the tongue every 5 (five) minutes x 3 doses as needed for chest pain.   potassium chloride  10 MEQ tablet Commonly known as: KLOR-CON  M Take 1 tablet (10 mEq total) by mouth daily.   rosuvastatin  20 MG tablet Commonly known as: CRESTOR  Take 1 tablet (20 mg total) by mouth daily at 6 PM.        Follow-up Information     Claudene Pacific, MD Follow up in 2 week(s).   Specialty: Cardiology Contact information: [address] [place] [address] [phone]                 Time spent: Review of old chart, current chart, lab, x-ray, cardiac tests and discussion with patient over 60 minutes.  Signed: Pacific GORMAN Claudene 10/30/2023, 9:07 AM

## 2023-10-30 NOTE — Plan of Care (Signed)
 IVs removed. Patient's son coming to pick her up from the discharge lounge. AVS report provided and went over, questions answered.   CSW reached out to, no further needs for discharge.  Problem: Education: Goal: Knowledge of General Education information will improve Description: Including pain rating scale, medication(s)/side effects and non-pharmacologic comfort measures Outcome: Adequate for Discharge   Problem: Health Behavior/Discharge Planning: Goal: Ability to manage health-related needs will improve Outcome: Adequate for Discharge   Problem: Clinical Measurements: Goal: Ability to maintain clinical measurements within normal limits will improve Outcome: Adequate for Discharge Goal: Will remain free from infection Outcome: Adequate for Discharge Goal: Diagnostic test results will improve Outcome: Adequate for Discharge Goal: Respiratory complications will improve Outcome: Adequate for Discharge Goal: Cardiovascular complication will be avoided Outcome: Adequate for Discharge   Problem: Activity: Goal: Risk for activity intolerance will decrease Outcome: Adequate for Discharge   Problem: Nutrition: Goal: Adequate nutrition will be maintained Outcome: Adequate for Discharge   Problem: Coping: Goal: Level of anxiety will decrease Outcome: Adequate for Discharge   Problem: Elimination: Goal: Will not experience complications related to bowel motility Outcome: Adequate for Discharge Goal: Will not experience complications related to urinary retention Outcome: Adequate for Discharge   Problem: Pain Managment: Goal: General experience of comfort will improve and/or be controlled Outcome: Adequate for Discharge   Problem: Safety: Goal: Ability to remain free from injury will improve Outcome: Adequate for Discharge   Problem: Skin Integrity: Goal: Risk for impaired skin integrity will decrease Outcome: Adequate for Discharge   Problem: Education: Goal:  Understanding of cardiac disease, CV risk reduction, and recovery process will improve Outcome: Adequate for Discharge Goal: Individualized Educational Video(s) Outcome: Adequate for Discharge   Problem: Activity: Goal: Ability to tolerate increased activity will improve Outcome: Adequate for Discharge   Problem: Cardiac: Goal: Ability to achieve and maintain adequate cardiovascular perfusion will improve Outcome: Adequate for Discharge   Problem: Health Behavior/Discharge Planning: Goal: Ability to safely manage health-related needs after discharge will improve Outcome: Adequate for Discharge

## 2023-10-30 NOTE — Plan of Care (Signed)

## 2023-10-30 NOTE — TOC Transition Note (Signed)
 Transition of Care [facility]) - Discharge Note   Patient Details  Name: Sheri Park MRN: [id number] Date of Birth: 1966/05/08  [facility]) CM/SW Contact:  Waddell Barnie Rama, RN Phone Number: 10/30/2023, 9:38 AM   Clinical Narrative:    For dc today, patient received resources from CSW from previous note. She has transport.      Barriers to Discharge: Continued Medical Work up   Patient Goals and CMS Choice Patient states their goals for this hospitalization and ongoing recovery are:: to return home          Discharge Placement                       Discharge Plan and Services Additional resources added to the After Visit Summary for                                       Social Drivers of Health (SDOH) Interventions SDOH Screenings   Food Insecurity: Food Insecurity Present (10/27/2023)  Housing: High Risk (10/27/2023)  Transportation Needs: Unmet Transportation Needs (10/27/2023)  Utilities: Not At Risk (10/27/2023)  Social Connections: Unknown (10/27/2023)  Tobacco Use: Low Risk  (10/27/2023)  Recent Concern: Tobacco Use - Medium Risk (08/11/2023)   Received from [facility]     Readmission Risk Interventions     No data to display

## 2023-10-30 NOTE — Plan of Care (Signed)
 Problem: Education: Goal: Knowledge of General Education information will improve Description: Including pain rating scale, medication(s)/side effects and non-pharmacologic comfort measures 10/30/2023 1012 by Lorice Isaiah BIRCH, RN Outcome: Adequate for Discharge 10/30/2023 1010 by Lorice Isaiah BIRCH, RN Outcome: Adequate for Discharge   Problem: Health Behavior/Discharge Planning: Goal: Ability to manage health-related needs will improve 10/30/2023 1012 by Lorice Isaiah BIRCH, RN Outcome: Adequate for Discharge 10/30/2023 1010 by Lorice Isaiah BIRCH, RN Outcome: Adequate for Discharge   Problem: Clinical Measurements: Goal: Ability to maintain clinical measurements within normal limits will improve 10/30/2023 1012 by Lorice Isaiah BIRCH, RN Outcome: Adequate for Discharge 10/30/2023 1010 by Lorice Isaiah BIRCH, RN Outcome: Adequate for Discharge Goal: Will remain free from infection 10/30/2023 1012 by Lorice Isaiah BIRCH, RN Outcome: Adequate for Discharge 10/30/2023 1010 by Lorice Isaiah BIRCH, RN Outcome: Adequate for Discharge Goal: Diagnostic test results will improve 10/30/2023 1012 by Lorice Isaiah BIRCH, RN Outcome: Adequate for Discharge 10/30/2023 1010 by Lorice Isaiah BIRCH, RN Outcome: Adequate for Discharge Goal: Respiratory complications will improve 10/30/2023 1012 by Lorice Isaiah BIRCH, RN Outcome: Adequate for Discharge 10/30/2023 1010 by Lorice Isaiah BIRCH, RN Outcome: Adequate for Discharge Goal: Cardiovascular complication will be avoided 10/30/2023 1012 by Lorice Isaiah BIRCH, RN Outcome: Adequate for Discharge 10/30/2023 1010 by Lorice Isaiah BIRCH, RN Outcome: Adequate for Discharge   Problem: Activity: Goal: Risk for activity intolerance will decrease 10/30/2023 1012 by Lorice Isaiah BIRCH, RN Outcome: Adequate for Discharge 10/30/2023 1010 by Lorice Isaiah BIRCH, RN Outcome: Adequate for Discharge   Problem: Nutrition: Goal: Adequate nutrition will be maintained 10/30/2023 1012 by Lorice Isaiah BIRCH,  RN Outcome: Adequate for Discharge 10/30/2023 1010 by Lorice Isaiah BIRCH, RN Outcome: Adequate for Discharge   Problem: Coping: Goal: Level of anxiety will decrease 10/30/2023 1012 by Lorice Isaiah BIRCH, RN Outcome: Adequate for Discharge 10/30/2023 1010 by Lorice Isaiah BIRCH, RN Outcome: Adequate for Discharge   Problem: Elimination: Goal: Will not experience complications related to bowel motility 10/30/2023 1012 by Lorice Isaiah BIRCH, RN Outcome: Adequate for Discharge 10/30/2023 1010 by Lorice Isaiah BIRCH, RN Outcome: Adequate for Discharge Goal: Will not experience complications related to urinary retention 10/30/2023 1012 by Lorice Isaiah BIRCH, RN Outcome: Adequate for Discharge 10/30/2023 1010 by Lorice Isaiah BIRCH, RN Outcome: Adequate for Discharge   Problem: Pain Managment: Goal: General experience of comfort will improve and/or be controlled 10/30/2023 1012 by Lorice Isaiah BIRCH, RN Outcome: Adequate for Discharge 10/30/2023 1010 by Lorice Isaiah BIRCH, RN Outcome: Adequate for Discharge   Problem: Safety: Goal: Ability to remain free from injury will improve 10/30/2023 1012 by Lorice Isaiah BIRCH, RN Outcome: Adequate for Discharge 10/30/2023 1010 by Lorice Isaiah BIRCH, RN Outcome: Adequate for Discharge   Problem: Skin Integrity: Goal: Risk for impaired skin integrity will decrease 10/30/2023 1012 by Lorice Isaiah BIRCH, RN Outcome: Adequate for Discharge 10/30/2023 1010 by Lorice Isaiah BIRCH, RN Outcome: Adequate for Discharge   Problem: Education: Goal: Understanding of cardiac disease, CV risk reduction, and recovery process will improve 10/30/2023 1012 by Lorice Isaiah BIRCH, RN Outcome: Adequate for Discharge 10/30/2023 1010 by Lorice Isaiah BIRCH, RN Outcome: Adequate for Discharge Goal: Individualized Educational Video(s) 10/30/2023 1012 by Lorice Isaiah BIRCH, RN Outcome: Adequate for Discharge 10/30/2023 1010 by Lorice Isaiah BIRCH, RN Outcome: Adequate for Discharge   Problem: Activity: Goal:  Ability to tolerate increased activity will improve 10/30/2023 1012 by Lorice Isaiah BIRCH, RN Outcome: Adequate for Discharge 10/30/2023 1010 by Lorice Isaiah BIRCH, RN Outcome: Adequate for Discharge  Problem: Cardiac: Goal: Ability to achieve and maintain adequate cardiovascular perfusion will improve 10/30/2023 1012 by Lorice Isaiah BIRCH, RN Outcome: Adequate for Discharge 10/30/2023 1010 by Lorice Isaiah BIRCH, RN Outcome: Adequate for Discharge   Problem: Health Behavior/Discharge Planning: Goal: Ability to safely manage health-related needs after discharge will improve 10/30/2023 1012 by Lorice Isaiah BIRCH, RN Outcome: Adequate for Discharge 10/30/2023 1010 by Lorice Isaiah BIRCH, RN Outcome: Adequate for Discharge

## 2023-11-17 DIAGNOSIS — I480 Paroxysmal atrial fibrillation: Secondary | ICD-10-CM | POA: Diagnosis not present

## 2023-11-17 DIAGNOSIS — R072 Precordial pain: Secondary | ICD-10-CM | POA: Diagnosis not present

## 2023-11-17 DIAGNOSIS — I10 Essential (primary) hypertension: Secondary | ICD-10-CM | POA: Diagnosis not present

## 2024-01-19 DIAGNOSIS — I10 Essential (primary) hypertension: Secondary | ICD-10-CM | POA: Diagnosis not present

## 2024-01-19 DIAGNOSIS — I480 Paroxysmal atrial fibrillation: Secondary | ICD-10-CM | POA: Diagnosis not present

## 2024-01-19 DIAGNOSIS — R072 Precordial pain: Secondary | ICD-10-CM | POA: Diagnosis not present

## 2024-02-16 DIAGNOSIS — I480 Paroxysmal atrial fibrillation: Secondary | ICD-10-CM | POA: Diagnosis not present

## 2024-02-16 DIAGNOSIS — I10 Essential (primary) hypertension: Secondary | ICD-10-CM | POA: Diagnosis not present

## 2024-02-16 DIAGNOSIS — R072 Precordial pain: Secondary | ICD-10-CM | POA: Diagnosis not present
